# Patient Record
Sex: Female | Born: 1975 | Race: Black or African American | Hispanic: No | Marital: Single | Smoking: Never smoker
Health system: Southern US, Community
[De-identification: ages and names within clinical notes are randomized; demographics above are authoritative.]

## PROBLEM LIST (undated history)

## (undated) ENCOUNTER — Ambulatory Visit
Payer: MEDICARE | Attending: Student in an Organized Health Care Education/Training Program | Primary: Student in an Organized Health Care Education/Training Program

## (undated) ENCOUNTER — Encounter
Attending: Student in an Organized Health Care Education/Training Program | Primary: Student in an Organized Health Care Education/Training Program

## (undated) ENCOUNTER — Ambulatory Visit: Payer: Medicare (Managed Care)

## (undated) ENCOUNTER — Ambulatory Visit
Payer: Medicare (Managed Care) | Attending: Student in an Organized Health Care Education/Training Program | Primary: Student in an Organized Health Care Education/Training Program

## (undated) ENCOUNTER — Telehealth

## (undated) ENCOUNTER — Telehealth
Attending: Student in an Organized Health Care Education/Training Program | Primary: Student in an Organized Health Care Education/Training Program

## (undated) ENCOUNTER — Encounter

## (undated) ENCOUNTER — Encounter
Payer: Medicaid (Managed Care) | Attending: Student in an Organized Health Care Education/Training Program | Primary: Student in an Organized Health Care Education/Training Program

## (undated) ENCOUNTER — Other Ambulatory Visit

## (undated) ENCOUNTER — Ambulatory Visit: Payer: Medicare (Managed Care) | Attending: Registered" | Primary: Registered"

## (undated) ENCOUNTER — Ambulatory Visit: Payer: MEDICARE | Attending: Anesthesiology | Primary: Anesthesiology

## (undated) ENCOUNTER — Inpatient Hospital Stay: Payer: Medicare (Managed Care)

## (undated) ENCOUNTER — Ambulatory Visit

## (undated) ENCOUNTER — Encounter: Attending: Family | Primary: Family

## (undated) ENCOUNTER — Ambulatory Visit: Attending: Plastic and Reconstructive Surgery | Primary: Plastic and Reconstructive Surgery

## (undated) ENCOUNTER — Ambulatory Visit: Payer: Medicare (Managed Care) | Attending: Family | Primary: Family

## (undated) ENCOUNTER — Ambulatory Visit (HOSPITAL_COMMUNITY)

## (undated) ENCOUNTER — Ambulatory Visit: Admission: EM | Payer: Medicare Other | Source: Home / Self Care

## (undated) ENCOUNTER — Ambulatory Visit: Payer: Medicare Other

## (undated) DIAGNOSIS — I219 Acute myocardial infarction, unspecified: Secondary | ICD-10-CM

## (undated) DIAGNOSIS — E785 Hyperlipidemia, unspecified: Secondary | ICD-10-CM

## (undated) DIAGNOSIS — I10 Essential (primary) hypertension: Secondary | ICD-10-CM

## (undated) DIAGNOSIS — I509 Heart failure, unspecified: Secondary | ICD-10-CM

## (undated) DIAGNOSIS — I519 Heart disease, unspecified: Secondary | ICD-10-CM

## (undated) DIAGNOSIS — J449 Chronic obstructive pulmonary disease, unspecified: Secondary | ICD-10-CM

## (undated) DIAGNOSIS — E119 Type 2 diabetes mellitus without complications: Secondary | ICD-10-CM

## (undated) HISTORY — PX: CARDIAC SURGERY: SHX584

## (undated) HISTORY — DX: Hyperlipidemia, unspecified: E78.5

## (undated) HISTORY — PX: CORONARY ARTERY BYPASS GRAFT: SHX141

## (undated) HISTORY — PX: ABDOMINAL HYSTERECTOMY: SHX81

## (undated) HISTORY — DX: Heart disease, unspecified: I51.9

---

## 2020-05-29 LAB — HM DIABETES EYE EXAM

## 2020-10-11 ENCOUNTER — Inpatient Hospital Stay
Admission: EM | Admit: 2020-10-11 | Discharge: 2020-10-13 | DRG: 194 | Disposition: A | Discharge status: Home / Self Care | Payer: Medicare Other | Attending: Internal Medicine | Admitting: Internal Medicine

## 2020-10-11 ENCOUNTER — Emergency Department: Payer: Medicare Other

## 2020-10-11 ENCOUNTER — Other Ambulatory Visit: Payer: Self-pay

## 2020-10-11 DIAGNOSIS — I2510 Atherosclerotic heart disease of native coronary artery without angina pectoris: Secondary | ICD-10-CM | POA: Diagnosis not present

## 2020-10-11 DIAGNOSIS — Z20822 Contact with and (suspected) exposure to covid-19: Secondary | ICD-10-CM | POA: Diagnosis present

## 2020-10-11 DIAGNOSIS — J189 Pneumonia, unspecified organism: Secondary | ICD-10-CM | POA: Diagnosis present

## 2020-10-11 DIAGNOSIS — J9811 Atelectasis: Secondary | ICD-10-CM | POA: Diagnosis not present

## 2020-10-11 DIAGNOSIS — Z888 Allergy status to other drugs, medicaments and biological substances status: Secondary | ICD-10-CM | POA: Diagnosis not present

## 2020-10-11 DIAGNOSIS — E785 Hyperlipidemia, unspecified: Secondary | ICD-10-CM | POA: Diagnosis present

## 2020-10-11 DIAGNOSIS — Z7982 Long term (current) use of aspirin: Secondary | ICD-10-CM | POA: Diagnosis not present

## 2020-10-11 DIAGNOSIS — Y95 Nosocomial condition: Secondary | ICD-10-CM | POA: Diagnosis present

## 2020-10-11 DIAGNOSIS — Z79899 Other long term (current) drug therapy: Secondary | ICD-10-CM | POA: Diagnosis not present

## 2020-10-11 DIAGNOSIS — E876 Hypokalemia: Secondary | ICD-10-CM | POA: Diagnosis present

## 2020-10-11 DIAGNOSIS — I10 Essential (primary) hypertension: Secondary | ICD-10-CM | POA: Diagnosis present

## 2020-10-11 DIAGNOSIS — E1159 Type 2 diabetes mellitus with other circulatory complications: Secondary | ICD-10-CM

## 2020-10-11 DIAGNOSIS — K219 Gastro-esophageal reflux disease without esophagitis: Secondary | ICD-10-CM | POA: Diagnosis present

## 2020-10-11 DIAGNOSIS — E1141 Type 2 diabetes mellitus with diabetic mononeuropathy: Secondary | ICD-10-CM | POA: Diagnosis present

## 2020-10-11 DIAGNOSIS — E119 Type 2 diabetes mellitus without complications: Secondary | ICD-10-CM

## 2020-10-11 DIAGNOSIS — J440 Chronic obstructive pulmonary disease with acute lower respiratory infection: Secondary | ICD-10-CM | POA: Diagnosis present

## 2020-10-11 DIAGNOSIS — F419 Anxiety disorder, unspecified: Secondary | ICD-10-CM | POA: Diagnosis present

## 2020-10-11 DIAGNOSIS — J986 Disorders of diaphragm: Secondary | ICD-10-CM

## 2020-10-11 DIAGNOSIS — Z794 Long term (current) use of insulin: Secondary | ICD-10-CM

## 2020-10-11 DIAGNOSIS — J181 Lobar pneumonia, unspecified organism: Secondary | ICD-10-CM | POA: Diagnosis present

## 2020-10-11 DIAGNOSIS — Z951 Presence of aortocoronary bypass graft: Secondary | ICD-10-CM

## 2020-10-11 DIAGNOSIS — Z6841 Body Mass Index (BMI) 40.0 and over, adult: Secondary | ICD-10-CM

## 2020-10-11 DIAGNOSIS — Z7901 Long term (current) use of anticoagulants: Secondary | ICD-10-CM

## 2020-10-11 DIAGNOSIS — R1013 Epigastric pain: Secondary | ICD-10-CM

## 2020-10-11 HISTORY — DX: Chronic obstructive pulmonary disease, unspecified: J44.9

## 2020-10-11 HISTORY — DX: Type 2 diabetes mellitus without complications: E11.9

## 2020-10-11 HISTORY — DX: Essential (primary) hypertension: I10

## 2020-10-11 HISTORY — DX: Heart failure, unspecified: I50.9

## 2020-10-11 LAB — HEPATIC FUNCTION PANEL
ALT: 10 U/L (ref 0–44)
AST: 13 U/L — ABNORMAL LOW (ref 15–41)
Albumin: 3.7 g/dL (ref 3.5–5.0)
Alkaline Phosphatase: 60 U/L (ref 38–126)
Bilirubin, Direct: 0.2 mg/dL (ref 0.0–0.2)
Indirect Bilirubin: 1.1 mg/dL — ABNORMAL HIGH (ref 0.3–0.9)
Total Bilirubin: 1.3 mg/dL — ABNORMAL HIGH (ref 0.3–1.2)
Total Protein: 7.6 g/dL (ref 6.5–8.1)

## 2020-10-11 LAB — CBC
HCT: 32.9 % — ABNORMAL LOW (ref 36.0–46.0)
HCT: 29.1 % — ABNORMAL LOW (ref 36.0–46.0)
Hemoglobin: 11.4 g/dL — ABNORMAL LOW (ref 12.0–15.0)
Hemoglobin: 10 g/dL — ABNORMAL LOW (ref 12.0–15.0)
MCH: 30 pg (ref 26.0–34.0)
MCH: 30.1 pg (ref 26.0–34.0)
MCHC: 34.7 g/dL (ref 30.0–36.0)
MCHC: 34.4 g/dL (ref 30.0–36.0)
MCV: 86.6 fL (ref 80.0–100.0)
MCV: 87.7 fL (ref 80.0–100.0)
Platelets: 291 10*3/uL (ref 150–400)
Platelets: 241 10*3/uL (ref 150–400)
RBC: 3.8 MIL/uL — ABNORMAL LOW (ref 3.87–5.11)
RBC: 3.32 MIL/uL — ABNORMAL LOW (ref 3.87–5.11)
RDW: 15.3 % (ref 11.5–15.5)
RDW: 15.2 % (ref 11.5–15.5)
WBC: 10.4 10*3/uL (ref 4.0–10.5)
WBC: 8.8 10*3/uL (ref 4.0–10.5)
nRBC: 0 % (ref 0.0–0.2)
nRBC: 0 % (ref 0.0–0.2)

## 2020-10-11 LAB — BASIC METABOLIC PANEL
Anion gap: 9 (ref 5–15)
BUN: 12 mg/dL (ref 6–20)
CO2: 27 mmol/L (ref 22–32)
Calcium: 9.3 mg/dL (ref 8.9–10.3)
Chloride: 101 mmol/L (ref 98–111)
Creatinine, Ser: 0.85 mg/dL (ref 0.44–1.00)
GFR, Estimated: >60 mL/min (ref >60)
Glucose, Bld: 183 mg/dL — ABNORMAL HIGH (ref 70–99)
Potassium: 3.2 mmol/L — ABNORMAL LOW (ref 3.5–5.1)
Sodium: 137 mmol/L (ref 135–145)

## 2020-10-11 LAB — GLUCOSE, CAPILLARY: Glucose-Capillary: 142 mg/dL — ABNORMAL HIGH (ref 70–99)

## 2020-10-11 LAB — RESP PANEL BY RT-PCR (FLU A&B, COVID) ARPGX2
Influenza A by PCR: NEGATIVE
Influenza B by PCR: NEGATIVE
SARS Coronavirus 2 by RT PCR: NEGATIVE

## 2020-10-11 LAB — TROPONIN I (HIGH SENSITIVITY)
Troponin I (High Sensitivity): 31 ng/L — ABNORMAL HIGH (ref <18)
Troponin I (High Sensitivity): 31 ng/L — ABNORMAL HIGH (ref <18)

## 2020-10-11 LAB — CREATININE, SERUM
Creatinine, Ser: 0.84 mg/dL (ref 0.44–1.00)
GFR, Estimated: >60 mL/min (ref >60)

## 2020-10-11 LAB — LIPASE, BLOOD: Lipase: 22 U/L (ref 11–51)

## 2020-10-11 MED ORDER — MORPHINE SULFATE (PF) 2 MG/ML IV SOLN
2.0000 mg | INTRAVENOUS | Status: DC | PRN
  Administered 2020-10-11 – 2020-10-12 (×3): 2 mg via INTRAVENOUS
  Filled 2020-10-11 (×3): qty 1

## 2020-10-11 MED ORDER — SODIUM CHLORIDE 0.9 % IV SOLN
2.0000 g | INTRAVENOUS | Status: DC
  Administered 2020-10-11: 2 g via INTRAVENOUS
  Filled 2020-10-11: qty 20
  Filled 2020-10-11: qty 2

## 2020-10-11 MED ORDER — FAMOTIDINE IN NACL 20-0.9 MG/50ML-% IV SOLN
20.0000 mg | Freq: Once | INTRAVENOUS | Status: AC
  Administered 2020-10-11: 20 mg via INTRAVENOUS
  Filled 2020-10-11: qty 50

## 2020-10-11 MED ORDER — CLOPIDOGREL BISULFATE 75 MG PO TABS
75.0000 mg | ORAL_TABLET | Freq: Every day | ORAL | Status: DC
  Administered 2020-10-12 – 2020-10-13 (×2): 75 mg via ORAL
  Filled 2020-10-11 (×2): qty 1

## 2020-10-11 MED ORDER — FAMOTIDINE 20 MG PO TABS
20.0000 mg | ORAL_TABLET | Freq: Two times a day (BID) | ORAL | Status: DC
  Administered 2020-10-12 – 2020-10-13 (×3): 20 mg via ORAL
  Filled 2020-10-11 (×3): qty 1

## 2020-10-11 MED ORDER — CARVEDILOL 25 MG PO TABS
25.0000 mg | ORAL_TABLET | Freq: Two times a day (BID) | ORAL | Status: DC
  Administered 2020-10-12 – 2020-10-13 (×3): 25 mg via ORAL
  Filled 2020-10-11 (×3): qty 1

## 2020-10-11 MED ORDER — ENOXAPARIN SODIUM 60 MG/0.6ML IJ SOSY
0.5000 mg/kg | PREFILLED_SYRINGE | INTRAMUSCULAR | Status: DC
  Administered 2020-10-11: 55 mg via SUBCUTANEOUS
  Filled 2020-10-11 (×2): qty 0.6

## 2020-10-11 MED ORDER — ONDANSETRON HCL 4 MG/2ML IJ SOLN
4.0000 mg | Freq: Once | INTRAMUSCULAR | Status: AC
  Administered 2020-10-11: 4 mg via INTRAVENOUS
  Filled 2020-10-11: qty 2

## 2020-10-11 MED ORDER — VANCOMYCIN HCL 1250 MG/250ML IV SOLN
1250.0000 mg | Freq: Once | INTRAVENOUS | Status: AC
  Administered 2020-10-11: 1250 mg via INTRAVENOUS
  Filled 2020-10-11: qty 250

## 2020-10-11 MED ORDER — ASPIRIN 81 MG PO CHEW
81.0000 mg | CHEWABLE_TABLET | Freq: Every day | ORAL | Status: DC
  Administered 2020-10-12 – 2020-10-13 (×2): 81 mg via ORAL
  Filled 2020-10-11 (×2): qty 1

## 2020-10-11 MED ORDER — INSULIN ASPART 100 UNIT/ML IJ SOLN
2.0000 [IU] | Freq: Three times a day (TID) | INTRAMUSCULAR | Status: DC
  Administered 2020-10-12 (×3): 2 [IU] via SUBCUTANEOUS
  Filled 2020-10-11 (×3): qty 1

## 2020-10-11 MED ORDER — MORPHINE SULFATE (PF) 4 MG/ML IV SOLN
6.0000 mg | Freq: Once | INTRAVENOUS | Status: AC
  Administered 2020-10-11: 6 mg via INTRAVENOUS
  Filled 2020-10-11: qty 2

## 2020-10-11 MED ORDER — POTASSIUM CHLORIDE CRYS ER 20 MEQ PO TBCR
40.0000 meq | EXTENDED_RELEASE_TABLET | Freq: Once | ORAL | Status: AC
  Administered 2020-10-11: 40 meq via ORAL
  Filled 2020-10-11: qty 2

## 2020-10-11 MED ORDER — IOHEXOL 350 MG/ML SOLN
75.0000 mL | Freq: Once | INTRAVENOUS | Status: AC | PRN
  Administered 2020-10-11: 75 mL via INTRAVENOUS

## 2020-10-11 MED ORDER — INSULIN ASPART 100 UNIT/ML IJ SOLN
0.0000 [IU] | Freq: Three times a day (TID) | INTRAMUSCULAR | Status: DC
  Administered 2020-10-12: 1 [IU] via SUBCUTANEOUS
  Administered 2020-10-12: 2 [IU] via SUBCUTANEOUS
  Administered 2020-10-12: 1 [IU] via SUBCUTANEOUS
  Administered 2020-10-13: 3 [IU] via SUBCUTANEOUS
  Filled 2020-10-11 (×4): qty 1

## 2020-10-11 MED ORDER — EZETIMIBE 10 MG PO TABS
10.0000 mg | ORAL_TABLET | Freq: Every day | ORAL | Status: DC
  Administered 2020-10-12 – 2020-10-13 (×2): 10 mg via ORAL
  Filled 2020-10-11 (×2): qty 1

## 2020-10-11 MED ORDER — ATORVASTATIN CALCIUM 20 MG PO TABS
80.0000 mg | ORAL_TABLET | Freq: Every day | ORAL | Status: DC
  Administered 2020-10-12 – 2020-10-13 (×2): 80 mg via ORAL
  Filled 2020-10-11 (×2): qty 4

## 2020-10-11 MED ORDER — RANOLAZINE ER 500 MG PO TB12
1000.0000 mg | ORAL_TABLET | Freq: Two times a day (BID) | ORAL | Status: DC
  Administered 2020-10-12 – 2020-10-13 (×3): 1000 mg via ORAL
  Filled 2020-10-11 (×5): qty 2

## 2020-10-11 MED ORDER — ACETAMINOPHEN 325 MG PO TABS
650.0000 mg | ORAL_TABLET | Freq: Four times a day (QID) | ORAL | Status: DC | PRN
  Administered 2020-10-12 – 2020-10-13 (×2): 650 mg via ORAL
  Filled 2020-10-11 (×2): qty 2

## 2020-10-11 MED ORDER — PANTOPRAZOLE SODIUM 40 MG IV SOLR
40.0000 mg | Freq: Once | INTRAVENOUS | Status: AC
  Administered 2020-10-11: 40 mg via INTRAVENOUS
  Filled 2020-10-11: qty 40

## 2020-10-11 MED ORDER — SENNA 8.6 MG PO TABS
1.0000 | ORAL_TABLET | Freq: Every day | ORAL | Status: DC
  Administered 2020-10-12: 8.6 mg via ORAL
  Filled 2020-10-11: qty 1

## 2020-10-11 MED ORDER — SODIUM CHLORIDE 0.9 % IV SOLN
2.0000 g | Freq: Once | INTRAVENOUS | Status: AC
  Administered 2020-10-11: 2 g via INTRAVENOUS
  Filled 2020-10-11: qty 2

## 2020-10-11 MED ORDER — HYDROXYZINE HCL 10 MG PO TABS
10.0000 mg | ORAL_TABLET | Freq: Three times a day (TID) | ORAL | Status: DC | PRN
  Administered 2020-10-12: 10 mg via ORAL
  Filled 2020-10-11 (×2): qty 1

## 2020-10-11 MED ORDER — ALBUTEROL SULFATE (2.5 MG/3ML) 0.083% IN NEBU
2.5000 mg | INHALATION_SOLUTION | Freq: Four times a day (QID) | RESPIRATORY_TRACT | Status: DC | PRN
  Administered 2020-10-12: 2.5 mg via RESPIRATORY_TRACT
  Filled 2020-10-11: qty 3

## 2020-10-11 MED ORDER — BUMETANIDE 1 MG PO TABS
2.0000 mg | ORAL_TABLET | Freq: Two times a day (BID) | ORAL | Status: DC
  Administered 2020-10-12 – 2020-10-13 (×3): 2 mg via ORAL
  Filled 2020-10-11 (×4): qty 2

## 2020-10-11 MED ORDER — CARVEDILOL 25 MG PO TABS
25.0000 mg | ORAL_TABLET | Freq: Two times a day (BID) | ORAL | Status: DC
  Administered 2020-10-11: 25 mg via ORAL
  Filled 2020-10-11: qty 1

## 2020-10-11 MED ORDER — PANTOPRAZOLE SODIUM 40 MG PO TBEC
40.0000 mg | DELAYED_RELEASE_TABLET | Freq: Two times a day (BID) | ORAL | Status: DC
  Administered 2020-10-12 – 2020-10-13 (×3): 40 mg via ORAL
  Filled 2020-10-11 (×3): qty 1

## 2020-10-11 MED ORDER — AZITHROMYCIN 500 MG IV SOLR
500.0000 mg | INTRAVENOUS | Status: DC
Start: 2020-10-11 — End: 2020-10-12
  Administered 2020-10-12: 500 mg via INTRAVENOUS
  Filled 2020-10-11 (×2): qty 500

## 2020-10-11 MED ORDER — MAGNESIUM OXIDE -MG SUPPLEMENT 400 (240 MG) MG PO TABS
400.0000 mg | ORAL_TABLET | Freq: Every day | ORAL | Status: DC
  Administered 2020-10-12 – 2020-10-13 (×2): 400 mg via ORAL
  Filled 2020-10-11 (×2): qty 1

## 2020-10-11 MED ORDER — ALBUTEROL SULFATE HFA 108 (90 BASE) MCG/ACT IN AERS: 2.0000 | INHALATION_SPRAY | Freq: Four times a day (QID) | RESPIRATORY_TRACT | Status: DC | PRN

## 2020-10-11 NOTE — ED Provider Notes (Signed)
[facility] Emergency Department Provider Note ____________________________________________   Event Date/Time   First MD Initiated Contact with Patient 10/11/20 1327     (approximate)  I have reviewed the triage vital signs and the nursing notes.  HISTORY  Chief Complaint Chest Pain and Shortness of Breath   HPI Kayla Caldwell is a 45 y.o. femalewho presents to the ED for evaluation of chest pain, SOB  Chart review indicates recent CABG x4 on 6/21 at [facility]. Associated postop seroma requiring drain, since removed.  Patient just finished cefpodoxime 3 weeks ago. DAPT w plavix, HTN, HLD, DM on insulin, GERD.   Patient presents to the ED for evaluation of 1 day of spasming upper abdominal/lower chest pain.  She reports a cough with minimal sputum production over the past couple days.  She reports pain to her upper abdomen at the midline with associated nausea and poor p.o. intake without emesis.  Denies diarrhea or stool changes, denies lower abdominal pain or dysuria.  Denies any substernal chest pain or radiation of the pain, such as to her back.  Denies fevers, syncopal episodes or falls.  Past Medical History:  Diagnosis Date   COPD (chronic obstructive pulmonary disease) ([place])    Diabetes ([place])    Heart failure ([place])    Hypertension     There are no problems to display for this patient.    Prior to Admission medications   Not on File    Allergies Compazine [prochlorperazine], Naproxen, Nitroglycerin, Toradol [ketorolac tromethamine], and Tramadol  No family history on file.  Social History    Review of Systems  Constitutional: No fever/chills.  Positive generalized weakness and poor appetite. Eyes: No visual changes. ENT: No sore throat. Cardiovascular: Denies chest pain. Respiratory: Positive for cough Gastrointestinal: Positive for abdominal pain and nausea  no vomiting.  No diarrhea.  No constipation. Genitourinary: Negative for  dysuria. Musculoskeletal: Negative for back pain. Skin: Negative for rash. Neurological: Negative for headaches, focal weakness or numbness.  ____________________________________________   PHYSICAL EXAM:  VITAL SIGNS: Vitals:   10/11/20 1504 10/11/20 1530  BP: (!) 203/111 (!) 178/108  Pulse: 76 88  Resp: 20 (!) 28  Temp:    SpO2: 96% 97%    Constitutional: Alert and oriented.  Obese.  Appears quite uncomfortable.  A couple times during my evaluation she has the spasms of pain that doubles her over and she reports severe epigastric/pain around her xiphoid. Eyes: Conjunctivae are normal. PERRL. EOMI. Head: Atraumatic. Nose: No congestion/rhinnorhea. Mouth/Throat: Mucous membranes are moist.  Oropharynx non-erythematous. Neck: No stridor. No cervical spine tenderness to palpation. Cardiovascular: Normal rate, regular rhythm. Grossly normal heart sounds.  Good peripheral circulation. Respiratory: Tachypneic to about 30 without further signs of distress.  Respiratory crackles of the left base is noted, otherwise clear.  No wheezing. Gastrointestinal: Soft , nondistended,. No CVA tenderness. Epigastric tenderness to palpation with voluntary guarding.  Lower abdomen is benign. Musculoskeletal: No lower extremity tenderness nor edema.  No joint effusions. No signs of acute trauma. Neurologic:  Normal speech and language. No gross focal neurologic deficits are appreciated.  Skin:  Skin is warm, dry and intact. No rash noted. Psychiatric: Mood and affect are normal. Speech and behavior are normal. ____________________________________________   LABS (all labs ordered are listed, but only abnormal results are displayed)  Labs Reviewed  BASIC METABOLIC PANEL - Abnormal; Notable for the following components:      Result Value   Potassium 3.2 (*)    Glucose,  Bld 183 (*)    All other components within normal limits  CBC - Abnormal; Notable for the following components:   RBC 3.80 (*)     Hemoglobin 11.4 (*)    HCT 32.9 (*)    All other components within normal limits  HEPATIC FUNCTION PANEL - Abnormal; Notable for the following components:   AST 13 (*)    Total Bilirubin 1.3 (*)    Indirect Bilirubin 1.1 (*)    All other components within normal limits  TROPONIN I (HIGH SENSITIVITY) - Abnormal; Notable for the following components:   Troponin I (High Sensitivity) 31 (*)    All other components within normal limits  RESP PANEL BY RT-PCR (FLU A&B, COVID) ARPGX2  LIPASE, BLOOD  POC URINE PREG, ED  TROPONIN I (HIGH SENSITIVITY)   ____________________________________________  12 Lead EKG Sinus rhythm with a rate of 82 bpm.  Normal axis.  Prolonged QTC at 502 ms and otherwise normal intervals.  No STEMI.  Some nonspecific ST changes laterally and inferiorly.  No comparison ____________________________________________  RADIOLOGY  ED MD interpretation: 2 view CXR reviewed by me with LLL opacity superimposed on cardiomegaly  Official radiology report(s): DG Chest 2 View  Result Date: 10/11/2020 CLINICAL DATA:  Shortness of breath, chest pain. EXAM: CHEST - 2 VIEW COMPARISON:  None. FINDINGS: Mild cardiomegaly. Central pulmonary vascular congestion and mild bilateral interstitial prominence, presumably interstitial edema. Dense opacity at the LEFT lung base. No pneumothorax is seen. Median sternotomy wires appear intact and appropriately positioned. No acute-appearing osseous abnormality. IMPRESSION: 1. Cardiomegaly with mild CHF/volume overload. 2. Dense opacity at the LEFT lung base, atelectasis versus pneumonia, with probable small pleural effusion. Electronically Signed   By: Franki Cabot M.D.   On: 10/11/2020 13:47   CT ABDOMEN PELVIS W CONTRAST  Result Date: 10/11/2020 CLINICAL DATA:  Severe epigastric pain, emesis, chest pain. Evaluate for small bowel obstruction. EXAM: CT ABDOMEN AND PELVIS WITH CONTRAST TECHNIQUE: Multidetector CT imaging of the abdomen and pelvis  was performed using the standard protocol following bolus administration of intravenous contrast. CONTRAST:  55m OMNIPAQUE IOHEXOL 350 MG/ML SOLN COMPARISON:  None. FINDINGS: Lower chest: Dense consolidation at the LEFT lung base, with surrounding small pleural effusion. Additional small RIGHT pleural effusion. Small pericardial effusion, incompletely imaged. Hepatobiliary: No focal liver abnormality is seen. Gallbladder is slightly dense, possibly containing sludge. No pericholecystic fluid seen. No bile duct dilatation seen. Pancreas: Unremarkable.  No peripancreatic fluid. Spleen: Normal in size without focal abnormality. Adrenals/Urinary Tract: Adrenal glands are unremarkable. Kidneys are unremarkable without mass, stone or hydronephrosis. No ureteral or bladder calculi identified. Bladder is unremarkable, partially decompressed. Stomach/Bowel: No dilated large or small bowel loops. No evidence of bowel wall inflammation. Appendix is normal. Stomach is unremarkable. Vascular/Lymphatic: Aortic atherosclerosis. No acute-appearing vascular abnormality. No enlarged lymph nodes seen in the abdomen or pelvis. Reproductive: Presumed hysterectomy.  No adnexal mass or free fluid. Other: No free fluid or abscess collection. No free intraperitoneal air. Musculoskeletal: Mild degenerative spondylosis of the thoracolumbar spine. No acute-appearing osseous abnormality. IMPRESSION: 1. Dense consolidation at the LEFT lung base. This could represent pneumonia, aspiration or atelectasis. Small bilateral pleural effusions. 2. Small pericardial effusion, incompletely imaged. 3. No acute findings within the abdomen or pelvis. No bowel obstruction or evidence of bowel wall inflammation. No evidence of acute solid organ abnormality. No renal or ureteral calculi. Appendix is normal. Aortic Atherosclerosis (ICD10-I70.0). Electronically Signed   By: SFranki CabotM.D.   On: 10/11/2020 15:34  ____________________________________________   PROCEDURES and INTERVENTIONS  Procedure(s) performed (including Critical Care):  .1-3 Lead EKG Interpretation  Date/Time: 10/11/2020 2:55 PM Performed by: Vladimir Crofts, MD Authorized by: Vladimir Crofts, MD     Interpretation: normal     ECG rate:  80   ECG rate assessment: normal     Rhythm: sinus rhythm     Ectopy: none     Conduction: normal    Medications  ceFEPIme (MAXIPIME) 2 g in sodium chloride 0.9 % 100 mL IVPB (has no administration in time range)  vancomycin (VANCOREADY) IVPB 1250 mg/250 mL (has no administration in time range)  carvedilol (COREG) tablet 25 mg (25 mg Oral Given 10/11/20 1505)  ondansetron (ZOFRAN) injection 4 mg (4 mg Intravenous Given 10/11/20 1353)  morphine 4 MG/ML injection 6 mg (6 mg Intravenous Given 10/11/20 1440)  pantoprazole (PROTONIX) injection 40 mg (40 mg Intravenous Given 10/11/20 1445)  famotidine (PEPCID) IVPB 20 mg premix (0 mg Intravenous Stopped 10/11/20 1535)  iohexol (OMNIPAQUE) 350 MG/ML injection 75 mL (75 mLs Intravenous Contrast Given 10/11/20 1523)    ____________________________________________   MDM / ED COURSE   Pleasant 45 year old woman about 2 months out from recent CABG presents to the ED with chest/epigastric pain as well as shortness of breath and cough, with evidence of HCAP and required medical admission.  She is quite tachypneic, but has no hypoxia.  Does have crackles to her left base, otherwise clear lungs without wheezing.  Significant epigastric tenderness to palpation without peritoneal features.  Lower abdomen is benign.  Blood work without leukocytosis or significant acute derangements.  EKG is nonischemic and no evidence of ACS.  No evidence of pancreatitis.  Due to abdominal tenderness, CT obtained and demonstrates no evidence of acute intra-abdominal pathology, and redemonstrates left lower lobe pulmonary infiltrate as the likely etiology of her symptoms.  I suspect  diaphragmatic inflammation and irritation from this infiltrate contributing to her poorly localizing pain.  Due to recent antibiotic courses and admissions related to her CABG and associated complications, she qualifies for HCAP and would likely benefit from medical admission and IV antibiotics.  Clinical Course as of 10/11/20 1539  Sun Oct 11, 2020  1451 Discussed the patient my concern for HCAP considering her recent antibiotic courses and admissions.  We discussed need for IV antibiotics and we discussed possible coexisting intra-abdominal pathology and need for CT scan to better elucidate this.  She is in agreement with plan of care.  Just requesting pain medications due to her significant discomfort. [DS]  [id number] Reassessed.  Improving pain after morphine and H2 blockers.  Awaiting CT abdomen/pelvis.  Patient will be signed out to oncoming provider to follow-up on the study [DS]    Clinical Course User Index [DS] Vladimir Crofts, MD    ____________________________________________   FINAL CLINICAL IMPRESSION(S) / ED DIAGNOSES  Final diagnoses:  HCAP (healthcare-associated pneumonia)  Epigastric pain     ED Discharge Orders     None        Gladys Gutman Tamala Julian   Note:  This document was prepared using Dragon voice recognition software and may include unintentional dictation errors.    Vladimir Crofts, MD 10/11/20 [phone]

## 2020-10-11 NOTE — ED Notes (Signed)
Pt in xray. Janace Hoard, EDT made Leonette Most, RN aware.

## 2020-10-11 NOTE — ED Notes (Signed)
Hospitalist at bedside 

## 2020-10-11 NOTE — ED Notes (Signed)
IV attempted x1 unsuccessful

## 2020-10-11 NOTE — Progress Notes (Signed)
PHARMACIST - PHYSICIAN COMMUNICATION  CONCERNING:  Enoxaparin (Lovenox) for DVT Prophylaxis    RECOMMENDATION: Patient was prescribed enoxaprin '40mg'$  q24 hours for VTE prophylaxis.   Filed Weights   10/11/20 1307  Weight: 108.9 kg (240 lb)    Body mass index is 41.85 kg/m.  Estimated Creatinine Clearance: 97.8 mL/min (by C-G formula based on SCr of 0.85 mg/dL).   Based on [place] patient is candidate for enoxaparin 0.'5mg'$ /kg TBW SQ every 24 hours based on BMI being >30.  DESCRIPTION: Pharmacy has adjusted enoxaparin dose per [facility] policy.  Patient is now receiving enoxaparin 0.5 mg/hr every 24 hours   Renda Rolls, PharmD, [facility] 10/11/2020 10:48 PM

## 2020-10-11 NOTE — ED Triage Notes (Addendum)
Pt arrives via pov with c/o chest pain starting around 0530. Non radiating where tube was placed previously for cabag completed June 21st, 2022. Tube was removed prior to d/c from hospital after procedure. Pt reports difficulty and pain when taking deep breath. Reports nausea.

## 2020-10-11 NOTE — ED Notes (Signed)
Ct aware pt had her pain meds

## 2020-10-11 NOTE — ED Notes (Signed)
Pt comes with c/o substernal pain. Pt denies any CP or SOb. Pt states recent CABG in June. Pt states where the tube was it is painful and sore to touch.

## 2020-10-11 NOTE — ED Notes (Signed)
Pt resting in bed watching TV. Family at bedside

## 2020-10-11 NOTE — H&P (Signed)
History and Physical  Kayla Caldwell [id number] DOB: [birth year] DOA: 10/11/2020  Referring physician: Dr. Tamala Julian PCP: Pcp, No  Outpatient Specialists: None Patient coming from: Home & is able to ambulate   Chief Complaint: Chest pain with shortness of breath  HPI: Kayla Caldwell is a 45 y.o. female with medical history significant for coronary artery disease status post CABG x4 on August 18, 2020 at [facility], history of postop seroma that was drained, and she had just completed her antibiotics 3 weeks ago with cefpodoxime, COPD, heart failure, hypertension, diabetes melitis on insulin, GERD, hyperlipidemia,  DAPT with Plavix, who presented to the emergency department having relocated to [facility] from [place] with 1 day history of spasm of the upper abdomen and chest pain.  This was associated with cough with minimal sputum production over the past couple of days.  She complained of upper abdominal pain that was associated with nausea and anorexia denies any hematemesis denies any emesis diarrhea or stool changes.  She denies any fever or syncopal episode.  ED Course: Patient was noted to have blood pressure of 203/111 in the ED CT scan of the chest was done and it showed left basilar infiltrate patient was started on vancomycin and ceftriaxone.  She was also started on oxygen at 2 L  Review of Systems: . Pt complains of chest pain shortness of breath review of systems are otherwise negative   Past Medical History:  Diagnosis Date   COPD (chronic obstructive pulmonary disease) (HCC)    Diabetes ([place])    Heart failure ([place])    Hypertension     Social History:  has no history on file for tobacco use, alcohol use, and drug use.   Allergies  Allergen Reactions   Compazine [Prochlorperazine]    Naproxen    Nitroglycerin    Toradol [Ketorolac Tromethamine]    Tramadol     No family history on file.    Prior to Admission medications    Medication Sig Start Date End Date Taking? Authorizing Provider  albuterol (VENTOLIN HFA) 108 (90 Base) MCG/ACT inhaler Inhale 2 puffs into the lungs every 6 (six) hours as needed for wheezing or shortness of breath.   Yes [provider]  aspirin 81 MG chewable tablet Chew 81 mg by mouth daily.   Yes [provider]  atorvastatin (LIPITOR) 80 MG tablet Take 80 mg by mouth daily.   Yes [provider]  bumetanide (BUMEX) 2 MG tablet Take 2 mg by mouth 2 (two) times daily.   Yes [provider]  carvedilol (COREG) 25 MG tablet Take 25 mg by mouth 2 (two) times daily with a meal.   Yes [provider]  clopidogrel (PLAVIX) 75 MG tablet Take 75 mg by mouth daily.   Yes [provider]  ezetimibe (ZETIA) 10 MG tablet Take 10 mg by mouth daily.   Yes [provider]  famotidine (PEPCID) 20 MG tablet Take 20 mg by mouth 2 (two) times daily.   Yes [provider]  hydrOXYzine (ATARAX/VISTARIL) 10 MG tablet Take 10 mg by mouth every 8 (eight) hours as needed for anxiety, itching or sleep.   Yes [provider]  insulin glargine (LANTUS) 100 UNIT/ML injection Inject 5 Units into the skin at bedtime.   Yes [provider]  insulin lispro (HUMALOG) 100 UNIT/ML injection Inject 2 Units into the skin 3 (three) times daily before meals.   Yes [provider]  magnesium oxide (MAG-OX) 400  MG tablet Take 400 mg by mouth daily.   Yes [provider]  pantoprazole (PROTONIX) 40 MG tablet Take 40 mg by mouth 2 (two) times daily.   Yes [provider]  potassium chloride (MICRO-K) 10 MEQ CR capsule Take 10 mEq by mouth daily.   Yes [provider]  ranolazine (RANEXA) 1000 MG SR tablet Take 1,000 mg by mouth 2 (two) times daily.   Yes [provider]  senna (SENOKOT) 8.6 MG TABS tablet Take 1 tablet by mouth at bedtime.   Yes [provider]    Physical Exam: BP (!)  155/106   Pulse 74   Temp 99.1 F (37.3 C) (Oral)   Resp 19   Ht 5' 3.5" (1.613 m)   Wt 108.9 kg   SpO2 100%   BMI 41.85 kg/m   Exam:  General: 45 y.o. year-old female well developed well nourished in no acute distress.  Alert and oriented x3.  Overweight Cardiovascular: Regular rate and rhythm with no rubs or gallops.  No thyromegaly or JVD noted.   Respiratory: Clear to auscultation with no wheezes or rales. Good inspiratory effort. Abdomen: Soft nontender nondistended with normal bowel sounds x4 quadrants. Musculoskeletal: No lower extremity edema. 2/4 pulses in all 4 extremities. Skin: No ulcerative lesions noted or rashes, midsternotomy scar in place and healing Psychiatry: Mood is appropriate for condition and setting           Labs on Admission:  Basic Metabolic Panel: Recent Labs  Lab 10/11/20 1335  NA 137  K 3.2*  CL 101  CO2 27  GLUCOSE 183*  BUN 12  CREATININE 0.85  CALCIUM 9.3   Liver Function Tests: Recent Labs  Lab 10/11/20 1335  AST 13*  ALT 10  ALKPHOS 60  BILITOT 1.3*  PROT 7.6  ALBUMIN 3.7   Recent Labs  Lab 10/11/20 1335  LIPASE 22   No results for input(s): AMMONIA in the last 168 hours. CBC: Recent Labs  Lab 10/11/20 1335  WBC 10.4  HGB 11.4*  HCT 32.9*  MCV 86.6  PLT 291   Cardiac Enzymes: No results for input(s): CKTOTAL, CKMB, CKMBINDEX, TROPONINI in the last 168 hours.  BNP (last 3 results) No results for input(s): BNP in the last 8760 hours.  ProBNP (last 3 results) No results for input(s): PROBNP in the last 8760 hours.  CBG: No results for input(s): GLUCAP in the last 168 hours.  Radiological Exams on Admission: DG Chest 2 View  Result Date: 10/11/2020 CLINICAL DATA:  Shortness of breath, chest pain. EXAM: CHEST - 2 VIEW COMPARISON:  None. FINDINGS: Mild cardiomegaly. Central pulmonary vascular congestion and mild bilateral interstitial prominence, presumably interstitial edema. Dense opacity at the LEFT lung  base. No pneumothorax is seen. Median sternotomy wires appear intact and appropriately positioned. No acute-appearing osseous abnormality. IMPRESSION: 1. Cardiomegaly with mild CHF/volume overload. 2. Dense opacity at the LEFT lung base, atelectasis versus pneumonia, with probable small pleural effusion. Electronically Signed   By: Franki Cabot M.D.   On: 10/11/2020 13:47   CT ABDOMEN PELVIS W CONTRAST  Result Date: 10/11/2020 CLINICAL DATA:  Severe epigastric pain, emesis, chest pain. Evaluate for small bowel obstruction. EXAM: CT ABDOMEN AND PELVIS WITH CONTRAST TECHNIQUE: Multidetector CT imaging of the abdomen and pelvis was performed using the standard protocol following bolus administration of intravenous contrast. CONTRAST:  53m OMNIPAQUE IOHEXOL 350 MG/ML SOLN COMPARISON:  None. FINDINGS: Lower chest: Dense consolidation at the LEFT lung base, with surrounding  small pleural effusion. Additional small RIGHT pleural effusion. Small pericardial effusion, incompletely imaged. Hepatobiliary: No focal liver abnormality is seen. Gallbladder is slightly dense, possibly containing sludge. No pericholecystic fluid seen. No bile duct dilatation seen. Pancreas: Unremarkable.  No peripancreatic fluid. Spleen: Normal in size without focal abnormality. Adrenals/Urinary Tract: Adrenal glands are unremarkable. Kidneys are unremarkable without mass, stone or hydronephrosis. No ureteral or bladder calculi identified. Bladder is unremarkable, partially decompressed. Stomach/Bowel: No dilated large or small bowel loops. No evidence of bowel wall inflammation. Appendix is normal. Stomach is unremarkable. Vascular/Lymphatic: Aortic atherosclerosis. No acute-appearing vascular abnormality. No enlarged lymph nodes seen in the abdomen or pelvis. Reproductive: Presumed hysterectomy.  No adnexal mass or free fluid. Other: No free fluid or abscess collection. No free intraperitoneal air. Musculoskeletal: Mild degenerative  spondylosis of the thoracolumbar spine. No acute-appearing osseous abnormality. IMPRESSION: 1. Dense consolidation at the LEFT lung base. This could represent pneumonia, aspiration or atelectasis. Small bilateral pleural effusions. 2. Small pericardial effusion, incompletely imaged. 3. No acute findings within the abdomen or pelvis. No bowel obstruction or evidence of bowel wall inflammation. No evidence of acute solid organ abnormality. No renal or ureteral calculi. Appendix is normal. Aortic Atherosclerosis (ICD10-I70.0). Electronically Signed   By: Franki Cabot M.D.   On: 10/11/2020 15:34    EKG: Independently reviewed.  Normal sinus rhythm rate is 80 no STEMI  Assessment/Plan Present on Admission:  Lobar pneumonia (HCC)  CAD (coronary artery disease), native coronary artery  Hypertension  Pneumonia  Principal Problem:   Lobar pneumonia (HCC) Active Problems:   CAD (coronary artery disease), native coronary artery   Hx of CABG   Diabetes (HCC)   Hypertension   Pneumonia   1.  Community-acquired pneumonia.  Patient started on vancomycin and ceftriaxone oxygenation is good at 2 L/min  2.  Uncontrolled hypertension with malignant hypertension blood pressure is slightly better We will start her on home medicine Continue to monitor  3.  Coronary artery disease status post CABG 2 months ago.  Patient had chest pain but seem to be more atypical due to her pneumonia we will continue on her medications  4.  Type 2 diabetes mellitus patient is on NovoLog which we will continue.  She stated that she is also on Lantus but lately she has tended to have low sugars in the evening or early morning so hold hold up the Lantus we will just continue sliding scale  5.  GERD.  Patient has severe GERD she is actually on a PPI and H2 blocker. Will continue and we ordered per home  6.  Hyperlipidemia continue Zetia  7.  Anxiety continue hydroxyzine  8.  Hypokalemia.  This is mild at 3.2 we will  replace with 40 mEq.  She is on magnesium Severity of Illness: The appropriate patient status for this patient is INPATIENT. Inpatient status is judged to be reasonable and necessary in order to provide the required intensity of service to ensure the patient's safety. The patient's presenting symptoms, physical exam findings, and initial radiographic and laboratory data in the context of their chronic comorbidities is felt to place them at high risk for further clinical deterioration. Furthermore, it is not anticipated that the patient will be medically stable for discharge from the hospital within 2 midnights of admission. The following factors support the patient status of inpatient.   " Patient pneumonia with high risk of complication status post CABG  * I certify that at the point of admission it is my  clinical judgment that the patient will require inpatient hospital care spanning beyond 2 midnights from the point of admission due to high intensity of service, high risk for further deterioration and high frequency of surveillance required.*   DVT prophylaxis: Lovenox  Code Status: Full  Family Communication: Patient and another family member at bedside  Disposition Plan: Home when stable  Consults called: None  Admission status: Inpatient    Cristal Deer MD [facility] Pager [phone]  If 7PM-7AM, please contact night-coverage [email or web address] Password [facility]  10/11/2020, 6:17 PM

## 2020-10-11 NOTE — ED Notes (Signed)
Pt c/o stomach pain, reviewed admission orders, no pain meds ordered. Acid meds previously given and too soon to give more. Admitting provider messaged for pt request.

## 2020-10-11 NOTE — Plan of Care (Signed)

## 2020-10-11 NOTE — ED Provider Notes (Signed)
-----------------------------------------   3:05 PM on 10/11/2020 -----------------------------------------  Blood pressure (!) 203/111, pulse 76, temperature 99.1 F (37.3 C), temperature source Oral, resp. rate 20, height 5' 3.5" (1.613 m), weight 108.9 kg, SpO2 96 %.  Assuming care from Dr. Tamala Julian.  In short, Kayla Caldwell is a 45 y.o. female with a chief complaint of Chest Pain and Shortness of Breath .  Refer to the original H&P for additional details.  The current plan of care is to follow-up CT scan, plan for admission for PNA.  ----------------------------------------- 3:58 PM on 10/11/2020 ----------------------------------------- CT scan read demonstrates dense consolidation in the left lower lobe, concerning for pneumonia.  Patient had been treated with cefepime and vancomycin per Dr. Tamala Julian, case discussed with hospitalist for admission.    Blake Divine, MD 10/11/20 1558

## 2020-10-12 ENCOUNTER — Inpatient Hospital Stay: Payer: Medicare Other

## 2020-10-12 DIAGNOSIS — J9811 Atelectasis: Secondary | ICD-10-CM

## 2020-10-12 DIAGNOSIS — Z794 Long term (current) use of insulin: Secondary | ICD-10-CM

## 2020-10-12 DIAGNOSIS — I10 Essential (primary) hypertension: Secondary | ICD-10-CM

## 2020-10-12 DIAGNOSIS — E1159 Type 2 diabetes mellitus with other circulatory complications: Secondary | ICD-10-CM

## 2020-10-12 DIAGNOSIS — I2510 Atherosclerotic heart disease of native coronary artery without angina pectoris: Secondary | ICD-10-CM

## 2020-10-12 LAB — MRSA NEXT GEN BY PCR, NASAL: MRSA by PCR Next Gen: NOT DETECTED

## 2020-10-12 LAB — HEMOGLOBIN A1C
Hgb A1c MFr Bld: 6.9 % — ABNORMAL HIGH (ref 4.8–5.6)
Mean Plasma Glucose: 151.33 mg/dL

## 2020-10-12 LAB — PROCALCITONIN: Procalcitonin: <0.1 ng/mL

## 2020-10-12 LAB — GLUCOSE, CAPILLARY: Glucose-Capillary: 130 mg/dL — ABNORMAL HIGH (ref 70–99)

## 2020-10-12 LAB — HIV ANTIBODY (ROUTINE TESTING W REFLEX): HIV Screen 4th Generation wRfx: NONREACTIVE

## 2020-10-12 MED ORDER — INSULIN GLARGINE-YFGN 100 UNIT/ML ~~LOC~~ SOLN
5.0000 [IU] | Freq: Every day | SUBCUTANEOUS | Status: DC
  Administered 2020-10-13: 5 [IU] via SUBCUTANEOUS
  Filled 2020-10-12 (×2): qty 0.05

## 2020-10-12 MED ORDER — IBUPROFEN 400 MG PO TABS: 400.0000 mg | ORAL_TABLET | Freq: Four times a day (QID) | ORAL | Status: DC | PRN

## 2020-10-12 MED ORDER — HYDROCODONE-ACETAMINOPHEN 5-325 MG PO TABS
2.0000 | ORAL_TABLET | Freq: Once | ORAL | Status: AC
  Administered 2020-10-12: 2 via ORAL
  Filled 2020-10-12: qty 2

## 2020-10-12 MED ORDER — ZOLPIDEM TARTRATE 5 MG PO TABS
5.0000 mg | ORAL_TABLET | Freq: Once | ORAL | Status: AC
  Administered 2020-10-12: 5 mg via ORAL
  Filled 2020-10-12: qty 1

## 2020-10-12 MED ORDER — INSULIN GLARGINE-YFGN 100 UNIT/ML ~~LOC~~ SOLN: 5.0000 [IU] | Freq: Every day | SUBCUTANEOUS | Status: DC

## 2020-10-12 NOTE — Progress Notes (Signed)
[facility] [place] NAME: Kayla Caldwell    MR#:  [id number]  DATE OF BIRTH:  1975-08-13  SUBJECTIVE:    REVIEW OF SYSTEMS:   ROS Tolerating Diet: Tolerating PT:   DRUG ALLERGIES:   Allergies  Allergen Reactions   Compazine [Prochlorperazine]    Naproxen    Nitroglycerin    Toradol [Ketorolac Tromethamine]    Tramadol     VITALS:  Blood pressure (!) 152/75, pulse 81, temperature 99.2 F (37.3 C), resp. rate 18, height '5\' 3"'$  (1.6 m), weight 113 kg, SpO2 99 %.  PHYSICAL EXAMINATION:   Physical Exam  GENERAL:  45 y.o.-year-old patient lying in the bed with no acute distress.  HEENT: Head atraumatic, normocephalic. Oropharynx and nasopharynx clear.  NECK:  Supple, no jugular venous distention. No thyroid enlargement, no tenderness.  LUNGS: Normal breath sounds bilaterally, no wheezing, rales, rhonchi. No use of accessory muscles of respiration.  CARDIOVASCULAR: S1, S2 normal. No murmurs, rubs, or gallops.  ABDOMEN: Soft, nontender, nondistended. Bowel sounds present. No organomegaly or mass.  EXTREMITIES: No cyanosis, clubbing or edema b/l.    NEUROLOGIC: Cranial nerves II through XII are intact. No focal Motor or sensory deficits b/l.   PSYCHIATRIC:  patient is alert and oriented x 3.  SKIN: No obvious rash, lesion, or ulcer.   LABORATORY PANEL:  CBC Recent Labs  Lab 10/11/20 2246  WBC 8.8  HGB 10.0*  HCT 29.1*  PLT 241    Chemistries  Recent Labs  Lab 10/11/20 1335 10/11/20 2246  NA 137  --   K 3.2*  --   CL 101  --   CO2 27  --   GLUCOSE 183*  --   BUN 12  --   CREATININE 0.85 0.84  CALCIUM 9.3  --   AST 13*  --   ALT 10  --   ALKPHOS 60  --   BILITOT 1.3*  --    Cardiac Enzymes No results for input(s): TROPONINI in the last 168 hours. RADIOLOGY:  DG Chest 2 View  Result Date: 10/11/2020 CLINICAL DATA:  Shortness of breath, chest pain. EXAM: CHEST - 2 VIEW COMPARISON:  None. FINDINGS: Mild  cardiomegaly. Central pulmonary vascular congestion and mild bilateral interstitial prominence, presumably interstitial edema. Dense opacity at the LEFT lung base. No pneumothorax is seen. Median sternotomy wires appear intact and appropriately positioned. No acute-appearing osseous abnormality. IMPRESSION: 1. Cardiomegaly with mild CHF/volume overload. 2. Dense opacity at the LEFT lung base, atelectasis versus pneumonia, with probable small pleural effusion. Electronically Signed   By: Franki Cabot M.D.   On: 10/11/2020 13:47   CT ABDOMEN PELVIS W CONTRAST  Result Date: 10/11/2020 CLINICAL DATA:  Severe epigastric pain, emesis, chest pain. Evaluate for small bowel obstruction. EXAM: CT ABDOMEN AND PELVIS WITH CONTRAST TECHNIQUE: Multidetector CT imaging of the abdomen and pelvis was performed using the standard protocol following bolus administration of intravenous contrast. CONTRAST:  44m OMNIPAQUE IOHEXOL 350 MG/ML SOLN COMPARISON:  None. FINDINGS: Lower chest: Dense consolidation at the LEFT lung base, with surrounding small pleural effusion. Additional small RIGHT pleural effusion. Small pericardial effusion, incompletely imaged. Hepatobiliary: No focal liver abnormality is seen. Gallbladder is slightly dense, possibly containing sludge. No pericholecystic fluid seen. No bile duct dilatation seen. Pancreas: Unremarkable.  No peripancreatic fluid. Spleen: Normal in size without focal abnormality. Adrenals/Urinary Tract: Adrenal glands are unremarkable. Kidneys are unremarkable without mass, stone or hydronephrosis. No ureteral or bladder calculi identified.  Bladder is unremarkable, partially decompressed. Stomach/Bowel: No dilated large or small bowel loops. No evidence of bowel wall inflammation. Appendix is normal. Stomach is unremarkable. Vascular/Lymphatic: Aortic atherosclerosis. No acute-appearing vascular abnormality. No enlarged lymph nodes seen in the abdomen or pelvis. Reproductive: Presumed  hysterectomy.  No adnexal mass or free fluid. Other: No free fluid or abscess collection. No free intraperitoneal air. Musculoskeletal: Mild degenerative spondylosis of the thoracolumbar spine. No acute-appearing osseous abnormality. IMPRESSION: 1. Dense consolidation at the LEFT lung base. This could represent pneumonia, aspiration or atelectasis. Small bilateral pleural effusions. 2. Small pericardial effusion, incompletely imaged. 3. No acute findings within the abdomen or pelvis. No bowel obstruction or evidence of bowel wall inflammation. No evidence of acute solid organ abnormality. No renal or ureteral calculi. Appendix is normal. Aortic Atherosclerosis (ICD10-I70.0). Electronically Signed   By: Franki Cabot M.D.   On: 10/11/2020 15:34   ASSESSMENT AND PLAN:    Kayla Caldwell is a 45 y.o. female with medical history significant for coronary artery disease status post CABG x4 on August 18, 2020 at [place], history of postop seroma that was drained, and she had just completed her antibiotics 3 weeks ago with cefpodoxime, COPD, heart failure, hypertension, diabetes melitis on insulin, GERD, hyperlipidemia,  DAPT with Plavix, who presented to the emergency department having relocated to [facility] from [place] with 1 day history of spasm of the upper abdomen and chest pain.  Chest pain with shortness of breath-- transient resolved abnormal chest x-ray as compared to x-rays done at [facility] likely due to left hemi diaphragm paralysis post CABG versus atelectasis -- patient clinically does not have sign symptoms of pneumonia -- white count normal, no fever, no shortness of breath, Pro calcitonin normal -- discontinue antibiotics -- seen by [facility]-- agrees with plan, sniff test ordered  Uncontrolled hypertension  -- continue Coreg  Coronary artery disease status post CABG 2 months ago.   --Patient had chest pain but seem to be more atypical which she describes as  spasmodic  pain when she had her chest tube during CABG --resolved -- continue cardiac meds  Type 2 diabetes mellitus, mild hypoglycemia with CAD -- will resume home regimen of insulin  GERD.   --cont PPI and H2 blocker.   Hyperlipidemia  --continue Zetia  Anxiety  --continue hydroxyzine   Family communication : daughter at bedside Consults : pulmonary CODE STATUS: full DVT Prophylaxis : Lovenox Level of care: Med-Surg Status is: Inpatient    Dispo: The patient is from: home              Anticipated d/c is to: Home              Patient currently is medically stable to d/c.   Difficult to place patient No  patient overall is hemodynamically stable. Will continue to monitor one more day and discharge in a.m. if she remains stable. Patient is in agreement with plan.      TOTAL TIME TAKING CARE OF THIS PATIENT: 25 minutes.  >50% time spent on counselling and coordination of care  Note: This dictation was prepared with Dragon dictation along with smaller phrase technology. Any transcriptional errors that result from this process are unintentional.  Fritzi Mandes M.D    [facility]   CC: Primary care physician; Pcp, No Patient ID: Modest Dubicki, female   DOB: May 24, 1975, 45 y.o.   MRN: [id number]

## 2020-10-12 NOTE — Progress Notes (Signed)
Patient prefers to self-check her blood sugars. 1610 reading is 137.

## 2020-10-12 NOTE — Consult Note (Signed)
Pulmonary Medicine          Date: 10/12/2020,   MRN# [id number] Kayla Caldwell 1975-09-30     AdmissionWeight: 108.9 kg                 CurrentWeight: 113 kg  Refering physician: Dr Posey Pronto    CHIEF COMPLAINT:   Left lung pneumonia refractory to outpatient antibiotics.    HISTORY OF PRESENT ILLNESS   Kayla Caldwell is a 45 y.o. female with medical history significant for coronary artery disease status post CABG x4 on August 18, 2020 at [place], history of postop seroma that was drained, and she had just completed her antibiotics 3 weeks ago with cefpodoxime, COPD, heart failure, hypertension, diabetes melitis on insulin, GERD, hyperlipidemia,  DAPT with Plavix, who presented to the emergency department having relocated to [facility] from [place] with 1 day history of spasm of the upper abdomen and chest pain.  This was associated with cough with minimal sputum production over the past couple of days.  She complained of upper abdominal pain that was associated with nausea and anorexia denies any hematemesis denies any emesis diarrhea or stool changes.  She denies any fever or syncopal episode.  Patient shares that she had substantial pain after removal of chest tube.    During examination patient shares she is breathing at baseline and not dyspenic.   PAST MEDICAL HISTORY   Past Medical History:  Diagnosis Date   COPD (chronic obstructive pulmonary disease) ([place])    Diabetes ([place])    Heart failure ([place])    Hypertension      SURGICAL HISTORY   History reviewed. No pertinent surgical history.   FAMILY HISTORY   History reviewed. No pertinent family history.   SOCIAL HISTORY   Social History   Tobacco Use   Smoking status: Never   Smokeless tobacco: Never     MEDICATIONS    Home Medication:    Current Medication:  Current Facility-Administered Medications:    acetaminophen (TYLENOL) tablet 650 mg, 650 mg, Oral,  Q6H PRN, Cristal Deer, MD   albuterol (PROVENTIL) (2.5 MG/3ML) 0.083% nebulizer solution 2.5 mg, 2.5 mg, Nebulization, Q6H PRN, Renda Rolls, RPH   aspirin chewable tablet 81 mg, 81 mg, Oral, Daily, Cristal Deer, MD, 81 mg at 10/12/20 0909   atorvastatin (LIPITOR) tablet 80 mg, 80 mg, Oral, Daily, Cristal Deer, MD, 80 mg at 10/12/20 0908   azithromycin (ZITHROMAX) 500 mg in sodium chloride 0.9 % 250 mL IVPB, 500 mg, Intravenous, Q24H, Cristal Deer, MD, Stopped at 10/12/20 0104   bumetanide (BUMEX) tablet 2 mg, 2 mg, Oral, BID, Cristal Deer, MD, 2 mg at 10/12/20 0817   carvedilol (COREG) tablet 25 mg, 25 mg, Oral, BID WC, Cristal Deer, MD, 25 mg at 10/12/20 0818   cefTRIAXone (ROCEPHIN) 2 g in sodium chloride 0.9 % 100 mL IVPB, 2 g, Intravenous, Q24H, Cristal Deer, MD, Stopped at 10/12/20 0003   clopidogrel (PLAVIX) tablet 75 mg, 75 mg, Oral, Daily, Cristal Deer, MD, 75 mg at 10/12/20 0908   enoxaparin (LOVENOX) injection 55 mg, 0.5 mg/kg, Subcutaneous, Q24H, Cristal Deer, MD, 55 mg at 10/11/20 2329   ezetimibe (ZETIA) tablet 10 mg, 10 mg, Oral, Daily, Cristal Deer, MD, 10 mg at 10/12/20 0909   famotidine (PEPCID) tablet 20 mg, 20 mg, Oral, BID, Cristal Deer, MD, 20 mg at 10/12/20 0908   hydrOXYzine (ATARAX/VISTARIL) tablet 10 mg, 10 mg, Oral, Q8H PRN, Cristal Deer, MD, 10  mg at 10/12/20 0909   insulin aspart (novoLOG) injection 0-9 Units, 0-9 Units, Subcutaneous, TID WC, Cristal Deer, MD, 2 Units at 10/12/20 1201   insulin aspart (novoLOG) injection 2 Units, 2 Units, Subcutaneous, TID WC, Cristal Deer, MD, 2 Units at 10/12/20 1200   magnesium oxide (MAG-OX) tablet 400 mg, 400 mg, Oral, Daily, Cristal Deer, MD, 400 mg at 10/12/20 [id number]   morphine 2 MG/ML injection 2 mg, 2 mg, Intravenous, Q4H PRN, Mansy, Jan A, MD, 2 mg at 10/12/20 [id number]   pantoprazole (PROTONIX) EC tablet 40 mg, 40 mg, Oral, BID, Cristal Deer, MD, 40 mg at 10/12/20  0908   ranolazine (RANEXA) 12 hr tablet 1,000 mg, 1,000 mg, Oral, BID, Cristal Deer, MD, 1,000 mg at 10/12/20 0908   senna (SENOKOT) tablet 8.6 mg, 1 tablet, Oral, QHS, Cristal Deer, MD    ALLERGIES   Compazine [prochlorperazine], Naproxen, Nitroglycerin, Toradol [ketorolac tromethamine], and Tramadol     REVIEW OF SYSTEMS    Review of Systems:  Gen:  Denies  fever, sweats, chills weigh loss  HEENT: Denies blurred vision, double vision, ear pain, eye pain, hearing loss, nose bleeds, sore throat Cardiac:  No dizziness, chest pain or heaviness, chest tightness,edema Resp:   Denies cough or sputum porduction, shortness of breath,wheezing, hemoptysis,  Gi: Denies swallowing difficulty, stomach pain, nausea or vomiting, diarrhea, constipation, bowel incontinence Gu:  Denies bladder incontinence, burning urine Ext:   Denies Joint pain, stiffness or swelling Skin: Denies  skin rash, easy bruising or bleeding or hives Endoc:  Denies polyuria, polydipsia , polyphagia or weight change Psych:   Denies depression, insomnia or hallucinations   Other:  All other systems negative   VS: BP (!) 152/75   Pulse 81   Temp 99.2 F (37.3 C)   Resp 18   Ht '5\' 3"'$  (1.6 m)   Wt 113 kg   SpO2 99%   BMI 44.13 kg/m      PHYSICAL EXAM    GENERAL:NAD, no fevers, chills, no weakness no fatigue HEAD: Normocephalic, atraumatic.  EYES: Pupils equal, round, reactive to light. Extraocular muscles intact. No scleral icterus.  MOUTH: Moist mucosal membrane. Dentition intact. No abscess noted.  EAR, NOSE, THROAT: Clear without exudates. No external lesions.  NECK: Supple. No thyromegaly. No nodules. No JVD.  PULMONARY: Diffuse coarse rhonchi right sided +wheezes CARDIOVASCULAR: S1 and S2. Regular rate and rhythm. No murmurs, rubs, or gallops. No edema. Pedal pulses 2+ bilaterally.  GASTROINTESTINAL: Soft, nontender, nondistended. No masses. Positive bowel sounds. No hepatosplenomegaly.   MUSCULOSKELETAL: No swelling, clubbing, or edema. Range of motion full in all extremities.  NEUROLOGIC: Cranial nerves II through XII are intact. No gross focal neurological deficits. Sensation intact. Reflexes intact.  SKIN: No ulceration, lesions, rashes, or cyanosis. Skin warm and dry. Turgor intact.  PSYCHIATRIC: Mood, affect within normal limits. The patient is awake, alert and oriented x 3. Insight, judgment intact.       IMAGING    DG Chest 2 View  Result Date: 10/11/2020 CLINICAL DATA:  Shortness of breath, chest pain. EXAM: CHEST - 2 VIEW COMPARISON:  None. FINDINGS: Mild cardiomegaly. Central pulmonary vascular congestion and mild bilateral interstitial prominence, presumably interstitial edema. Dense opacity at the LEFT lung base. No pneumothorax is seen. Median sternotomy wires appear intact and appropriately positioned. No acute-appearing osseous abnormality. IMPRESSION: 1. Cardiomegaly with mild CHF/volume overload. 2. Dense opacity at the LEFT lung base, atelectasis versus pneumonia, with probable small pleural effusion. Electronically Signed   By: Cherlynn Kaiser  Enriqueta Shutter M.D.   On: 10/11/2020 13:47   CT ABDOMEN PELVIS W CONTRAST  Result Date: 10/11/2020 CLINICAL DATA:  Severe epigastric pain, emesis, chest pain. Evaluate for small bowel obstruction. EXAM: CT ABDOMEN AND PELVIS WITH CONTRAST TECHNIQUE: Multidetector CT imaging of the abdomen and pelvis was performed using the standard protocol following bolus administration of intravenous contrast. CONTRAST:  46m OMNIPAQUE IOHEXOL 350 MG/ML SOLN COMPARISON:  None. FINDINGS: Lower chest: Dense consolidation at the LEFT lung base, with surrounding small pleural effusion. Additional small RIGHT pleural effusion. Small pericardial effusion, incompletely imaged. Hepatobiliary: No focal liver abnormality is seen. Gallbladder is slightly dense, possibly containing sludge. No pericholecystic fluid seen. No bile duct dilatation seen. Pancreas:  Unremarkable.  No peripancreatic fluid. Spleen: Normal in size without focal abnormality. Adrenals/Urinary Tract: Adrenal glands are unremarkable. Kidneys are unremarkable without mass, stone or hydronephrosis. No ureteral or bladder calculi identified. Bladder is unremarkable, partially decompressed. Stomach/Bowel: No dilated large or small bowel loops. No evidence of bowel wall inflammation. Appendix is normal. Stomach is unremarkable. Vascular/Lymphatic: Aortic atherosclerosis. No acute-appearing vascular abnormality. No enlarged lymph nodes seen in the abdomen or pelvis. Reproductive: Presumed hysterectomy.  No adnexal mass or free fluid. Other: No free fluid or abscess collection. No free intraperitoneal air. Musculoskeletal: Mild degenerative spondylosis of the thoracolumbar spine. No acute-appearing osseous abnormality. IMPRESSION: 1. Dense consolidation at the LEFT lung base. This could represent pneumonia, aspiration or atelectasis. Small bilateral pleural effusions. 2. Small pericardial effusion, incompletely imaged. 3. No acute findings within the abdomen or pelvis. No bowel obstruction or evidence of bowel wall inflammation. No evidence of acute solid organ abnormality. No renal or ureteral calculi. Appendix is normal. Aortic Atherosclerosis (ICD10-I70.0). Electronically Signed   By: SFranki CabotM.D.   On: 10/11/2020 15:34      ASSESSMENT/PLAN   Left lung infiltrate/pneumonia  - there is atelectasis of LLL with left hemidiaphragm elevation - this may be transient post operatively due to inflammatory changes vs due to phrenic nerve injury post operatively   - Sniff test today    - spirometry with graph with RT  - Patient is improved clinically post antibiotics  -patient is working with PT home      Thank you for allowing me to participate in the care of this patient.  Total face to face encounter time for this patient visit was >45 min. >50% of the time was  spent in counseling and  coordination of care.   Patient/Family are satisfied with care plan and all questions have been answered.  This document was prepared using Dragon voice recognition software and may include unintentional dictation errors.     FOttie Glazier M.D.  [facility]

## 2020-10-12 NOTE — Progress Notes (Signed)
Patient Kayla Caldwell machine states blood glucose is 216.

## 2020-10-13 LAB — GLUCOSE, CAPILLARY: Glucose-Capillary: 181 mg/dL — ABNORMAL HIGH (ref 70–99)

## 2020-10-13 NOTE — Plan of Care (Signed)
  Problem: Education: Goal: Knowledge of General Education information will improve Description: Including pain rating scale, medication(s)/side effects and non-pharmacologic comfort measures Outcome: Adequate for Discharge   Problem: Health Behavior/Discharge Planning: Goal: Ability to manage health-related needs will improve Outcome: Adequate for Discharge   Problem: Clinical Measurements: Goal: Ability to maintain clinical measurements within normal limits will improve Outcome: Adequate for Discharge Goal: Will remain free from infection Outcome: Adequate for Discharge Goal: Diagnostic test results will improve Outcome: Adequate for Discharge Goal: Respiratory complications will improve Outcome: Adequate for Discharge Goal: Cardiovascular complication will be avoided Outcome: Adequate for Discharge   Problem: Activity: Goal: Risk for activity intolerance will decrease Outcome: Adequate for Discharge   Problem: Nutrition: Goal: Adequate nutrition will be maintained Outcome: Adequate for Discharge   Problem: Nutrition: Goal: Adequate nutrition will be maintained Outcome: Adequate for Discharge   Problem: Coping: Goal: Level of anxiety will decrease Outcome: Adequate for Discharge   Problem: Elimination: Goal: Will not experience complications related to bowel motility Outcome: Adequate for Discharge Goal: Will not experience complications related to urinary retention Outcome: Adequate for Discharge

## 2020-10-13 NOTE — Discharge Summary (Signed)
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 female with medical history significant for coronary artery disease status post CABG x4 on August 18, 2020 at [facility], history of postop seroma that was drained, and she had just completed her antibiotics 3 weeks ago with cefpodoxime, COPD, heart failure, hypertension, diabetes melitis on insulin, GERD, hyperlipidemia,  DAPT with Plavix, who presented to the emergency department having relocated to [place] from [place] with 1 day history of spasm of the upper abdomen and chest pain.   Chest pain with shortness of breath-- transient resolved abnormal chest x-ray as compared to x-rays done at [facility] likely due to left diaphragm paralysis post CABG versus atelectasis -- patient clinically does not have sign symptoms of pneumonia -- white count normal, no fever, no shortness of breath, Pro calcitonin normal -- discontinue antibiotics -- seen by [facility]-- agrees with plan, sniff test ordered --8/16-- Sniff test s/o left diaphragm paralysis/phrenic nerve damage Incentive spirometer  Uncontrolled hypertension  -- continue Coreg  Coronary artery disease status post CABG 2 months ago.   --Patient had chest pain but seem to  be more atypical which she describes as spasmodic  pain when she had her chest tube during CABG --resolved -- continue cardiac meds  Type 2 diabetes mellitus, mild hypoglycemia with CAD -- resumed home regimen of insulin  GERD.   --cont PPI and H2 blocker.    Hyperlipidemia  --continue Zetia  Anxiety  --continue hydroxyzine     Family communication : daughter at bedside on 8/15 Consults : pulmonary CODE STATUS: full DVT Prophylaxis : Lovenox Level of care: Med-Surg Status is: Inpatient       Dispo: The patient is from: home              Anticipated d/c is to: Home              Patient currently is medically stable to d/c.              Difficult to place patient No   patient overall is hemodynamically stable.  Pt will d/c home with out pt f/u PCP  CONSULTS OBTAINED:  Treatment Team:  Ottie Glazier, MD  DRUG ALLERGIES:   Allergies  Allergen Reactions   Compazine [Prochlorperazine]    Naproxen    Nitroglycerin    Toradol [Ketorolac Tromethamine]    Tramadol     DISCHARGE MEDICATIONS:   Allergies as of 10/13/2020       Reactions   Compazine [prochlorperazine]    Naproxen    Nitroglycerin    Toradol [ketorolac Tromethamine]    Tramadol         Medication List     TAKE these medications    albuterol 108 (90 Base) MCG/ACT inhaler Commonly known as: VENTOLIN HFA Inhale  2 puffs into the lungs every 6 (six) hours as needed for wheezing or shortness of breath.   aspirin 81 MG chewable tablet Chew 81 mg by mouth daily.   atorvastatin 80 MG tablet Commonly known as: LIPITOR Take 80 mg by mouth daily.   bumetanide 2 MG tablet Commonly known as: BUMEX Take 2 mg by mouth 2 (two) times daily.   carvedilol 25 MG tablet Commonly known as: COREG Take 25 mg by mouth 2 (two) times daily with a meal.   clopidogrel 75 MG tablet Commonly known as: PLAVIX Take 75 mg by mouth daily.   ezetimibe 10 MG tablet Commonly known as: ZETIA Take 10 mg by mouth  daily.   famotidine 20 MG tablet Commonly known as: PEPCID Take 20 mg by mouth 2 (two) times daily.   hydrOXYzine 10 MG tablet Commonly known as: ATARAX/VISTARIL Take 10 mg by mouth every 8 (eight) hours as needed for anxiety, itching or sleep.   insulin glargine 100 UNIT/ML injection Commonly known as: LANTUS Inject 5 Units into the skin at bedtime.   insulin lispro 100 UNIT/ML injection Commonly known as: HUMALOG Inject 2 Units into the skin 3 (three) times daily before meals.   magnesium oxide 400 MG tablet Commonly known as: MAG-OX Take 400 mg by mouth daily.   pantoprazole 40 MG tablet Commonly known as: PROTONIX Take 40 mg by mouth 2 (two) times daily.   potassium chloride 10 MEQ CR capsule Commonly known as: MICRO-K Take 10 mEq by mouth daily.   ranolazine 1000 MG SR tablet Commonly known as: RANEXA Take 1,000 mg by mouth 2 (two) times daily.   senna 8.6 MG Tabs tablet Commonly known as: SENOKOT Take 1 tablet by mouth at bedtime.        If you experience worsening of your admission symptoms, develop shortness of breath, life threatening emergency, suicidal or homicidal thoughts you must seek medical attention immediately by calling 911 or calling your MD immediately  if symptoms less severe.  You Must read complete instructions/literature along with all the possible adverse reactions/side effects for all the Medicines you take and that have been prescribed to you. Take any new Medicines after you have completely understood and accept all the possible adverse reactions/side effects.   Please note  You were cared for by a hospitalist during your hospital stay. If you have any questions about your discharge medications or the care you received while you were in the hospital after you are discharged, you can call the unit and asked to speak with the hospitalist on call if the hospitalist that took care of you is not available. Once you are discharged, your primary  care physician will handle any further medical issues. Please note that NO REFILLS for any discharge medications will be authorized once you are discharged, as it is imperative that you return to your primary care physician (or establish a relationship with a primary care physician if you do not have one) for your aftercare needs so that they can reassess your need for medications and monitor your lab values. Today   SUBJECTIVE   Cough with whitish phlegm. No fever  VITAL SIGNS:  Blood pressure (!) 145/97, pulse 83, temperature 98.3 F (36.8 C), temperature source Oral, resp. rate 18, height '5\' 3"'$  (1.6 m), weight 113 kg, SpO2 100 %.  I/O:   Intake/Output Summary (Last 24 hours) at 10/13/2020 0816 Last data filed at 10/13/2020 0601 Gross per 24 hour  Intake 361.46 ml  Output 1600 ml  Net -1238.54 ml    PHYSICAL EXAMINATION:  GENERAL:  45 y.o.-year-old patient lying in the bed with no acute distress. obese LUNGS: Normal breath sounds bilaterally, no wheezing, rales,rhonchi or crepitation. No use of accessory muscles of respiration. CABG scar+ CARDIOVASCULAR: S1, S2 normal. No murmurs, rubs, or gallops.  ABDOMEN: Soft, non-tender, non-distended. Bowel sounds present. No organomegaly or mass.  EXTREMITIES: No pedal edema, cyanosis, or clubbing.  NEUROLOGIC: non focal PSYCHIATRIC: The patient is alert and oriented x 3.  SKIN: No obvious rash, lesion, or ulcer.   DATA REVIEW:   CBC  Recent Labs  Lab 10/11/20 2246  WBC 8.8  HGB 10.0*  HCT 29.1*  PLT 241    Chemistries  Recent Labs  Lab 10/11/20 1335 10/11/20 2246  NA 137  --   K 3.2*  --   CL 101  --   CO2 27  --   GLUCOSE 183*  --   BUN 12  --   CREATININE 0.85 0.84  CALCIUM 9.3  --   AST 13*  --   ALT 10  --   ALKPHOS 60  --   BILITOT 1.3*  --     Microbiology Results   Recent Results (from the past 240 hour(s))  Resp Panel by RT-PCR (Flu A&B, Covid) Nasopharyngeal Swab     Status: None   Collection Time:  10/11/20  3:43 PM   Specimen: Nasopharyngeal Swab; Nasopharyngeal(NP) swabs in vial transport medium  Result Value Ref Range Status   SARS Coronavirus 2 by RT PCR NEGATIVE NEGATIVE Final    Comment: (NOTE) SARS-CoV-2 target nucleic acids are NOT DETECTED.  The SARS-CoV-2 RNA is generally detectable in upper respiratory specimens during the acute phase of infection. The lowest concentration of SARS-CoV-2 viral copies this assay can detect is 138 copies/mL. A negative result does not preclude SARS-Cov-2 infection and should not be used as the sole basis for treatment or other patient management decisions. A negative result may occur with  improper specimen collection/handling, submission of specimen other than nasopharyngeal swab, presence of viral mutation(s) within the areas targeted by this assay, and inadequate number of viral copies(<138 copies/mL). A negative result must be combined with clinical observations, patient history, and epidemiological information. The expected result is Negative.  Fact Sheet for Patients:  EntrepreneurPulse.com.au  Fact Sheet for Healthcare Providers:  IncredibleEmployment.be  This test is no t yet approved or cleared by the [place] FDA and  has been authorized for detection and/or diagnosis of SARS-CoV-2 by FDA under an Emergency Use Authorization (EUA). This EUA will remain  in effect (meaning this test can be used) for the duration of the COVID-19 declaration under Section 564(b)(1) of the Act, 21 U.S.C.section 360bbb-3(b)(1), unless the authorization is terminated  or revoked sooner.       Influenza A by PCR NEGATIVE NEGATIVE Final   Influenza B by PCR NEGATIVE NEGATIVE Final    Comment: (NOTE) The Xpert Xpress SARS-CoV-2/FLU/RSV plus assay is intended as an aid in the diagnosis of influenza from Nasopharyngeal swab specimens and should not be used as a sole basis for treatment. Nasal washings  and aspirates are unacceptable for Xpert Xpress SARS-CoV-2/FLU/RSV testing.  Fact Sheet for Patients: EntrepreneurPulse.com.au  Fact Sheet for Healthcare Providers: IncredibleEmployment.be  This test is not yet approved or cleared by the [place] FDA and has been authorized for detection and/or diagnosis of SARS-CoV-2 by FDA under an Emergency Use Authorization (EUA). This EUA will remain in  effect (meaning this test can be used) for the duration of the COVID-19 declaration under Section 564(b)(1) of the Act, 21 U.S.C. section 360bbb-3(b)(1), unless the authorization is terminated or revoked.  Performed at [facility], [address] [place], [place] [address]   MRSA Next Gen by PCR, Nasal     Status: None   Collection Time: 10/12/20  9:41 AM   Specimen: Nasal Mucosa; Nasal Swab  Result Value Ref Range Status   MRSA by PCR Next Gen NOT DETECTED NOT DETECTED Final    Comment: (NOTE) The GeneXpert MRSA Assay (FDA approved for NASAL specimens only), is one component of a comprehensive MRSA colonization surveillance program. It is not intended to diagnose MRSA infection nor to guide or monitor treatment for MRSA infections. Test performance is not FDA approved in patients less than 58 years old. Performed at [facility], [address], [place], [place] [address]     RADIOLOGY:  DG Chest 2 View  Result Date: 10/11/2020 CLINICAL DATA:  Shortness of breath, chest pain. EXAM: CHEST - 2 VIEW COMPARISON:  None. FINDINGS: Mild cardiomegaly. Central pulmonary vascular congestion and mild bilateral interstitial prominence, presumably interstitial edema. Dense opacity at the LEFT lung base. No pneumothorax is seen. Median sternotomy wires appear intact and appropriately positioned. No acute-appearing osseous abnormality. IMPRESSION: 1. Cardiomegaly with mild CHF/volume overload. 2. Dense opacity at the LEFT lung base,  atelectasis versus pneumonia, with probable small pleural effusion. Electronically Signed   By: Franki Cabot M.D.   On: 10/11/2020 13:47   CT ABDOMEN PELVIS W CONTRAST  Result Date: 10/11/2020 CLINICAL DATA:  Severe epigastric pain, emesis, chest pain. Evaluate for small bowel obstruction. EXAM: CT ABDOMEN AND PELVIS WITH CONTRAST TECHNIQUE: Multidetector CT imaging of the abdomen and pelvis was performed using the standard protocol following bolus administration of intravenous contrast. CONTRAST:  57m OMNIPAQUE IOHEXOL 350 MG/ML SOLN COMPARISON:  None. FINDINGS: Lower chest: Dense consolidation at the LEFT lung base, with surrounding small pleural effusion. Additional small RIGHT pleural effusion. Small pericardial effusion, incompletely imaged. Hepatobiliary: No focal liver abnormality is seen. Gallbladder is slightly dense, possibly containing sludge. No pericholecystic fluid seen. No bile duct dilatation seen. Pancreas: Unremarkable.  No peripancreatic fluid. Spleen: Normal in size without focal abnormality. Adrenals/Urinary Tract: Adrenal glands are unremarkable. Kidneys are unremarkable without mass, stone or hydronephrosis. No ureteral or bladder calculi identified. Bladder is unremarkable, partially decompressed. Stomach/Bowel: No dilated large or small bowel loops. No evidence of bowel wall inflammation. Appendix is normal. Stomach is unremarkable. Vascular/Lymphatic: Aortic atherosclerosis. No acute-appearing vascular abnormality. No enlarged lymph nodes seen in the abdomen or pelvis. Reproductive: Presumed hysterectomy.  No adnexal mass or free fluid. Other: No free fluid or abscess collection. No free intraperitoneal air. Musculoskeletal: Mild degenerative spondylosis of the thoracolumbar spine. No acute-appearing osseous abnormality. IMPRESSION: 1. Dense consolidation at the LEFT lung base. This could represent pneumonia, aspiration or atelectasis. Small bilateral pleural effusions. 2. Small  pericardial effusion, incompletely imaged. 3. No acute findings within the abdomen or pelvis. No bowel obstruction or evidence of bowel wall inflammation. No evidence of acute solid organ abnormality. No renal or ureteral calculi. Appendix is normal. Aortic Atherosclerosis (ICD10-I70.0). Electronically Signed   By: SFranki CabotM.D.   On: 10/11/2020 15:34   DG Sniff Test  Result Date: 10/12/2020 CLINICAL DATA:  Downward displacement of right hemidiaphragm EXAM: CHEST FLUOROSCOPY TECHNIQUE: Real-time fluoroscopic evaluation of the chest was performed. FLUOROSCOPY TIME:  Fluoroscopy Time:  36 seconds Radiation Exposure  Index (if provided by the fluoroscopic device): 6.0 mGy Number of Acquired Spot Images: 0 COMPARISON:  None. FINDINGS: Scout radiograph demonstrates a median sternotomy and postsurgical changes of coronary artery bypass. There are small bilateral pleural effusions. The left hemidiaphragm is elevated. Excursion during normal quiet breathing is approximately 0.5 cm (normal 1.0-2.5 cm). Excursion during deep breathing is approximately 1.0 cm (normal 3.6-9.2 cm). Paradoxical motion of the left hemidiaphragm was observed during rapid inspiration (sniffs). IMPRESSION: Poor left-sided diaphragmatic excursion with paradoxical motion, consistent with diaphragmatic paralysis/nerve palsy. Normal right hemidiaphragm function. Small bilateral pleural effusions. Electronically Signed   By: Maurine Simmering M.D.   On: 10/12/2020 14:42     CODE STATUS:     Code Status Orders  (From admission, onward)           Start     Ordered   10/11/20 2237  Full code  Continuous        10/11/20 2236           Code Status History     This patient has a current code status but no historical code status.        TOTAL TIME TAKING CARE OF THIS PATIENT: 35 minutes.    Fritzi Mandes M.D  [facility]    CC: Primary care physician; Cletis Athens, MD

## 2020-10-13 NOTE — Progress Notes (Signed)
Discharge instructions, RX's and follow up appt explained and provided to patient, verbalized understanding. Patent left floor via wheelchair accompanied by volunteers. No c/o pain or shortness of breath. Patient has personal belongings including cell phone at d/c.  Madina Galati, Tivis Ringer, RN

## 2020-10-13 NOTE — Progress Notes (Signed)
Blood glucose is 176

## 2020-10-27 ENCOUNTER — Other Ambulatory Visit: Payer: Self-pay

## 2020-10-27 ENCOUNTER — Ambulatory Visit (INDEPENDENT_AMBULATORY_CARE_PROVIDER_SITE_OTHER): Payer: Medicare Other | Admitting: Internal Medicine

## 2020-10-27 ENCOUNTER — Encounter: Payer: Self-pay | Admitting: Internal Medicine

## 2020-10-27 VITALS — BP 160/100 | HR 74 | Ht 63.0 in | Wt 232.0 lb

## 2020-10-27 DIAGNOSIS — I10 Essential (primary) hypertension: Secondary | ICD-10-CM | POA: Diagnosis not present

## 2020-10-27 DIAGNOSIS — Z951 Presence of aortocoronary bypass graft: Secondary | ICD-10-CM | POA: Diagnosis not present

## 2020-10-27 DIAGNOSIS — E66813 Obesity, class 3: Secondary | ICD-10-CM | POA: Insufficient documentation

## 2020-10-27 DIAGNOSIS — Z6841 Body Mass Index (BMI) 40.0 and over, adult: Secondary | ICD-10-CM

## 2020-10-27 DIAGNOSIS — E1159 Type 2 diabetes mellitus with other circulatory complications: Secondary | ICD-10-CM | POA: Diagnosis not present

## 2020-10-27 DIAGNOSIS — I2510 Atherosclerotic heart disease of native coronary artery without angina pectoris: Secondary | ICD-10-CM

## 2020-10-27 DIAGNOSIS — Z794 Long term (current) use of insulin: Secondary | ICD-10-CM

## 2020-10-27 MED ORDER — HYDROXYZINE HCL 10 MG PO TABS: 10.0000 mg | ORAL_TABLET | Freq: Three times a day (TID) | ORAL | 3 refills | Status: DC | PRN

## 2020-10-27 MED ORDER — RANOLAZINE ER 1000 MG PO TB12: 1000.0000 mg | ORAL_TABLET | Freq: Two times a day (BID) | ORAL | 3 refills | Status: AC

## 2020-10-27 MED ORDER — AMLODIPINE BESYLATE 10 MG PO TABS: 10.0000 mg | ORAL_TABLET | Freq: Every day | ORAL | 3 refills | Status: AC

## 2020-10-27 MED ORDER — PANTOPRAZOLE SODIUM 40 MG PO TBEC: 40.0000 mg | DELAYED_RELEASE_TABLET | Freq: Two times a day (BID) | ORAL | 3 refills | Status: DC

## 2020-10-27 MED ORDER — CARVEDILOL 25 MG PO TABS: 25.0000 mg | ORAL_TABLET | Freq: Two times a day (BID) | ORAL | 3 refills | Status: DC

## 2020-10-27 MED ORDER — EZETIMIBE 10 MG PO TABS: 10.0000 mg | ORAL_TABLET | Freq: Every day | ORAL | 3 refills | Status: AC

## 2020-10-27 MED ORDER — SENNA 8.6 MG PO TABS: 1.0000 | ORAL_TABLET | Freq: Every day | ORAL | 3 refills | Status: DC

## 2020-10-27 MED ORDER — ATORVASTATIN CALCIUM 80 MG PO TABS: 80.0000 mg | ORAL_TABLET | Freq: Every day | ORAL | 3 refills | Status: AC

## 2020-10-27 NOTE — Addendum Note (Signed)
Addended by: Alois Cliche on: 10/27/2020 03:11 PM   Modules accepted: Orders

## 2020-10-27 NOTE — Assessment & Plan Note (Signed)
Patient is doing well after bypass surgery

## 2020-10-27 NOTE — Assessment & Plan Note (Signed)
Patient is doing well after bypass surgery she denies any chest pain or shortness of breath heart is regular chest decreased breath sounds on the left side, there is no friction rub no abdominal tenderness

## 2020-10-27 NOTE — Progress Notes (Signed)
New Patient Office Visit  Subjective:  Patient ID: Kayla Caldwell, female    DOB: 1975-04-19  Age: 45 y.o. MRN: [id number]  CC:  Chief Complaint  Patient presents with   New Patient (Initial Visit)    HPI Patient presents for new pt check up .  She has bypass surgery in [place].  Quadruple  by pass,  pt is diabetic  and obese.  Past Medical History:  Diagnosis Date   COPD (chronic obstructive pulmonary disease) ([place])    Diabetes ([place])    Heart disease    Heart failure (H[facility])    Hyperlipidemia    Hypertension      Current Outpatient Medications:    albuterol (VENTOLIN HFA) 108 (90 Base) MCG/ACT inhaler, Inhale 2 puffs into the lungs every 6 (six) hours as needed for wheezing or shortness of breath., Disp: , Rfl:    amLODipine (NORVASC) 10 MG tablet, Take 1 tablet (10 mg total) by mouth daily., Disp: 90 tablet, Rfl: 3   aspirin 81 MG chewable tablet, Chew 81 mg by mouth daily., Disp: , Rfl:    bumetanide (BUMEX) 2 MG tablet, Take 2 mg by mouth 2 (two) times daily., Disp: , Rfl:    carvedilol (COREG) 25 MG tablet, Take 25 mg by mouth 2 (two) times daily with a meal., Disp: , Rfl:    clopidogrel (PLAVIX) 75 MG tablet, Take 75 mg by mouth daily., Disp: , Rfl:    ezetimibe (ZETIA) 10 MG tablet, Take 10 mg by mouth daily., Disp: , Rfl:    famotidine (PEPCID) 20 MG tablet, Take 20 mg by mouth 2 (two) times daily., Disp: , Rfl:    hydrOXYzine (ATARAX/VISTARIL) 10 MG tablet, Take 10 mg by mouth every 8 (eight) hours as needed for anxiety, itching or sleep., Disp: , Rfl:    insulin glargine (LANTUS) 100 UNIT/ML injection, Inject 5 Units into the skin at bedtime., Disp: , Rfl:    insulin lispro (HUMALOG) 100 UNIT/ML injection, Inject 2 Units into the skin 3 (three) times daily before meals., Disp: , Rfl:    magnesium oxide (MAG-OX) 400 MG tablet, Take 400 mg by mouth daily., Disp: , Rfl:    pantoprazole (PROTONIX) 40 MG tablet, Take 40 mg by mouth 2 (two) times daily., Disp: , Rfl:     ranolazine (RANEXA) 1000 MG SR tablet, Take 1,000 mg by mouth 2 (two) times daily., Disp: , Rfl:    senna (SENOKOT) 8.6 MG TABS tablet, Take 1 tablet by mouth at bedtime., Disp: , Rfl:    atorvastatin (LIPITOR) 80 MG tablet, Take 1 tablet (80 mg total) by mouth daily., Disp: 90 tablet, Rfl: 3   Past Surgical History:  Procedure Laterality Date   ABDOMINAL HYSTERECTOMY     CORONARY ARTERY BYPASS GRAFT      Family History  Problem Relation Age of Onset   Hypertension Mother    Heart disease Mother    Depression Father    Hypertension Father    Heart disease Father    Cancer Maternal Aunt     Social History   Socioeconomic History   Marital status: Single    Spouse name: Not on file   Number of children: Not on file   Years of education: Not on file   Highest education level: Not on file  Occupational History   Not on file  Tobacco Use   Smoking status: Never   Smokeless tobacco: Never  Vaping Use   Vaping Use: Never used  Substance and Sexual Activity   Alcohol use: Never   Drug use: Never   Sexual activity: Not Currently  Other Topics Concern   Not on file  Social History Narrative   Not on file   Social Determinants of Health   Financial Resource Strain: Not on file  Food Insecurity: Not on file  Transportation Needs: Not on file  Physical Activity: Not on file  Stress: Not on file  Social Connections: Not on file  Intimate Partner Violence: Not on file    ROS Review of Systems  Constitutional: Negative.   HENT:  Negative for sneezing and sore throat.   Eyes:  Negative for photophobia and pain.  Respiratory:  Positive for shortness of breath and wheezing.   Gastrointestinal:  Negative for blood in stool and constipation.  Genitourinary: Negative.   Musculoskeletal:  Positive for back pain and neck pain.  Neurological:  Negative for tremors, seizures and headaches.  Psychiatric/Behavioral:  Negative for suicidal ideas. The patient is nervous/anxious.     Objective:   Today's Vitals: BP (!) 160/100   Pulse 74   Ht '5\' 3"'$  (1.6 m)   Wt 232 lb (105.2 kg)   BMI 41.10 kg/m   Physical Exam Constitutional:      Appearance: Normal appearance. She is obese.  HENT:     Head: Normocephalic.  Eyes:     Pupils: Pupils are equal, round, and reactive to light.  Cardiovascular:     Rate and Rhythm: Normal rate and regular rhythm.  Abdominal:     Tenderness: There is no abdominal tenderness.     Hernia: No hernia is present.  Musculoskeletal:     Cervical back: Normal range of motion.  Neurological:     General: No focal deficit present.     Mental Status: She is alert.     Cranial Nerves: No cranial nerve deficit.  Psychiatric:        Mood and Affect: Mood normal.  .  Assessment & Plan:   Problem List Items Addressed This Visit       Cardiovascular and Mediastinum   CAD (coronary artery disease), native coronary artery - Primary    Patient is doing well after bypass surgery she denies any chest pain or shortness of breath heart is regular chest decreased breath sounds on the left side, there is no friction rub no abdominal tenderness      Relevant Medications   amLODipine (NORVASC) 10 MG tablet   atorvastatin (LIPITOR) 80 MG tablet   Hypertension     Patient denies any chest pain or shortness of breath there is no history of palpitation or paroxysmal nocturnal dyspnea   patient was advised to follow low-salt low-cholesterol diet    ideally I want to keep systolic blood pressure below 130 mmHg, patient was asked to check blood pressure one times a week and give me a report on that.  Patient will be follow-up in 3 months  or earlier as needed, patient will call me back for any change in the cardiovascular symptoms Patient was advised to buy a book from local bookstore concerning blood pressure and read several chapters  every day.  This will be supplemented by some of the material we will give him from the office.  Patient should  also utilize other resources like YouTube and Internet to learn more about the blood pressure and the diet.      Relevant Medications   amLODipine (NORVASC) 10 MG tablet   atorvastatin (LIPITOR)  80 MG tablet     Endocrine   Diabetes ([place]) (Chronic)    - The patient's blood sugar is labile on med. - The patient will continue the current treatment regimen.  - I encouraged the patient to regularly check blood sugar.  - I encouraged the patient to monitor diet. I encouraged the patient to eat low-carb and low-sugar to help prevent blood sugar spikes.  - I encouraged the patient to continue following their prescribed treatment plan for diabetes - I informed the patient to get help if blood sugar drops below '54mg'$ /dL, or if suddenly have trouble thinking clearly or breathing.  Patient was advised to buy a book on diabetes from a local bookstore or from [place]  Patient should read 2 chapters every day to keep the motivation going, this is in addition to some of the materials we provided them from the office.  There are other resources on the Internet like YouTube and wilkipedia to get an education on the diabetes      Relevant Medications   atorvastatin (LIPITOR) 80 MG tablet     Other   Hx of CABG (Chronic)    Patient is doing well after bypass surgery      Class 3 severe obesity due to excess calories without serious comorbidity with body mass index (BMI) of 40.0 to 44.9 in adult [facility])    - I encouraged the patient to lose weight.  - I educated them on making healthy dietary choices including eating more fruits and vegetables and less fried foods. - I encouraged the patient to exercise more, and educated on the benefits of exercise including weight loss, diabetes prevention, and hypertension prevention.   Dietary counseling with a registered dietician  Referral to a weight management support group (e.g. Weight Watchers, Overeaters Anonymous)  If your BMI is greater than 29 or you have  gained more than 15 pounds you should work on weight loss.  Attend a healthy cooking class        Outpatient Encounter Medications as of 10/27/2020  Medication Sig   albuterol (VENTOLIN HFA) 108 (90 Base) MCG/ACT inhaler Inhale 2 puffs into the lungs every 6 (six) hours as needed for wheezing or shortness of breath.   amLODipine (NORVASC) 10 MG tablet Take 1 tablet (10 mg total) by mouth daily.   aspirin 81 MG chewable tablet Chew 81 mg by mouth daily.   bumetanide (BUMEX) 2 MG tablet Take 2 mg by mouth 2 (two) times daily.   carvedilol (COREG) 25 MG tablet Take 25 mg by mouth 2 (two) times daily with a meal.   clopidogrel (PLAVIX) 75 MG tablet Take 75 mg by mouth daily.   ezetimibe (ZETIA) 10 MG tablet Take 10 mg by mouth daily.   famotidine (PEPCID) 20 MG tablet Take 20 mg by mouth 2 (two) times daily.   hydrOXYzine (ATARAX/VISTARIL) 10 MG tablet Take 10 mg by mouth every 8 (eight) hours as needed for anxiety, itching or sleep.   insulin glargine (LANTUS) 100 UNIT/ML injection Inject 5 Units into the skin at bedtime.   insulin lispro (HUMALOG) 100 UNIT/ML injection Inject 2 Units into the skin 3 (three) times daily before meals.   magnesium oxide (MAG-OX) 400 MG tablet Take 400 mg by mouth daily.   pantoprazole (PROTONIX) 40 MG tablet Take 40 mg by mouth 2 (two) times daily.   ranolazine (RANEXA) 1000 MG SR tablet Take 1,000 mg by mouth 2 (two) times daily.   senna (SENOKOT) 8.6 MG  TABS tablet Take 1 tablet by mouth at bedtime.   [DISCONTINUED] atorvastatin (LIPITOR) 80 MG tablet Take 80 mg by mouth daily.   atorvastatin (LIPITOR) 80 MG tablet Take 1 tablet (80 mg total) by mouth daily.   [DISCONTINUED] potassium chloride (MICRO-K) 10 MEQ CR capsule Take 10 mEq by mouth daily.   No facility-administered encounter medications on file as of 10/27/2020.    Follow-up: No follow-ups on file.   Cletis Athens, MD

## 2020-10-27 NOTE — Assessment & Plan Note (Signed)

## 2020-10-27 NOTE — Assessment & Plan Note (Signed)

## 2020-10-27 NOTE — Assessment & Plan Note (Signed)

## 2020-10-28 MED ORDER — FREESTYLE LIBRE 14 DAY SENSOR MISC: 1.0000 | 6 refills | Status: DC

## 2020-10-28 NOTE — Addendum Note (Signed)
Addended by: Alois Cliche on: 10/28/2020 03:47 PM   Modules accepted: Orders

## 2020-10-29 ENCOUNTER — Telehealth: Payer: Self-pay

## 2020-10-29 NOTE — Telephone Encounter (Signed)
We have received records from [facility]. I attempted to contact patient via phone. Some one answered stating I was calling the wrong number. No other phone number on file/ no DPR on file.

## 2020-11-10 ENCOUNTER — Other Ambulatory Visit: Payer: Self-pay

## 2020-11-10 ENCOUNTER — Ambulatory Visit (INDEPENDENT_AMBULATORY_CARE_PROVIDER_SITE_OTHER): Payer: Medicare Other | Admitting: Internal Medicine

## 2020-11-10 ENCOUNTER — Encounter: Payer: Self-pay | Admitting: Internal Medicine

## 2020-11-10 VITALS — BP 155/85 | HR 67 | Ht 63.0 in | Wt 231.1 lb

## 2020-11-10 DIAGNOSIS — Z794 Long term (current) use of insulin: Secondary | ICD-10-CM

## 2020-11-10 DIAGNOSIS — Z6841 Body Mass Index (BMI) 40.0 and over, adult: Secondary | ICD-10-CM

## 2020-11-10 DIAGNOSIS — E876 Hypokalemia: Secondary | ICD-10-CM

## 2020-11-10 DIAGNOSIS — I10 Essential (primary) hypertension: Secondary | ICD-10-CM | POA: Diagnosis not present

## 2020-11-10 DIAGNOSIS — E1159 Type 2 diabetes mellitus with other circulatory complications: Secondary | ICD-10-CM

## 2020-11-10 DIAGNOSIS — I2510 Atherosclerotic heart disease of native coronary artery without angina pectoris: Secondary | ICD-10-CM | POA: Diagnosis not present

## 2020-11-10 DIAGNOSIS — Z951 Presence of aortocoronary bypass graft: Secondary | ICD-10-CM | POA: Diagnosis not present

## 2020-11-10 MED ORDER — POTASSIUM CHLORIDE CRYS ER 20 MEQ PO TBCR
20.0000 meq | EXTENDED_RELEASE_TABLET | Freq: Every day | ORAL | 3 refills | Status: DC
Start: 2020-11-10 — End: 2021-03-12

## 2020-11-10 NOTE — Progress Notes (Signed)
Established Patient Office Visit  Subjective:  Patient ID: Kayla Caldwell, female    DOB: 1975-09-15  Age: 45 y.o. MRN: [id number]  CC:  Chief Complaint  Patient presents with   Follow-up    Follow up appointment. Patient is requesting to talk with provider about a cardio referral.  Blood sugar is 107, tested at home this am.     Chest Pain  This is a recurrent problem. The current episode started 1 to 4 weeks ago. The onset quality is gradual. The problem has been waxing and waning. The pain is at a severity of 4/10. The quality of the pain is described as burning. The pain does not radiate. Associated symptoms include malaise/fatigue. Pertinent negatives include no abdominal pain, back pain, diaphoresis, dizziness, leg pain, nausea, near-syncope, orthopnea, palpitations, PND, shortness of breath, sputum production, syncope, vomiting or weakness.  Her past medical history is significant for CAD, MI and seizures.  Pertinent negatives for past medical history include no aneurysm.   Kayla Caldwell presents forchest pain  Past Medical History:  Diagnosis Date   COPD (chronic obstructive pulmonary disease) ([place])    Diabetes ([place])    Heart disease    Heart failure ([place])    Hyperlipidemia    Hypertension     Past Surgical History:  Procedure Laterality Date   ABDOMINAL HYSTERECTOMY     CORONARY ARTERY BYPASS GRAFT      Family History  Problem Relation Age of Onset   Hypertension Mother    Heart disease Mother    Depression Father    Hypertension Father    Heart disease Father    Cancer Maternal Aunt     Social History   Socioeconomic History   Marital status: Single    Spouse name: Not on file   Number of children: Not on file   Years of education: Not on file   Highest education level: Not on file  Occupational History   Not on file  Tobacco Use   Smoking status: Never   Smokeless tobacco: Never  Vaping Use   Vaping Use: Never used  Substance and Sexual  Activity   Alcohol use: Never   Drug use: Never   Sexual activity: Not Currently  Other Topics Concern   Not on file  Social History Narrative   Not on file   Social Determinants of Health   Financial Resource Strain: Not on file  Food Insecurity: Not on file  Transportation Needs: Not on file  Physical Activity: Not on file  Stress: Not on file  Social Connections: Not on file  Intimate Partner Violence: Not on file     Current Outpatient Medications:    albuterol (VENTOLIN HFA) 108 (90 Base) MCG/ACT inhaler, Inhale 2 puffs into the lungs every 6 (six) hours as needed for wheezing or shortness of breath., Disp: , Rfl:    amLODipine (NORVASC) 10 MG tablet, Take 1 tablet (10 mg total) by mouth daily., Disp: 90 tablet, Rfl: 3   aspirin 81 MG chewable tablet, Chew 81 mg by mouth daily., Disp: , Rfl:    atorvastatin (LIPITOR) 80 MG tablet, Take 1 tablet (80 mg total) by mouth daily., Disp: 90 tablet, Rfl: 3   bumetanide (BUMEX) 2 MG tablet, Take 2 mg by mouth 2 (two) times daily., Disp: , Rfl:    carvedilol (COREG) 25 MG tablet, Take 1 tablet (25 mg total) by mouth 2 (two) times daily with a meal., Disp: 180 tablet, Rfl: 3   clopidogrel (  PLAVIX) 75 MG tablet, Take 75 mg by mouth daily., Disp: , Rfl:    Continuous Blood Gluc Sensor (FREESTYLE LIBRE 14 DAY SENSOR) MISC, 1 each by Does not apply route every 14 (fourteen) days., Disp: 1 each, Rfl: 6   ezetimibe (ZETIA) 10 MG tablet, Take 1 tablet (10 mg total) by mouth daily., Disp: 90 tablet, Rfl: 3   hydrOXYzine (ATARAX/VISTARIL) 10 MG tablet, Take 1 tablet (10 mg total) by mouth every 8 (eight) hours as needed., Disp: 90 tablet, Rfl: 3   insulin glargine (LANTUS) 100 UNIT/ML injection, Inject 5 Units into the skin at bedtime., Disp: , Rfl:    insulin lispro (HUMALOG) 100 UNIT/ML injection, Inject 2 Units into the skin 3 (three) times daily before meals., Disp: , Rfl:    magnesium oxide (MAG-OX) 400 MG tablet, Take 400 mg by mouth daily.,  Disp: , Rfl:    pantoprazole (PROTONIX) 40 MG tablet, Take 1 tablet (40 mg total) by mouth 2 (two) times daily., Disp: 180 tablet, Rfl: 3   ranolazine (RANEXA) 1000 MG SR tablet, Take 1 tablet (1,000 mg total) by mouth 2 (two) times daily., Disp: 180 tablet, Rfl: 3   senna (SENOKOT) 8.6 MG TABS tablet, Take 1 tablet (8.6 mg total) by mouth at bedtime., Disp: 90 tablet, Rfl: 3   Allergies  Allergen Reactions   Compazine [Prochlorperazine]    Lisinopril    Naproxen    Nitroglycerin    Toradol [Ketorolac Tromethamine]    Tramadol     ROS Review of Systems  Constitutional:  Positive for malaise/fatigue. Negative for diaphoresis.  HENT: Negative.    Eyes: Negative.   Respiratory: Negative.  Negative for sputum production and shortness of breath.   Cardiovascular:  Positive for chest pain. Negative for palpitations, orthopnea, syncope, PND and near-syncope.  Gastrointestinal: Negative.  Negative for abdominal pain, nausea and vomiting.  Endocrine: Negative.   Genitourinary: Negative.   Musculoskeletal: Negative.  Negative for back pain.  Skin: Negative.   Allergic/Immunologic: Negative.   Neurological:  Positive for seizures. Negative for dizziness and weakness.  Hematological: Negative.   Psychiatric/Behavioral: Negative.    All other systems reviewed and are negative.    Objective:    Physical Exam Vitals reviewed.  Constitutional:      Appearance: Normal appearance.  HENT:     Mouth/Throat:     Mouth: Mucous membranes are moist.  Eyes:     Pupils: Pupils are equal, round, and reactive to light.  Neck:     Vascular: No carotid bruit.  Cardiovascular:     Rate and Rhythm: Normal rate and regular rhythm.     Pulses: Normal pulses.     Heart sounds: Normal heart sounds.     Comments: Patient with history of bypass surgery in [place].  She has four-vessel bypass.  Before that she has a history of 3 heart attacks.  Patient is diabetic her blood sugar is coming down.  She is  tender in the both side of the chest and also has a keloid formation. Pulmonary:     Effort: Pulmonary effort is normal.     Breath sounds: Normal breath sounds.  Abdominal:     General: Bowel sounds are normal.     Palpations: Abdomen is soft. There is no hepatomegaly, splenomegaly or mass.     Tenderness: There is no abdominal tenderness.     Hernia: No hernia is present.  Musculoskeletal:        General: No tenderness.  Cervical back: Neck supple.     Right lower leg: No edema.     Left lower leg: No edema.  Skin:    Findings: No rash.  Neurological:     Mental Status: She is alert and oriented to person, place, and time.     Motor: No weakness.  Psychiatric:        Mood and Affect: Mood and affect normal.        Behavior: Behavior normal.    BP (!) 155/85   Pulse 67   Ht '5\' 3"'$  (1.6 m)   Wt 231 lb 1.6 oz (104.8 kg)   BMI 40.94 kg/m  Wt Readings from Last 3 Encounters:  11/10/20 231 lb 1.6 oz (104.8 kg)  10/27/20 232 lb (105.2 kg)  10/11/20 249 lb 1.9 oz (113 kg)     Health Maintenance Due  Topic Date Due   COVID-19 Vaccine (1) Never done   FOOT EXAM  Never done   URINE MICROALBUMIN  Never done   Hepatitis C Screening  Never done   PAP SMEAR-Modifier  Never done   COLONOSCOPY (Pts 45-79yr Insurance coverage will need to be confirmed)  Never done   INFLUENZA VACCINE  09/28/2020    There are no preventive care reminders to display for this patient.  No results found for: TSH Lab Results  Component Value Date   WBC 8.8 10/11/2020   HGB 10.0 (L) 10/11/2020   HCT 29.1 (L) 10/11/2020   MCV 87.7 10/11/2020   PLT 241 10/11/2020   Lab Results  Component Value Date   NA 137 10/11/2020   K 3.2 (L) 10/11/2020   CO2 27 10/11/2020   GLUCOSE 183 (H) 10/11/2020   BUN 12 10/11/2020   CREATININE 0.84 10/11/2020   BILITOT 1.3 (H) 10/11/2020   ALKPHOS 60 10/11/2020   AST 13 (L) 10/11/2020   ALT 10 10/11/2020   PROT 7.6 10/11/2020   ALBUMIN 3.7 10/11/2020    CALCIUM 9.3 10/11/2020   ANIONGAP 9 10/11/2020   No results found for: CHOL No results found for: HDL No results found for: LDLCALC No results found for: TRIG No results found for: CHOLHDL Lab Results  Component Value Date   HGBA1C 6.9 (H) 10/11/2020      Assessment & Plan:   Problem List Items Addressed This Visit       Cardiovascular and Mediastinum   CAD (coronary artery disease), native coronary artery    Patient has a history of bypass surgery.  She is having atypical chest pain so we will do a stress test and echocardiogram.      Hypertension     Patient denies any chest pain or shortness of breath there is no history of palpitation or paroxysmal nocturnal dyspnea   patient was advised to follow low-salt low-cholesterol diet    ideally I want to keep systolic blood pressure below 130 mmHg, patient was asked to check blood pressure one times a week and give me a report on that.  Patient will be follow-up in 3 months  or earlier as needed, patient will call me back for any change in the cardiovascular symptoms Patient was advised to buy a book from local bookstore concerning blood pressure and read several chapters  every day.  This will be supplemented by some of the material we will give him from the office.  Patient should also utilize other resources like YouTube and Internet to learn more about the blood pressure and the diet.  Endocrine   Diabetes ([place]) (Chronic)    - The patient's blood sugar is labile on med. - The patient will continue the current treatment regimen.  - I encouraged the patient to regularly check blood sugar.  - I encouraged the patient to monitor diet. I encouraged the patient to eat low-carb and low-sugar to help prevent blood sugar spikes.  - I encouraged the patient to continue following their prescribed treatment plan for diabetes - I informed the patient to get help if blood sugar drops below '54mg'$ /dL, or if suddenly have trouble  thinking clearly or breathing.  Patient was advised to buy a book on diabetes from a local bookstore or from [place]  Patient should read 2 chapters every day to keep the motivation going, this is in addition to some of the materials we provided them from the office.  There are other resources on the Internet like YouTube and wilkipedia to get an education on the diabetes        Other   Hx of CABG (Chronic)    Schedule echo and stress test      Class 3 severe obesity due to excess calories without serious comorbidity with body mass index (BMI) of 40.0 to 44.9 in adult [facility])    - I encouraged the patient to lose weight.  - I educated them on making healthy dietary choices including eating more fruits and vegetables and less fried foods. - I encouraged the patient to exercise more, and educated on the benefits of exercise including weight loss, diabetes prevention, and hypertension prevention.   Dietary counseling with a registered dietician  Referral to a weight management support group (e.g. Weight Watchers, Overeaters Anonymous)  If your BMI is greater than 29 or you have gained more than 15 pounds you should work on weight loss.  Attend a healthy cooking class       Other Visit Diagnoses     Hypokalemia    -  Primary       No orders of the defined types were placed in this encounter.   Follow-up: No follow-ups on file.    Cletis Athens, MD

## 2020-11-10 NOTE — Assessment & Plan Note (Signed)

## 2020-11-10 NOTE — Assessment & Plan Note (Signed)

## 2020-11-10 NOTE — Assessment & Plan Note (Signed)

## 2020-11-10 NOTE — Assessment & Plan Note (Signed)
Patient has a history of bypass surgery.  She is having atypical chest pain so we will do a stress test and echocardiogram.

## 2020-11-10 NOTE — Assessment & Plan Note (Signed)
Schedule echo and stress test

## 2020-11-17 ENCOUNTER — Emergency Department
Admission: EM | Admit: 2020-11-17 | Discharge: 2020-11-18 | Disposition: A | Discharge status: Home / Self Care | Payer: Medicare Other | Attending: Emergency Medicine | Admitting: Emergency Medicine

## 2020-11-17 ENCOUNTER — Emergency Department: Payer: Medicare Other

## 2020-11-17 ENCOUNTER — Encounter: Payer: Self-pay | Admitting: Emergency Medicine

## 2020-11-17 DIAGNOSIS — I2510 Atherosclerotic heart disease of native coronary artery without angina pectoris: Secondary | ICD-10-CM | POA: Diagnosis not present

## 2020-11-17 DIAGNOSIS — R609 Edema, unspecified: Secondary | ICD-10-CM

## 2020-11-17 DIAGNOSIS — Z7951 Long term (current) use of inhaled steroids: Secondary | ICD-10-CM | POA: Diagnosis not present

## 2020-11-17 DIAGNOSIS — M79601 Pain in right arm: Secondary | ICD-10-CM | POA: Diagnosis not present

## 2020-11-17 DIAGNOSIS — J449 Chronic obstructive pulmonary disease, unspecified: Secondary | ICD-10-CM | POA: Diagnosis not present

## 2020-11-17 DIAGNOSIS — Z7982 Long term (current) use of aspirin: Secondary | ICD-10-CM | POA: Insufficient documentation

## 2020-11-17 DIAGNOSIS — Z7902 Long term (current) use of antithrombotics/antiplatelets: Secondary | ICD-10-CM | POA: Diagnosis not present

## 2020-11-17 DIAGNOSIS — Z79899 Other long term (current) drug therapy: Secondary | ICD-10-CM | POA: Diagnosis not present

## 2020-11-17 DIAGNOSIS — R2231 Localized swelling, mass and lump, right upper limb: Secondary | ICD-10-CM

## 2020-11-17 DIAGNOSIS — Z951 Presence of aortocoronary bypass graft: Secondary | ICD-10-CM | POA: Diagnosis not present

## 2020-11-17 DIAGNOSIS — J9811 Atelectasis: Secondary | ICD-10-CM | POA: Diagnosis not present

## 2020-11-17 DIAGNOSIS — I119 Hypertensive heart disease without heart failure: Secondary | ICD-10-CM | POA: Insufficient documentation

## 2020-11-17 DIAGNOSIS — R079 Chest pain, unspecified: Secondary | ICD-10-CM

## 2020-11-17 DIAGNOSIS — R0789 Other chest pain: Secondary | ICD-10-CM

## 2020-11-17 DIAGNOSIS — E119 Type 2 diabetes mellitus without complications: Secondary | ICD-10-CM | POA: Diagnosis not present

## 2020-11-17 DIAGNOSIS — M7989 Other specified soft tissue disorders: Secondary | ICD-10-CM | POA: Diagnosis not present

## 2020-11-17 DIAGNOSIS — J90 Pleural effusion, not elsewhere classified: Secondary | ICD-10-CM | POA: Diagnosis not present

## 2020-11-17 LAB — TROPONIN I (HIGH SENSITIVITY)
Troponin I (High Sensitivity): 17 ng/L (ref <18)
Troponin I (High Sensitivity): 18 ng/L — ABNORMAL HIGH (ref <18)

## 2020-11-17 LAB — CBC
HCT: 35.4 % — ABNORMAL LOW (ref 36.0–46.0)
Hemoglobin: 12.7 g/dL (ref 12.0–15.0)
MCH: 29.9 pg (ref 26.0–34.0)
MCHC: 35.9 g/dL (ref 30.0–36.0)
MCV: 83.3 fL (ref 80.0–100.0)
Platelets: 252 10*3/uL (ref 150–400)
RBC: 4.25 MIL/uL (ref 3.87–5.11)
RDW: 14.8 % (ref 11.5–15.5)
WBC: 7 10*3/uL (ref 4.0–10.5)
nRBC: 0 % (ref 0.0–0.2)

## 2020-11-17 LAB — BASIC METABOLIC PANEL
Anion gap: 9 (ref 5–15)
BUN: 15 mg/dL (ref 6–20)
CO2: 25 mmol/L (ref 22–32)
Calcium: 9.6 mg/dL (ref 8.9–10.3)
Chloride: 103 mmol/L (ref 98–111)
Creatinine, Ser: 0.84 mg/dL (ref 0.44–1.00)
GFR, Estimated: >60 mL/min (ref >60)
Glucose, Bld: 179 mg/dL — ABNORMAL HIGH (ref 70–99)
Potassium: 3.8 mmol/L (ref 3.5–5.1)
Sodium: 137 mmol/L (ref 135–145)

## 2020-11-17 MED ORDER — LABETALOL HCL 5 MG/ML IV SOLN
5.0000 mg | Freq: Once | INTRAVENOUS | Status: AC
  Administered 2020-11-17: 5 mg via INTRAVENOUS
  Filled 2020-11-17: qty 4

## 2020-11-17 MED ORDER — OXYCODONE HCL 5 MG PO TABS
5.0000 mg | ORAL_TABLET | Freq: Once | ORAL | Status: AC
  Administered 2020-11-17: 5 mg via ORAL
  Filled 2020-11-17: qty 1

## 2020-11-17 MED ORDER — LIDOCAINE VISCOUS HCL 2 % MT SOLN
15.0000 mL | Freq: Once | OROMUCOSAL | Status: AC
  Administered 2020-11-17: 15 mL via ORAL
  Filled 2020-11-17: qty 15

## 2020-11-17 MED ORDER — MORPHINE SULFATE (PF) 4 MG/ML IV SOLN
4.0000 mg | Freq: Once | INTRAVENOUS | Status: AC
  Administered 2020-11-17: 4 mg via INTRAVENOUS
  Filled 2020-11-17: qty 1

## 2020-11-17 MED ORDER — IOHEXOL 350 MG/ML SOLN
100.0000 mL | Freq: Once | INTRAVENOUS | Status: AC | PRN
  Administered 2020-11-17: 100 mL via INTRAVENOUS

## 2020-11-17 MED ORDER — ALUM & MAG HYDROXIDE-SIMETH 200-200-20 MG/5ML PO SUSP
30.0000 mL | Freq: Once | ORAL | Status: AC
  Administered 2020-11-17: 30 mL via ORAL
  Filled 2020-11-17: qty 30

## 2020-11-17 NOTE — ED Notes (Signed)
Care transferred, report received from Jordan, RN 

## 2020-11-17 NOTE — ED Provider Notes (Signed)
[facility] Emergency Department Provider Note  ____________________________________________   Event Date/Time   First MD Initiated Contact with Patient 11/17/20 2028     (approximate)  I have reviewed the triage vital signs and the nursing notes.   HISTORY  Chief Complaint Chest Pain    HPI Kayla Caldwell is a 45 y.o. female coronary artery disease status post CABG x4 on August 18, 2020 at [place], history of postop seroma that was drained, COPD, heart failure, hypertension, diabetes melitis on insulin, GERD, hyperlipidemia,  DAPT with Plavix, who presented to the emergency department today for chest pain and burning for past 3 days.  Patient reports a sensation on the right side of her chest that is like a burning sensation that is right underneath the skin but then occasionally she will get sharp stabbing sensations as well.  This is intermittent, nothing makes it better or worse.  States that she has been taking Tylenol at home with some mild relief in symptoms.  She states that she is starting develop a keloid over her scar and so she is not sure if it could just be related to that but she states it does not feel like her prior heart attacks.  She denies the pain radiating to her back and denies any shortness of breath associated with it.    She also reports that some swelling in her right arm has been there for about a month.  She states that it started right after she was discharged from the hospital when she was here and has been gradually getting worse and she reports some pain with movement and with pushing on the arm.  Denies having this previously.  States that she did not have any PICC line in this arm. Had a cath in it but that was months ago.  It has been gradually getting larger in size.    On review of records patient in October 11, 2020 also came in with some upper abdominal and chest pain and was admitted.  At that time  there was concern for potential diaphragm paralysis post CABG versus atelectasis.    Past Medical History:  Diagnosis Date   COPD (chronic obstructive pulmonary disease) ([place])    Diabetes ([place])    Heart disease    Heart failure ([place])    Hyperlipidemia    Hypertension     Patient Active Problem List   Diagnosis Date Noted   Class 3 severe obesity due to excess calories without serious comorbidity with body mass index (BMI) of 40.0 to 44.9 in adult [facility]) 10/27/2020   Atelectasis, left    Lobar pneumonia ([place]) 10/11/2020   CAD (coronary artery disease), native coronary artery 10/11/2020   Hx of CABG 10/11/2020   Pneumonia 10/11/2020   Diabetes ([place])    Hypertension     Past Surgical History:  Procedure Laterality Date   ABDOMINAL HYSTERECTOMY     CORONARY ARTERY BYPASS GRAFT      Prior to Admission medications   Medication Sig Start Date End Date Taking? Authorizing Provider  albuterol (VENTOLIN HFA) 108 (90 Base) MCG/ACT inhaler Inhale 2 puffs into the lungs every 6 (six) hours as needed for wheezing or shortness of breath.    [provider]  amLODipine (NORVASC) 10 MG tablet Take 1 tablet (10 mg total) by mouth daily. 10/27/20   Cletis Athens, MD  aspirin 81 MG chewable tablet Chew 81 mg by mouth daily.    [provider]  atorvastatin (LIPITOR) 80 MG tablet Take 1 tablet (80 mg total) by mouth daily. 10/27/20   Cletis Athens, MD  bumetanide (BUMEX) 2 MG tablet Take 2 mg by mouth 2 (two) times daily.    [provider]  carvedilol (COREG) 25 MG tablet Take 1 tablet (25 mg total) by mouth 2 (two) times daily with a meal. 10/27/20   Cletis Athens, MD  clopidogrel (PLAVIX) 75 MG tablet Take 75 mg by mouth daily.    [provider]  Continuous Blood Gluc Sensor (FREESTYLE LIBRE 14 DAY SENSOR) MISC 1 each by Does not apply route every 14 (fourteen) days. 10/28/20   Cletis Athens, MD  ezetimibe (ZETIA) 10 MG tablet Take 1 tablet (10 mg total) by  mouth daily. 10/27/20   Cletis Athens, MD  hydrOXYzine (ATARAX/VISTARIL) 10 MG tablet Take 1 tablet (10 mg total) by mouth every 8 (eight) hours as needed. 10/27/20   Cletis Athens, MD  insulin glargine (LANTUS) 100 UNIT/ML injection Inject 5 Units into the skin at bedtime.    [provider]  insulin lispro (HUMALOG) 100 UNIT/ML injection Inject 2 Units into the skin 3 (three) times daily before meals.    [provider]  magnesium oxide (MAG-OX) 400 MG tablet Take 400 mg by mouth daily.    [provider]  pantoprazole (PROTONIX) 40 MG tablet Take 1 tablet (40 mg total) by mouth 2 (two) times daily. 10/27/20   Cletis Athens, MD  potassium chloride SA (KLOR-CON) 20 MEQ tablet Take 1 tablet (20 mEq total) by mouth daily. 11/10/20   Cletis Athens, MD  ranolazine (RANEXA) 1000 MG SR tablet Take 1 tablet (1,000 mg total) by mouth 2 (two) times daily. 10/27/20   Cletis Athens, MD  senna (SENOKOT) 8.6 MG TABS tablet Take 1 tablet (8.6 mg total) by mouth at bedtime. 10/27/20   Cletis Athens, MD    Allergies Compazine [prochlorperazine], Lisinopril, Naproxen, Nitroglycerin, Toradol [ketorolac tromethamine], and Tramadol  Family History  Problem Relation Age of Onset   Hypertension Mother    Heart disease Mother    Depression Father    Hypertension Father    Heart disease Father    Cancer Maternal Aunt     Social History Social History   Tobacco Use   Smoking status: Never   Smokeless tobacco: Never  Vaping Use   Vaping Use: Never used  Substance Use Topics   Alcohol use: Never   Drug use: Never      Review of Systems Constitutional: No fever/chills Eyes: No visual changes. ENT: No sore throat. Cardiovascular: Positive chest pain Respiratory: Denies shortness of breath. Gastrointestinal: No abdominal pain.  No nausea, no vomiting.  No diarrhea.  No constipation. Genitourinary: Negative for dysuria. Musculoskeletal: Negative for back pain.  Right arm  swelling  Skin: Negative for rash. Neurological: Negative for headaches, focal weakness or numbness. All other ROS negative ____________________________________________   PHYSICAL EXAM:  VITAL SIGNS: ED Triage Vitals [11/17/20 1913]  Enc Vitals Group     BP (!) 190/92     Pulse Rate 79     Resp 17     Temp 98.6 F (37 C)     Temp Source Oral     SpO2 97 %     Weight      Height      Head Circumference      Peak Flow      Pain Score      Pain Loc  Pain Edu?      Excl. in [place]?     Constitutional: Alert and oriented. Well appearing and in no acute distress. Eyes: Conjunctivae are normal. EOMI. Head: Atraumatic. Nose: No congestion/rhinnorhea. Mouth/Throat: Mucous membranes are moist.   Neck: No stridor. Trachea Midline. FROM Cardiovascular: Normal rate, regular rhythm. Grossly normal heart sounds.  Good peripheral circulation.  Surgical scar noted on the chest wall. Respiratory: Normal respiratory effort.  No retractions. Lungs CTAB. Gastrointestinal: Soft and nontender. No distention. No abdominal bruits.  Musculoskeletal: Swelling noted in the right upper arm.  No redness or warmth noted no joint effusions. Normal cap refill. Doppler radial pulse.  Neurologic:  Normal speech and language. No gross focal neurologic deficits are appreciated.  Skin:  Skin is warm, dry and intact. No rash noted. Psychiatric: Mood and affect are normal. Speech and behavior are normal. GU: Deferred   ____________________________________________   LABS (all labs ordered are listed, but only abnormal results are displayed)  Labs Reviewed  BASIC METABOLIC PANEL - Abnormal; Notable for the following components:      Result Value   Glucose, Bld 179 (*)    All other components within normal limits  CBC - Abnormal; Notable for the following components:   HCT 35.4 (*)    All other components within normal limits  TROPONIN I (HIGH SENSITIVITY)    ____________________________________________   ED ECG REPORT I, Vanessa Tallulah, the attending physician, personally viewed and interpreted this ECG.  Normal sinus rate of 76 with no ST elevations, T wave inversion in aVL, normal intervals.  Looks similar to prior EKG   ____________________________________________  RADIOLOGY Robert Bellow, personally viewed and evaluated these images (plain radiographs) as part of my medical decision making, as well as reviewing the written report by the radiologist.  ED MD interpretation: Small left pleural effusion  Official radiology report(s): DG Chest 2 View  Result Date: 11/17/2020 CLINICAL DATA:  Right-sided chest pain EXAM: CHEST - 2 VIEW COMPARISON:  10/11/2020 FINDINGS: Status post median sternotomy. Cardiac and mediastinal contours are within normal limits. Redemonstrated opacities in the left lung base, with blunting of the left costophrenic angle, likely a small left pleural effusion with associated atelectasis. No new focal pulmonary opacity. No acute osseous abnormality. IMPRESSION: Small left pleural effusion with associated atelectasis. Electronically Signed   By: Merilyn Baba M.D.   On: 11/17/2020 19:57    ____________________________________________   PROCEDURES  Procedure(s) performed (including Critical Care):  .1-3 Lead EKG Interpretation Performed by: Vanessa Dolores, MD Authorized by: Vanessa Zwolle, MD     Interpretation: normal     ECG rate:  70s   ECG rate assessment: normal     Rhythm: sinus rhythm     Ectopy: none     Conduction: normal     ____________________________________________   INITIAL IMPRESSION / ASSESSMENT AND PLAN / ED COURSE   Channell Quattrone was evaluated in Emergency Department on 11/17/2020 for the symptoms described in the history of present illness. She was evaluated in the context of the global COVID-19 pandemic, which necessitated consideration that the patient might be at risk for  infection with the SARS-CoV-2 virus that causes COVID-19. Institutional protocols and algorithms that pertain to the evaluation of patients at risk for COVID-19 are in a state of rapid change based on information released by regulatory bodies including the CDC and federal and state organizations. These policies and algorithms were followed during the patient's care in the ED.  Most Likely DDx:  -MSK (atypical chest pain) but will get cardiac markers to evaluate for ACS given risk factors/age.  For the right arm mass ultrasound was ordered to evaluate for DVT.  Somewhat difficult to feel the right pulse but I am able to Doppler it therefore will get CT scan to make sure evidence of dissection and ultrasound later recommended a CT of the arm given the concern for potential hematoma so I added this on as well.  Discussed with radiology who did not recommend a CTA of the arm but just a CT with contrast which I think is reasonable given patient has a distal pulse.    DDx that was also considered d/t potential to cause harm, but was found less likely based on history and physical (as detailed above): -PNA (no fevers, cough but CXR to evaluate) -PNX (reassured with equal b/l breath sounds, CXR to evaluate) -Symptomatic anemia (will get H&H) -Pulmonary embolism as no sob at rest, not pleuritic in nature, no hypoxia -Pericarditis no rub on exam, EKG changes or hx to suggest dx -Tamponade (no notable SOB, tachycardic, hypotensive) -Esophageal rupture (no h/o diffuse vomitting/no crepitus)  Troponin markers are stable and this does not sound like a cardiac chest pain.  However patient is prehypertensive we will give him a dose of labetalol.  Patient handed off to oncoming team pending CT scans and reassessment of pain for final disposition       ____________________________________________   FINAL CLINICAL IMPRESSION(S) / ED DIAGNOSES   Final diagnoses:  Swelling     MEDICATIONS GIVEN DURING  THIS VISIT:  Medications - No data to display   ED Discharge Orders     None        Note:  This document was prepared using Dragon voice recognition software and may include unintentional dictation errors.    Vanessa Tabiona, MD 11/17/20 2322

## 2020-11-17 NOTE — ED Triage Notes (Addendum)
Pt c/o right sided intermittent chest burning and pain x3 days with mass to the right upper arm. No heat felt at mass sight. Swelling noted with limited mobility to raise arm without causing pain to the shoulder and upper arm. 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 Pt denies SOB. Pt takes Plavix.

## 2020-11-17 NOTE — ED Notes (Signed)
IV team called stating that it will be approx 1 hour  before she will be able to come to the ED to start pt's IV. Will notify MD

## 2020-11-17 NOTE — ED Notes (Signed)
Pt to CT via stretcher

## 2020-11-17 NOTE — ED Provider Notes (Signed)
-----------------------------------------   11:17 PM on 11/17/2020 -----------------------------------------  Assuming care from Dr. Jari Pigg.  In short, Kayla Caldwell is a 45 y.o. female with a chief complaint of chest pain and right arm mass.  Refer to the original H&P for additional details.  The current plan of care is to follow up on CTA chest and CT humerus and reassess.  Troponins are stable.  Getting some labetalol for BP.  If no evidence of emergent condition, may consider discharge.     (Note that documentation was delayed due to multiple ED patients requiring immediate care.)  The patient's CTA chest showed an area of atelectasis versus pneumonia but no emergent finding.  I verified with the patient she has had no symptoms suggestive of pneumonia and I do not believe this represents an acute infection.  Her CT humerus was unremarkable and the area identified on ultrasound was not easily identified on CT.  I talked to the patient about the results and encouraged her to follow-up with her primary care doctor about a possible outpatient MRI if they agree that is appropriate.  She understands.  Patient had an episode of vomiting I provided Zofran.  I had a discussion with her about establishing local cardiology care and she agrees with that plan and would like follow-up information.  At the time of discharge, she was stable, tolerating oral intake without difficulty, and appropriate for discharge with no evidence of an emergent medical condition.   Hinda Kehr, MD 11/18/20 [phone]

## 2020-11-18 DIAGNOSIS — R0789 Other chest pain: Secondary | ICD-10-CM | POA: Diagnosis not present

## 2020-11-18 DIAGNOSIS — R079 Chest pain, unspecified: Secondary | ICD-10-CM | POA: Diagnosis not present

## 2020-11-18 DIAGNOSIS — M7989 Other specified soft tissue disorders: Secondary | ICD-10-CM | POA: Diagnosis not present

## 2020-11-18 MED ORDER — ONDANSETRON HCL 4 MG/2ML IJ SOLN
4.0000 mg | INTRAMUSCULAR | Status: AC
  Administered 2020-11-18: 4 mg via INTRAVENOUS
  Filled 2020-11-18: qty 2

## 2020-11-18 MED ORDER — ONDANSETRON 4 MG PO TBDP: ORAL_TABLET | ORAL | 0 refills | Status: DC

## 2020-11-18 NOTE — Discharge Instructions (Addendum)
Your evaluation was generally reassuring in the emergency department.  Please follow-up with Dr. Lavera Guise to discuss whether or not you need additional imaging such as an outpatient MRI for the mass on your arm.  Please consider calling Dr. Clayborn Bigness with cardiology to establish local care.  Continue to take your regular medications.  Return to the emergency department if you develop new or worsening symptoms that concern you.

## 2020-11-18 NOTE — ED Notes (Signed)
Pt is actively vomiting, MD notified and aware. Waiting on orders

## 2020-11-24 DIAGNOSIS — I2510 Atherosclerotic heart disease of native coronary artery without angina pectoris: Secondary | ICD-10-CM | POA: Diagnosis not present

## 2020-11-24 DIAGNOSIS — Z951 Presence of aortocoronary bypass graft: Secondary | ICD-10-CM | POA: Diagnosis not present

## 2020-11-24 DIAGNOSIS — I10 Essential (primary) hypertension: Secondary | ICD-10-CM | POA: Diagnosis not present

## 2020-11-24 DIAGNOSIS — E7800 Pure hypercholesterolemia, unspecified: Secondary | ICD-10-CM | POA: Diagnosis not present

## 2020-11-30 ENCOUNTER — Other Ambulatory Visit: Payer: Self-pay | Admitting: *Deleted

## 2020-11-30 DIAGNOSIS — E119 Type 2 diabetes mellitus without complications: Secondary | ICD-10-CM

## 2020-11-30 MED ORDER — FREESTYLE LIBRE 14 DAY SENSOR MISC
1.0000 | 6 refills | Status: AC
Start: 2020-11-30 — End: ?

## 2020-12-09 ENCOUNTER — Other Ambulatory Visit: Payer: Self-pay | Admitting: *Deleted

## 2020-12-09 ENCOUNTER — Other Ambulatory Visit: Payer: Medicaid - Out of State

## 2020-12-15 ENCOUNTER — Other Ambulatory Visit: Payer: Self-pay | Admitting: Family Medicine

## 2020-12-15 DIAGNOSIS — E7800 Pure hypercholesterolemia, unspecified: Secondary | ICD-10-CM | POA: Diagnosis not present

## 2020-12-15 DIAGNOSIS — Z7689 Persons encountering health services in other specified circumstances: Secondary | ICD-10-CM | POA: Diagnosis not present

## 2020-12-15 DIAGNOSIS — Z1231 Encounter for screening mammogram for malignant neoplasm of breast: Secondary | ICD-10-CM

## 2020-12-15 DIAGNOSIS — Z794 Long term (current) use of insulin: Secondary | ICD-10-CM | POA: Diagnosis not present

## 2020-12-15 DIAGNOSIS — E119 Type 2 diabetes mellitus without complications: Secondary | ICD-10-CM | POA: Diagnosis not present

## 2020-12-15 DIAGNOSIS — I2510 Atherosclerotic heart disease of native coronary artery without angina pectoris: Secondary | ICD-10-CM | POA: Diagnosis not present

## 2020-12-15 DIAGNOSIS — G4730 Sleep apnea, unspecified: Secondary | ICD-10-CM | POA: Diagnosis not present

## 2020-12-15 DIAGNOSIS — M65331 Trigger finger, right middle finger: Secondary | ICD-10-CM | POA: Diagnosis not present

## 2020-12-15 DIAGNOSIS — D649 Anemia, unspecified: Secondary | ICD-10-CM | POA: Diagnosis not present

## 2020-12-15 DIAGNOSIS — Z0000 Encounter for general adult medical examination without abnormal findings: Secondary | ICD-10-CM | POA: Diagnosis not present

## 2020-12-15 DIAGNOSIS — I10 Essential (primary) hypertension: Secondary | ICD-10-CM | POA: Diagnosis not present

## 2020-12-15 DIAGNOSIS — Z951 Presence of aortocoronary bypass graft: Secondary | ICD-10-CM | POA: Diagnosis not present

## 2021-01-04 DIAGNOSIS — M65331 Trigger finger, right middle finger: Secondary | ICD-10-CM | POA: Diagnosis not present

## 2021-01-05 DIAGNOSIS — E119 Type 2 diabetes mellitus without complications: Secondary | ICD-10-CM | POA: Diagnosis not present

## 2021-01-06 DIAGNOSIS — H5213 Myopia, bilateral: Secondary | ICD-10-CM | POA: Diagnosis not present

## 2021-01-12 DIAGNOSIS — Z794 Long term (current) use of insulin: Secondary | ICD-10-CM | POA: Diagnosis not present

## 2021-01-12 DIAGNOSIS — E118 Type 2 diabetes mellitus with unspecified complications: Secondary | ICD-10-CM | POA: Diagnosis not present

## 2021-01-26 ENCOUNTER — Other Ambulatory Visit: Payer: Self-pay

## 2021-01-26 ENCOUNTER — Encounter: Payer: Self-pay | Admitting: *Deleted

## 2021-01-26 ENCOUNTER — Emergency Department
Admission: EM | Admit: 2021-01-26 | Discharge: 2021-01-26 | Discharge status: Against Medical Advice | Payer: Medicare Other | Attending: Emergency Medicine | Admitting: Emergency Medicine

## 2021-01-26 ENCOUNTER — Emergency Department: Payer: Medicare Other

## 2021-01-26 DIAGNOSIS — M25531 Pain in right wrist: Secondary | ICD-10-CM | POA: Insufficient documentation

## 2021-01-26 DIAGNOSIS — R079 Chest pain, unspecified: Secondary | ICD-10-CM | POA: Diagnosis not present

## 2021-01-26 DIAGNOSIS — Z951 Presence of aortocoronary bypass graft: Secondary | ICD-10-CM | POA: Insufficient documentation

## 2021-01-26 DIAGNOSIS — Z5321 Procedure and treatment not carried out due to patient leaving prior to being seen by health care provider: Secondary | ICD-10-CM | POA: Insufficient documentation

## 2021-01-26 LAB — CBC
HCT: 34.5 % — ABNORMAL LOW (ref 36.0–46.0)
Hemoglobin: 11.8 g/dL — ABNORMAL LOW (ref 12.0–15.0)
MCH: 29.6 pg (ref 26.0–34.0)
MCHC: 34.2 g/dL (ref 30.0–36.0)
MCV: 86.5 fL (ref 80.0–100.0)
Platelets: 263 10*3/uL (ref 150–400)
RBC: 3.99 MIL/uL (ref 3.87–5.11)
RDW: 14.9 % (ref 11.5–15.5)
WBC: 5.9 10*3/uL (ref 4.0–10.5)
nRBC: 0 % (ref 0.0–0.2)

## 2021-01-26 LAB — COMPREHENSIVE METABOLIC PANEL
ALT: 16 U/L (ref 0–44)
AST: 16 U/L (ref 15–41)
Albumin: 3.9 g/dL (ref 3.5–5.0)
Alkaline Phosphatase: 89 U/L (ref 38–126)
Anion gap: 5 (ref 5–15)
BUN: 19 mg/dL (ref 6–20)
CO2: 23 mmol/L (ref 22–32)
Calcium: 8.8 mg/dL — ABNORMAL LOW (ref 8.9–10.3)
Chloride: 107 mmol/L (ref 98–111)
Creatinine, Ser: 1.1 mg/dL — ABNORMAL HIGH (ref 0.44–1.00)
GFR, Estimated: >60 mL/min (ref >60)
Glucose, Bld: 131 mg/dL — ABNORMAL HIGH (ref 70–99)
Potassium: 3.8 mmol/L (ref 3.5–5.1)
Sodium: 135 mmol/L (ref 135–145)
Total Bilirubin: 0.9 mg/dL (ref 0.3–1.2)
Total Protein: 7.4 g/dL (ref 6.5–8.1)

## 2021-01-26 LAB — TROPONIN I (HIGH SENSITIVITY): Troponin I (High Sensitivity): 15 ng/L (ref <18)

## 2021-01-26 NOTE — ED Triage Notes (Signed)
PT to ED reporting chest pain for the past 2 days. Hx of CABG earlier this year. NO SOB, dizziness, lightheadedness or NVD. Pt also reporting pain in her right wrist with hx of trigger finger that she has not been able to get operated on due to the CABG and blood thinners.

## 2021-01-26 NOTE — ED Provider Notes (Signed)
Emergency Medicine Provider Triage Evaluation Note  Kayla Caldwell , a 45 y.o. female  was evaluated in triage.  Pt complains of mid central chest pain with some mild shortness of breath that radiates to the right arm.  Patient denies radiation to her back.  She had some nausea but no vomiting.  States that her chest pain started last night.  States that she has a history of prior CABG in June.  States that her chest pain is much stronger than usual.  No fever or recent consumption of acidic foods.  Denies recent weight gain.  Review of Systems  Positive: Patient has chest pain.  Negative: No abdominal pain.   Physical Exam  There were no vitals taken for this visit. Gen:   Awake, no distress  Resp:  Normal effort  MSK:   Moves extremities without difficulty  Other:    Medical Decision Making  Medically screening exam initiated at 3:36 PM.  Appropriate orders placed.  Laurenashley Viar was informed that the remainder of the evaluation will be completed by another provider, this initial triage assessment does not replace that evaluation, and the importance of remaining in the ED until their evaluation is complete.     Vallarie Mare [place], PA-C 01/26/21 1538    Vladimir Crofts, MD 01/26/21 1640

## 2021-01-26 NOTE — ED Notes (Signed)
Pt called back to repeat labs and not found in the lobby at this time

## 2021-01-28 ENCOUNTER — Other Ambulatory Visit: Payer: Self-pay | Admitting: Family Medicine

## 2021-01-28 DIAGNOSIS — Z794 Long term (current) use of insulin: Secondary | ICD-10-CM | POA: Diagnosis not present

## 2021-01-28 DIAGNOSIS — E119 Type 2 diabetes mellitus without complications: Secondary | ICD-10-CM | POA: Diagnosis not present

## 2021-01-28 DIAGNOSIS — Q839 Congenital malformation of breast, unspecified: Secondary | ICD-10-CM

## 2021-01-28 DIAGNOSIS — Z0000 Encounter for general adult medical examination without abnormal findings: Secondary | ICD-10-CM | POA: Diagnosis not present

## 2021-01-28 DIAGNOSIS — Z1231 Encounter for screening mammogram for malignant neoplasm of breast: Secondary | ICD-10-CM | POA: Diagnosis not present

## 2021-02-02 DIAGNOSIS — E119 Type 2 diabetes mellitus without complications: Secondary | ICD-10-CM | POA: Diagnosis not present

## 2021-02-02 DIAGNOSIS — M65331 Trigger finger, right middle finger: Secondary | ICD-10-CM | POA: Diagnosis not present

## 2021-02-02 DIAGNOSIS — Z794 Long term (current) use of insulin: Secondary | ICD-10-CM | POA: Diagnosis not present

## 2021-02-08 ENCOUNTER — Ambulatory Visit
Admission: RE | Admit: 2021-02-08 | Discharge: 2021-02-08 | Disposition: A | Discharge status: Home / Self Care | Payer: Medicare Other | Source: Ambulatory Visit | Attending: Family Medicine | Admitting: Family Medicine

## 2021-02-08 ENCOUNTER — Other Ambulatory Visit: Payer: Self-pay

## 2021-02-08 DIAGNOSIS — N644 Mastodynia: Secondary | ICD-10-CM | POA: Diagnosis not present

## 2021-02-08 DIAGNOSIS — Q839 Congenital malformation of breast, unspecified: Secondary | ICD-10-CM | POA: Diagnosis not present

## 2021-02-08 DIAGNOSIS — R922 Inconclusive mammogram: Secondary | ICD-10-CM | POA: Diagnosis not present

## 2021-02-11 DIAGNOSIS — E118 Type 2 diabetes mellitus with unspecified complications: Secondary | ICD-10-CM | POA: Diagnosis not present

## 2021-02-11 DIAGNOSIS — Z794 Long term (current) use of insulin: Secondary | ICD-10-CM | POA: Diagnosis not present

## 2021-02-15 ENCOUNTER — Other Ambulatory Visit: Payer: Self-pay | Admitting: Student

## 2021-02-15 DIAGNOSIS — M7582 Other shoulder lesions, left shoulder: Secondary | ICD-10-CM

## 2021-02-15 DIAGNOSIS — M25812 Other specified joint disorders, left shoulder: Secondary | ICD-10-CM | POA: Diagnosis not present

## 2021-02-15 DIAGNOSIS — M25512 Pain in left shoulder: Secondary | ICD-10-CM | POA: Diagnosis not present

## 2021-02-15 DIAGNOSIS — G8929 Other chronic pain: Secondary | ICD-10-CM | POA: Diagnosis not present

## 2021-02-15 DIAGNOSIS — M65331 Trigger finger, right middle finger: Secondary | ICD-10-CM | POA: Diagnosis not present

## 2021-02-17 DIAGNOSIS — H524 Presbyopia: Secondary | ICD-10-CM | POA: Diagnosis not present

## 2021-02-27 ENCOUNTER — Emergency Department: Payer: Medicare Other

## 2021-02-27 ENCOUNTER — Encounter: Payer: Self-pay | Admitting: Emergency Medicine

## 2021-02-27 ENCOUNTER — Emergency Department
Admission: EM | Admit: 2021-02-27 | Discharge: 2021-02-28 | Disposition: A | Discharge status: Home / Self Care | Payer: Medicare Other | Attending: Emergency Medicine | Admitting: Emergency Medicine

## 2021-02-27 ENCOUNTER — Other Ambulatory Visit: Payer: Self-pay

## 2021-02-27 DIAGNOSIS — E119 Type 2 diabetes mellitus without complications: Secondary | ICD-10-CM | POA: Insufficient documentation

## 2021-02-27 DIAGNOSIS — I2510 Atherosclerotic heart disease of native coronary artery without angina pectoris: Secondary | ICD-10-CM | POA: Insufficient documentation

## 2021-02-27 DIAGNOSIS — Z79899 Other long term (current) drug therapy: Secondary | ICD-10-CM | POA: Insufficient documentation

## 2021-02-27 DIAGNOSIS — Z951 Presence of aortocoronary bypass graft: Secondary | ICD-10-CM | POA: Insufficient documentation

## 2021-02-27 DIAGNOSIS — R079 Chest pain, unspecified: Secondary | ICD-10-CM | POA: Diagnosis not present

## 2021-02-27 DIAGNOSIS — R0789 Other chest pain: Secondary | ICD-10-CM | POA: Insufficient documentation

## 2021-02-27 DIAGNOSIS — Z7902 Long term (current) use of antithrombotics/antiplatelets: Secondary | ICD-10-CM | POA: Insufficient documentation

## 2021-02-27 DIAGNOSIS — J449 Chronic obstructive pulmonary disease, unspecified: Secondary | ICD-10-CM | POA: Diagnosis not present

## 2021-02-27 DIAGNOSIS — Z20822 Contact with and (suspected) exposure to covid-19: Secondary | ICD-10-CM | POA: Insufficient documentation

## 2021-02-27 DIAGNOSIS — I509 Heart failure, unspecified: Secondary | ICD-10-CM | POA: Diagnosis not present

## 2021-02-27 DIAGNOSIS — I110 Hypertensive heart disease with heart failure: Secondary | ICD-10-CM | POA: Insufficient documentation

## 2021-02-27 DIAGNOSIS — Z794 Long term (current) use of insulin: Secondary | ICD-10-CM | POA: Diagnosis not present

## 2021-02-27 DIAGNOSIS — Z7982 Long term (current) use of aspirin: Secondary | ICD-10-CM | POA: Insufficient documentation

## 2021-02-27 HISTORY — DX: Acute myocardial infarction, unspecified: I21.9

## 2021-02-27 LAB — BASIC METABOLIC PANEL
Anion gap: 7 (ref 5–15)
BUN: 15 mg/dL (ref 6–20)
CO2: 25 mmol/L (ref 22–32)
Calcium: 9 mg/dL (ref 8.9–10.3)
Chloride: 103 mmol/L (ref 98–111)
Creatinine, Ser: 0.73 mg/dL (ref 0.44–1.00)
GFR, Estimated: >60 mL/min (ref >60)
Glucose, Bld: 140 mg/dL — ABNORMAL HIGH (ref 70–99)
Potassium: 3.4 mmol/L — ABNORMAL LOW (ref 3.5–5.1)
Sodium: 135 mmol/L (ref 135–145)

## 2021-02-27 LAB — CBC
HCT: 33.8 % — ABNORMAL LOW (ref 36.0–46.0)
Hemoglobin: 11.7 g/dL — ABNORMAL LOW (ref 12.0–15.0)
MCH: 30 pg (ref 26.0–34.0)
MCHC: 34.6 g/dL (ref 30.0–36.0)
MCV: 86.7 fL (ref 80.0–100.0)
Platelets: 254 10*3/uL (ref 150–400)
RBC: 3.9 MIL/uL (ref 3.87–5.11)
RDW: 13.8 % (ref 11.5–15.5)
WBC: 7 10*3/uL (ref 4.0–10.5)
nRBC: 0 % (ref 0.0–0.2)

## 2021-02-27 LAB — TROPONIN I (HIGH SENSITIVITY): Troponin I (High Sensitivity): 14 ng/L (ref <18)

## 2021-02-27 MED ORDER — FENTANYL CITRATE PF 50 MCG/ML IJ SOSY
50.0000 ug | PREFILLED_SYRINGE | Freq: Once | INTRAMUSCULAR | Status: AC
  Administered 2021-02-28: 50 ug via INTRAVENOUS
  Filled 2021-02-27: qty 1

## 2021-02-27 MED ORDER — SODIUM CHLORIDE 0.9 % IV SOLN
12.5000 mg | Freq: Once | INTRAVENOUS | Status: AC
  Administered 2021-02-28: 12.5 mg via INTRAVENOUS
  Filled 2021-02-27: qty 0.5

## 2021-02-27 NOTE — ED Notes (Signed)
P stares allergy to nitroglycerin and that she took aspirin, tylenol and ranolazine w/no relief.  Pt states nausea, given zofran with no improvement.  Pt states nausea at this time, no vomiting.

## 2021-02-27 NOTE — ED Provider Notes (Addendum)
[facility] Emergency Department Provider Note  ____________________________________________   Event Date/Time   First MD Initiated Contact with Patient 02/27/21 2300     (approximate)  I have reviewed the triage vital signs and the nursing notes.   HISTORY  Chief Complaint Chest Pain    HPI Kayla Caldwell is a 45 y.o. female with history of CAD status post CABG in June 2022 in [place], hypertension, hyperlipidemia, CHF, COPD who presents to the emergency department with right-sided sharp chest pain that radiates into the left side of her chest that started around 5:30 PM tonight.  She states she took Tylenol at home and her Ranexa without any relief.  States she cannot take nitroglycerin because it causes her to become "unresponsive".  She states that this feels different than her anginal equivalent but she has had 3 previous heart attacks and each 1 felt different than the next.  She denies any shortness of breath but has had some nausea.  No vomiting.  No fevers or cough but has had chills.  Reports she has had some swelling in both of her legs.  States her pain is worse when she is lying flat.  Denies any history of PE or DVT.  No estrogen use.  Last hospitalization was in June.  States she was given Zofran by EMS without relief of her nausea.        Past Medical History:  Diagnosis Date   COPD (chronic obstructive pulmonary disease) ([place])    Diabetes ([place])    Heart disease    Heart failure ([place])    Hyperlipidemia    Hypertension    MI (myocardial infarction) [facility])     Patient Active Problem List   Diagnosis Date Noted   Class 3 severe obesity due to excess calories without serious comorbidity with body mass index (BMI) of 40.0 to 44.9 in adult [facility]) 10/27/2020   Atelectasis, left    Lobar pneumonia ([place]) 10/11/2020   CAD (coronary artery disease), native coronary artery 10/11/2020   Hx of CABG 10/11/2020   Pneumonia 10/11/2020   Diabetes  ([place])    Hypertension     Past Surgical History:  Procedure Laterality Date   ABDOMINAL HYSTERECTOMY     CORONARY ARTERY BYPASS GRAFT      Prior to Admission medications   Medication Sig Start Date End Date Taking? Authorizing Provider  albuterol (VENTOLIN HFA) 108 (90 Base) MCG/ACT inhaler Inhale 2 puffs into the lungs every 6 (six) hours as needed for wheezing or shortness of breath.    [provider]  amLODipine (NORVASC) 10 MG tablet Take 1 tablet (10 mg total) by mouth daily. 10/27/20   Cletis Athens, MD  aspirin 81 MG chewable tablet Chew 81 mg by mouth daily.    [provider]  atorvastatin (LIPITOR) 80 MG tablet Take 1 tablet (80 mg total) by mouth daily. 10/27/20   Cletis Athens, MD  bumetanide (BUMEX) 2 MG tablet Take 2 mg by mouth 2 (two) times daily.    [provider]  carvedilol (COREG) 25 MG tablet Take 1 tablet (25 mg total) by mouth 2 (two) times daily with a meal. 10/27/20   Cletis Athens, MD  clopidogrel (PLAVIX) 75 MG tablet Take 75 mg by mouth daily.    [provider]  Continuous Blood Gluc Sensor (FREESTYLE LIBRE 14 DAY SENSOR) MISC 1 each by Does not apply route every 14 (fourteen) days. 11/30/20   Cletis Athens, MD  ezetimibe (ZETIA) 10  MG tablet Take 1 tablet (10 mg total) by mouth daily. 10/27/20   Cletis Athens, MD  hydrOXYzine (ATARAX/VISTARIL) 10 MG tablet Take 1 tablet (10 mg total) by mouth every 8 (eight) hours as needed. 10/27/20   Cletis Athens, MD  insulin glargine (LANTUS) 100 UNIT/ML injection Inject 5 Units into the skin at bedtime.    [provider]  insulin lispro (HUMALOG) 100 UNIT/ML injection Inject 2 Units into the skin 3 (three) times daily before meals.    [provider]  magnesium oxide (MAG-OX) 400 MG tablet Take 400 mg by mouth daily.    [provider]  ondansetron (ZOFRAN ODT) 4 MG disintegrating tablet Allow 1-2 tablets to dissolve in your mouth every 8 hours as needed for  nausea/vomiting 11/18/20   Hinda Kehr, MD  pantoprazole (PROTONIX) 40 MG tablet Take 1 tablet (40 mg total) by mouth 2 (two) times daily. 10/27/20   Cletis Athens, MD  potassium chloride SA (KLOR-CON) 20 MEQ tablet Take 1 tablet (20 mEq total) by mouth daily. 11/10/20   Cletis Athens, MD  ranolazine (RANEXA) 1000 MG SR tablet Take 1 tablet (1,000 mg total) by mouth 2 (two) times daily. 10/27/20   Cletis Athens, MD  senna (SENOKOT) 8.6 MG TABS tablet Take 1 tablet (8.6 mg total) by mouth at bedtime. 10/27/20   Cletis Athens, MD    Allergies Compazine [prochlorperazine], Lisinopril, Naproxen, Nitroglycerin, Toradol [ketorolac tromethamine], and Tramadol  Family History  Problem Relation Age of Onset   Hypertension Mother    Heart disease Mother    Depression Father    Hypertension Father    Heart disease Father    Breast cancer Maternal Aunt    Cancer Maternal Aunt     Social History Social History   Tobacco Use   Smoking status: Never   Smokeless tobacco: Never  Vaping Use   Vaping Use: Never used  Substance Use Topics   Alcohol use: Never   Drug use: Never    Review of Systems Constitutional: No fever. Eyes: No visual changes. ENT: No sore throat. Cardiovascular: + chest pain. Respiratory: Denies shortness of breath. Gastrointestinal: No vomiting, diarrhea. Genitourinary: Negative for dysuria. Musculoskeletal: Negative for back pain. Skin: Negative for rash. Neurological: Negative for focal weakness or numbness.  ____________________________________________   PHYSICAL EXAM:  VITAL SIGNS: ED Triage Vitals [02/27/21 2157]  Enc Vitals Group     BP (!) 151/106     Pulse Rate 69     Resp 18     Temp 98.2 F (36.8 C)     Temp src      SpO2 99 %     Weight 232 lb (105.2 kg)     Height 5\' 3"  (1.6 m)     Head Circumference      Peak Flow      Pain Score 9     Pain Loc      Pain Edu?      Excl. in [place]?    CONSTITUTIONAL: Alert and oriented and responds  appropriately to questions. Well-appearing; well-nourished HEAD: Normocephalic EYES: Conjunctivae clear, pupils appear equal, EOM appear intact ENT: normal nose; moist mucous membranes NECK: Supple, normal ROM CARD: RRR; S1 and S2 appreciated; no murmurs, no clicks, no rubs, no gallops CHEST:  Chest wall is tender to palpation.  No crepitus, ecchymosis, erythema, warmth, rash or other lesions present.   RESP: Normal chest excursion without splinting or tachypnea; breath sounds clear and equal bilaterally; no wheezes, no rhonchi,  no rales, no hypoxia or respiratory distress, speaking full sentences ABD/GI: Normal bowel sounds; non-distended; soft, non-tender, no rebound, no guarding, no peritoneal signs, no hepatosplenomegaly BACK: The back appears normal EXT: Normal ROM in all joints; no deformity noted, no edema; no cyanosis, no calf tenderness or calf swelling SKIN: Normal color for age and race; warm; no rash on exposed skin NEURO: Moves all extremities equally PSYCH: The patient's mood and manner are appropriate.  ____________________________________________   LABS (all labs ordered are listed, but only abnormal results are displayed)  Labs Reviewed  BASIC METABOLIC PANEL - Abnormal; Notable for the following components:      Result Value   Potassium 3.4 (*)    Glucose, Bld 140 (*)    All other components within normal limits  CBC - Abnormal; Notable for the following components:   Hemoglobin 11.7 (*)    HCT 33.8 (*)    All other components within normal limits  RESP PANEL BY RT-PCR (FLU A&B, COVID) ARPGX2  D-DIMER, QUANTITATIVE  PROCALCITONIN  BRAIN NATRIURETIC PEPTIDE  TROPONIN I (HIGH SENSITIVITY)  TROPONIN I (HIGH SENSITIVITY)   ____________________________________________  EKG   EKG Interpretation  Date/Time:  Saturday February 27 2021 22:03:55 EST Ventricular Rate:  70 PR Interval:  168 QRS Duration: 94 QT Interval:  468 QTC Calculation: 505 R  Axis:   31 Text Interpretation: Normal sinus rhythm ST & T wave abnormality, consider lateral ischemia Prolonged QT Abnormal ECG When compared with ECG of 26-Jan-2021 15:30, No significant change was found Confirmed by Pryor Curia [phone]) on 02/27/2021 11:23:49 PM        ____________________________________________  RADIOLOGY Jessie Foot Hanley Ris[place], personally viewed and evaluated these images (plain radiographs) as part of my medical decision making, as well as reviewing the written report by the radiologist.  ED MD interpretation: Chest x-ray clear.  Official radiology report(s): DG Chest 2 View  Result Date: 02/27/2021 CLINICAL DATA:  chest pain EXAM: CHEST - 2 VIEW COMPARISON:  Chest x-ray 01/26/2021, CT chest 11/18/20. FINDINGS: The heart and mediastinal contours are within normal limits. Surgical changes overlie the mediastinum. Hazy airspace opacity within the left lower lobe. No pulmonary edema. No pleural effusion. No pneumothorax. No acute osseous abnormality. IMPRESSION: Hazy airspace opacity within the left lower lobe. Finding may represent developing infection/inflammation. Followup PA and lateral chest X-ray is recommended in 3-4 weeks following therapy to ensure resolution. Electronically Signed   By: Iven Finn M.D.   On: 02/27/2021 22:23    ____________________________________________   PROCEDURES  Procedure(s) performed (including Critical Care):  Procedures  ____________________________________________   INITIAL IMPRESSION / ASSESSMENT AND PLAN / ED COURSE  As part of my medical decision making, I reviewed the following data within the [place] notes reviewed and incorporated, Labs reviewed , EKG interpreted , Old EKG reviewed, Old chart reviewed, Radiograph reviewed , and Notes from prior ED visits       Patient here with complaints of chest pain.  Differential includes chest wall pain, ACS, PE, dissection, pneumonia, CHF,  pneumothorax, esophageal spasm, GERD.  EKG here reviewed and shows no change compared to previous.  First troponin obtained from triage is normal.  Chest x-ray however reviewed by myself and radiologist concerning for possible developing pneumonia in the left lower lobe however she has no infectious symptoms other than some mild chills today.  Afebrile currently.  Second troponin pending.  We will also obtain D-dimer to evaluate for PE.  Will obtain procalcitonin given concerns  for possible pneumonia developing on x-ray.  We will also obtain COVID and flu swabs.  Will give fentanyl, Phenergan for symptomatic relief.  ED PROGRESS  Patient's second troponin is negative.  BNP normal.  Procalcitonin negative.  COVID and flu negative.  D-dimer negative.  Suspect pain is musculoskeletal in nature given it is reproducible palpation and seems very atypical for ACS.  She reports feeling better and is tolerating p.o. here.  I feel she is safe for discharge home with close outpatient follow-up.  Discussed return precautions.  Patient verbalized understanding and is comfortable with this plan.  At this time, I do not feel there is any life-threatening condition present. I have reviewed, interpreted and discussed all results (EKG, imaging, lab, urine as appropriate) and exam findings with patient/family. I have reviewed nursing notes and appropriate previous records.  I feel the patient is safe to be discharged home without further emergent workup and can continue workup as an outpatient as needed. Discussed usual and customary return precautions. Patient/family verbalize understanding and are comfortable with this plan.  Outpatient follow-up has been provided as needed. All questions have been answered.  ____________________________________________   FINAL CLINICAL IMPRESSION(S) / ED DIAGNOSES  Final diagnoses:  Atypical chest pain     ED Discharge Orders     None       *Please note:  Niyla Marone  was evaluated in Emergency Department on 02/28/2021 for the symptoms described in the history of present illness. She was evaluated in the context of the global COVID-19 pandemic, which necessitated consideration that the patient might be at risk for infection with the SARS-CoV-2 virus that causes COVID-19. Institutional protocols and algorithms that pertain to the evaluation of patients at risk for COVID-19 are in a state of rapid change based on information released by regulatory bodies including the CDC and federal and state organizations. These policies and algorithms were followed during the patient's care in the ED.  Some ED evaluations and interventions may be delayed as a result of limited staffing during and the pandemic.*   Note:  This document was prepared using Dragon voice recognition software and may include unintentional dictation errors.    Lekendrick Alpern, Delice Bison, DO 02/28/21 [place], Delice Bison, DO 02/28/21 [phone]

## 2021-02-27 NOTE — ED Triage Notes (Signed)
Pt BIB EMS from home with c/o generalized chest pain that started x 2 hours ago. Pt took 324 ASA and tylenol. Pt has hx cabg and stent placement

## 2021-02-28 LAB — RESP PANEL BY RT-PCR (FLU A&B, COVID) ARPGX2
Influenza A by PCR: NEGATIVE
Influenza B by PCR: NEGATIVE
SARS Coronavirus 2 by RT PCR: NEGATIVE

## 2021-02-28 LAB — D-DIMER, QUANTITATIVE: D-Dimer, Quant: 0.37 ug/mL-FEU (ref 0.00–0.50)

## 2021-02-28 LAB — BRAIN NATRIURETIC PEPTIDE: B Natriuretic Peptide: 84.3 pg/mL (ref 0.0–100.0)

## 2021-02-28 LAB — PROCALCITONIN: Procalcitonin: <0.1 ng/mL

## 2021-02-28 LAB — TROPONIN I (HIGH SENSITIVITY): Troponin I (High Sensitivity): 13 ng/L (ref <18)

## 2021-03-12 ENCOUNTER — Other Ambulatory Visit: Payer: Self-pay | Admitting: Internal Medicine

## 2021-03-12 DIAGNOSIS — E876 Hypokalemia: Secondary | ICD-10-CM

## 2021-03-18 ENCOUNTER — Other Ambulatory Visit: Payer: Self-pay

## 2021-03-18 ENCOUNTER — Encounter: Payer: Self-pay | Admitting: *Deleted

## 2021-03-18 ENCOUNTER — Emergency Department: Payer: 59

## 2021-03-18 DIAGNOSIS — I2510 Atherosclerotic heart disease of native coronary artery without angina pectoris: Secondary | ICD-10-CM | POA: Diagnosis not present

## 2021-03-18 DIAGNOSIS — R059 Cough, unspecified: Secondary | ICD-10-CM | POA: Diagnosis present

## 2021-03-18 DIAGNOSIS — J209 Acute bronchitis, unspecified: Secondary | ICD-10-CM | POA: Insufficient documentation

## 2021-03-18 DIAGNOSIS — Z20822 Contact with and (suspected) exposure to covid-19: Secondary | ICD-10-CM | POA: Diagnosis not present

## 2021-03-18 DIAGNOSIS — J441 Chronic obstructive pulmonary disease with (acute) exacerbation: Secondary | ICD-10-CM | POA: Insufficient documentation

## 2021-03-18 DIAGNOSIS — I509 Heart failure, unspecified: Secondary | ICD-10-CM | POA: Insufficient documentation

## 2021-03-18 DIAGNOSIS — I110 Hypertensive heart disease with heart failure: Secondary | ICD-10-CM | POA: Diagnosis not present

## 2021-03-18 DIAGNOSIS — M791 Myalgia, unspecified site: Secondary | ICD-10-CM | POA: Diagnosis not present

## 2021-03-18 DIAGNOSIS — Z951 Presence of aortocoronary bypass graft: Secondary | ICD-10-CM | POA: Diagnosis not present

## 2021-03-18 LAB — CBC
HCT: 35.7 % — ABNORMAL LOW (ref 36.0–46.0)
Hemoglobin: 12.3 g/dL (ref 12.0–15.0)
MCH: 30.1 pg (ref 26.0–34.0)
MCHC: 34.5 g/dL (ref 30.0–36.0)
MCV: 87.5 fL (ref 80.0–100.0)
Platelets: 248 10*3/uL (ref 150–400)
RBC: 4.08 MIL/uL (ref 3.87–5.11)
RDW: 13.3 % (ref 11.5–15.5)
WBC: 6.7 10*3/uL (ref 4.0–10.5)
nRBC: 0 % (ref 0.0–0.2)

## 2021-03-18 LAB — BASIC METABOLIC PANEL
Anion gap: 6 (ref 5–15)
BUN: 16 mg/dL (ref 6–20)
CO2: 24 mmol/L (ref 22–32)
Calcium: 9.1 mg/dL (ref 8.9–10.3)
Chloride: 106 mmol/L (ref 98–111)
Creatinine, Ser: 0.86 mg/dL (ref 0.44–1.00)
GFR, Estimated: >60 mL/min (ref >60)
Glucose, Bld: 167 mg/dL — ABNORMAL HIGH (ref 70–99)
Potassium: 3.9 mmol/L (ref 3.5–5.1)
Sodium: 136 mmol/L (ref 135–145)

## 2021-03-18 LAB — TROPONIN I (HIGH SENSITIVITY): Troponin I (High Sensitivity): 12 ng/L (ref <18)

## 2021-03-18 NOTE — ED Triage Notes (Signed)
Pt has left side chest pain beneath the left breast x 2 hours   pt also has a cough.  Nonsmoker.  Pt alert  speech clear.

## 2021-03-19 ENCOUNTER — Emergency Department
Admission: EM | Admit: 2021-03-19 | Discharge: 2021-03-19 | Disposition: A | Discharge status: Home / Self Care | Payer: 59 | Attending: Emergency Medicine | Admitting: Emergency Medicine

## 2021-03-19 DIAGNOSIS — J40 Bronchitis, not specified as acute or chronic: Secondary | ICD-10-CM

## 2021-03-19 DIAGNOSIS — J209 Acute bronchitis, unspecified: Secondary | ICD-10-CM | POA: Diagnosis not present

## 2021-03-19 LAB — TROPONIN I (HIGH SENSITIVITY): Troponin I (High Sensitivity): 11 ng/L (ref <18)

## 2021-03-19 LAB — RESP PANEL BY RT-PCR (FLU A&B, COVID) ARPGX2
Influenza A by PCR: NEGATIVE
Influenza B by PCR: NEGATIVE
SARS Coronavirus 2 by RT PCR: NEGATIVE

## 2021-03-19 MED ORDER — OXYCODONE HCL 5 MG PO TABS
5.0000 mg | ORAL_TABLET | Freq: Once | ORAL | Status: AC
Start: 2021-03-19 — End: 2021-03-19
  Administered 2021-03-19: 5 mg via ORAL
  Filled 2021-03-19: qty 1

## 2021-03-19 MED ORDER — AZITHROMYCIN 250 MG PO TABS: ORAL_TABLET | ORAL | 0 refills | Status: AC

## 2021-03-19 MED ORDER — GUAIFENESIN-CODEINE 100-10 MG/5ML PO SYRP: 5.0000 mL | ORAL_SOLUTION | Freq: Three times a day (TID) | ORAL | 0 refills | Status: AC | PRN

## 2021-03-19 MED ORDER — LIDOCAINE 5 % EX PTCH
1.0000 | MEDICATED_PATCH | CUTANEOUS | Status: DC
  Administered 2021-03-19: 1 via TRANSDERMAL
  Filled 2021-03-19: qty 1

## 2021-03-19 MED ORDER — ALBUTEROL SULFATE HFA 108 (90 BASE) MCG/ACT IN AERS: 2.0000 | INHALATION_SPRAY | Freq: Four times a day (QID) | RESPIRATORY_TRACT | 2 refills | Status: AC | PRN

## 2021-03-19 MED ORDER — IPRATROPIUM-ALBUTEROL 0.5-2.5 (3) MG/3ML IN SOLN
3.0000 mL | Freq: Once | RESPIRATORY_TRACT | Status: AC
  Administered 2021-03-19: 3 mL via RESPIRATORY_TRACT
  Filled 2021-03-19: qty 3

## 2021-03-19 MED ORDER — AMOXICILLIN-POT CLAVULANATE 875-125 MG PO TABS: 1.0000 | ORAL_TABLET | Freq: Two times a day (BID) | ORAL | 0 refills | Status: AC

## 2021-03-19 NOTE — ED Provider Notes (Signed)
[facility] Provider Note    Event Date/Time   First MD Initiated Contact with Patient 03/19/21 0235     (approximate)   History   Chest Pain   HPI  Kayla Caldwell is a 46 y.o. female   CAD status post CABG in June 2022 in [place], hypertension, hyperlipidemia, CHF, COPD who comes in with chest pain.  Patient also reports a cough.  Patient reports 2 to 3 days of just feeling unwell.  She reports having a cough.  She states she has some body aches.  No known COVID contacts and has had her COVID vaccines.  She reports a little bit of shortness of breath but nothing too significant.  Then today she started develop some left-sided chest pain.  Denies any pain in her abdomen.  Patient does report history of COPD but denies that she smokes any longer.  Patient reports that her cough is productive, very frequently.  Occasionally is dry but reports that it hurts when she coughs   On review of records patient was seen back in December 2022 for chest pain.  At that time she had negative D-dimer, chest x-ray concern for left lower lobe pneumonia but she had no infectious symptoms therefore treatment was held off.  She followed up in January with Dr. Elon Jester on January 5.  I reviewed this note.  It appears that he increased her Bumex and referred her to cardiac rehab.        Physical Exam   Triage Vital Signs: ED Triage Vitals  Enc Vitals Group     BP 03/18/21 2209 (!) 174/115     Pulse Rate 03/18/21 2209 74     Resp 03/18/21 2209 20     Temp 03/18/21 2209 98 F (36.7 C)     Temp Source 03/18/21 2209 Oral     SpO2 03/18/21 2209 97 %     Weight 03/18/21 2213 240 lb (108.9 kg)     Height 03/18/21 2213 5\' 3"  (1.6 m)     Head Circumference --      Peak Flow --      Pain Score 03/18/21 2213 8     Pain Loc --      Pain Edu? --      Excl. in [place]? --     Most recent vital signs: Vitals:   03/19/21 0032 03/19/21 0300  BP: (!) 198/106 (!) 166/89  Pulse: 85 82   Resp: 20 18  Temp:    SpO2: 99% 98%     General: Awake, no distress.  Well-appearing CV:  Good peripheral perfusion.  Resp:  Normal effort.  Mild wheezing noted.  Patient intermittently coughing very loud Abd:  No distention.  Soft and nontender Other:  No rash noted, no swelling in the legs.  No calf tenderness   ED Results / Procedures / Treatments   Labs (all labs ordered are listed, but only abnormal results are displayed) Labs Reviewed  BASIC METABOLIC PANEL - Abnormal; Notable for the following components:      Result Value   Glucose, Bld 167 (*)    All other components within normal limits  CBC - Abnormal; Notable for the following components:   HCT 35.7 (*)    All other components within normal limits  TROPONIN I (HIGH SENSITIVITY)  TROPONIN I (HIGH SENSITIVITY)     EKG  My interpretation of EKG:  Normal sinus rate of 75 without any ST elevation, no T wave inversions  except for aVL, normal intervals  Reviewed the prior EKG and it looks similar  RADIOLOGY I have reviewed the xray personally and agree with radiology read there is no evidence of pneumonia   PROCEDURES:  Critical Care performed: No  .1-3 Lead EKG Interpretation Performed by: Vanessa [place], MD Authorized by: Vanessa [place], MD     Interpretation: normal     ECG rate:  80   ECG rate assessment: normal     Rhythm: sinus rhythm     Ectopy: none     Conduction: normal     MEDICATIONS ORDERED IN ED: Medications  lidocaine (LIDODERM) 5 % 1 patch (has no administration in time range)  ipratropium-albuterol (DUONEB) 0.5-2.5 (3) MG/3ML nebulizer solution 3 mL (has no administration in time range)  oxyCODONE (Oxy IR/ROXICODONE) immediate release tablet 5 mg (5 mg Oral Given 03/19/21 0334)     IMPRESSION / MDM / ASSESSMENT AND PLAN / ED COURSE  I reviewed the triage vital signs and the nursing notes.  Differential diagnosis includes, but is not limited to, most likely COPD exacerbation  versus electrolyte abnormalities versus anemia versus ACS.  She is nontender with pushing on it but she is had some musculoskeletal chest pain previously.  Chest x-ray to rule out pneumonia, COVID, flu  Given patient has some wheezing on exam I suspect that this is some COPD exacerbation the cough is causing her to have some chest pain.  She does have a little bit of wheezing so given DuoNeb.  Offered patient steroids but she declined given her history of diabetes.  BMP shows slightly elevated sugar but no evidence of DKA.  Kidney function is stable CBC without any evidence of white count elevation to suggest severe infection.  Hemoglobin is stable no anemia Troponin was negative x2 She had an x-ray without evidence of any pneumonia  Given patient's history of COPD and the wheezing and the very productive cough we will start patient on Augmentin and azithromycin.  We will give some cough and codeine syrup for the cough.  She understands to take this as needed but that it can cause some lightheadedness and do not drive on it given it does have an opioid in it.  I suspect that her chest pain is more likely related to a pneumonia just not being seen on the chest x-ray versus a bronchitis from all of her coughing her COVID and flu test were negative.  She feels comfortable with this plan.  The patient is on the cardiac monitor to evaluate for evidence of arrhythmia and/or significant heart rate changes.  FINAL CLINICAL IMPRESSION(S) / ED DIAGNOSES   Final diagnoses:  Bronchitis     Rx / DC Orders   ED Discharge Orders          Ordered    albuterol (VENTOLIN HFA) 108 (90 Base) MCG/ACT inhaler  Every 6 hours PRN        03/19/21 0600    amoxicillin-clavulanate (AUGMENTIN) 875-125 MG tablet  2 times daily        03/19/21 0600    azithromycin (ZITHROMAX Z-PAK) 250 MG tablet        03/19/21 0600    guaiFENesin-codeine (ROBITUSSIN AC) 100-10 MG/5ML syrup  3 times daily PRN        03/19/21 0600              Note:  This document was prepared using Dragon voice recognition software and may include unintentional dictation errors.  Vanessa [place], MD 03/19/21 [phone]

## 2021-03-19 NOTE — Discharge Instructions (Signed)
We are prescribing antibiotics for possible pneumonia versus bronchitis.  We have also prescribed you an inhaler.  Return to ER if develop worsening pain or any other concerns.  There is no evidence of heart attack today  Take oxycodone as prescribed. Do not drink alcohol, drive or participate in any other potentially dangerous activities while taking this medication as it may make you sleepy. Do not take this medication with any other sedating medications, either prescription or over-the-counter. If you were prescribed Percocet or Vicodin, do not take these with acetaminophen (Tylenol) as it is already contained within these medications.  This medication is an opiate (or narcotic) pain medication and can be habit forming. Use it as little as possible to achieve adequate pain control. Do not use or use it with extreme caution if you have a history of opiate abuse or dependence. If you are on a pain contract with your primary care doctor or a pain specialist, be sure to let them know you were prescribed this medication today from the Emergency Department. This medication is intended for your use only - do not give any to anyone else and keep it in a secure place where nobody else, especially children, have access to it.

## 2021-03-19 NOTE — ED Notes (Signed)
E-signature pad unavailable - Pt verbalized understanding of D/C information - no additional concerns at this time.  

## 2021-03-24 ENCOUNTER — Ambulatory Visit
Admission: RE | Admit: 2021-03-24 | Discharge: 2021-03-24 | Disposition: A | Discharge status: Home / Self Care | Payer: 59 | Source: Ambulatory Visit | Attending: Family Medicine | Admitting: Family Medicine

## 2021-03-24 ENCOUNTER — Other Ambulatory Visit: Payer: Self-pay

## 2021-03-24 ENCOUNTER — Other Ambulatory Visit: Payer: Self-pay | Admitting: Family Medicine

## 2021-03-24 DIAGNOSIS — R1013 Epigastric pain: Secondary | ICD-10-CM | POA: Diagnosis present

## 2021-03-24 DIAGNOSIS — R197 Diarrhea, unspecified: Secondary | ICD-10-CM | POA: Diagnosis present

## 2021-03-24 DIAGNOSIS — R110 Nausea: Secondary | ICD-10-CM

## 2021-03-24 MED ORDER — IOHEXOL 300 MG/ML  SOLN
100.0000 mL | Freq: Once | INTRAMUSCULAR | Status: AC | PRN
  Administered 2021-03-24: 15:00:00 100 mL via INTRAVENOUS

## 2021-04-11 ENCOUNTER — Emergency Department: Payer: 59

## 2021-04-11 ENCOUNTER — Encounter: Payer: Self-pay | Admitting: Radiology

## 2021-04-11 ENCOUNTER — Other Ambulatory Visit: Payer: Self-pay

## 2021-04-11 ENCOUNTER — Observation Stay
Admission: EM | Admit: 2021-04-11 | Discharge: 2021-04-12 | Disposition: A | Discharge status: Home / Self Care | Payer: 59 | Attending: Internal Medicine | Admitting: Internal Medicine

## 2021-04-11 ENCOUNTER — Observation Stay: Payer: 59

## 2021-04-11 DIAGNOSIS — I10 Essential (primary) hypertension: Secondary | ICD-10-CM | POA: Diagnosis present

## 2021-04-11 DIAGNOSIS — I5032 Chronic diastolic (congestive) heart failure: Secondary | ICD-10-CM | POA: Diagnosis not present

## 2021-04-11 DIAGNOSIS — I2510 Atherosclerotic heart disease of native coronary artery without angina pectoris: Secondary | ICD-10-CM | POA: Diagnosis not present

## 2021-04-11 DIAGNOSIS — E785 Hyperlipidemia, unspecified: Secondary | ICD-10-CM | POA: Diagnosis present

## 2021-04-11 DIAGNOSIS — I110 Hypertensive heart disease with heart failure: Secondary | ICD-10-CM | POA: Insufficient documentation

## 2021-04-11 DIAGNOSIS — R0789 Other chest pain: Principal | ICD-10-CM | POA: Insufficient documentation

## 2021-04-11 DIAGNOSIS — E66813 Obesity, class 3: Secondary | ICD-10-CM | POA: Diagnosis present

## 2021-04-11 DIAGNOSIS — Z79899 Other long term (current) drug therapy: Secondary | ICD-10-CM | POA: Insufficient documentation

## 2021-04-11 DIAGNOSIS — R7989 Other specified abnormal findings of blood chemistry: Secondary | ICD-10-CM | POA: Insufficient documentation

## 2021-04-11 DIAGNOSIS — Z951 Presence of aortocoronary bypass graft: Secondary | ICD-10-CM | POA: Insufficient documentation

## 2021-04-11 DIAGNOSIS — E876 Hypokalemia: Secondary | ICD-10-CM | POA: Diagnosis present

## 2021-04-11 DIAGNOSIS — Z794 Long term (current) use of insulin: Secondary | ICD-10-CM | POA: Insufficient documentation

## 2021-04-11 DIAGNOSIS — J449 Chronic obstructive pulmonary disease, unspecified: Secondary | ICD-10-CM | POA: Diagnosis not present

## 2021-04-11 DIAGNOSIS — Z7902 Long term (current) use of antithrombotics/antiplatelets: Secondary | ICD-10-CM | POA: Insufficient documentation

## 2021-04-11 DIAGNOSIS — R079 Chest pain, unspecified: Secondary | ICD-10-CM | POA: Diagnosis present

## 2021-04-11 DIAGNOSIS — E119 Type 2 diabetes mellitus without complications: Secondary | ICD-10-CM | POA: Diagnosis not present

## 2021-04-11 LAB — BASIC METABOLIC PANEL
Anion gap: 9 (ref 5–15)
BUN: 12 mg/dL (ref 6–20)
CO2: 24 mmol/L (ref 22–32)
Calcium: 9 mg/dL (ref 8.9–10.3)
Chloride: 101 mmol/L (ref 98–111)
Creatinine, Ser: 0.96 mg/dL (ref 0.44–1.00)
GFR, Estimated: >60 mL/min (ref >60)
Glucose, Bld: 128 mg/dL — ABNORMAL HIGH (ref 70–99)
Potassium: 3.3 mmol/L — ABNORMAL LOW (ref 3.5–5.1)
Sodium: 134 mmol/L — ABNORMAL LOW (ref 135–145)

## 2021-04-11 LAB — CBC
HCT: 37.5 % (ref 36.0–46.0)
Hemoglobin: 12.6 g/dL (ref 12.0–15.0)
MCH: 29.3 pg (ref 26.0–34.0)
MCHC: 33.6 g/dL (ref 30.0–36.0)
MCV: 87.2 fL (ref 80.0–100.0)
Platelets: 340 10*3/uL (ref 150–400)
RBC: 4.3 MIL/uL (ref 3.87–5.11)
RDW: 12.8 % (ref 11.5–15.5)
WBC: 6.7 10*3/uL (ref 4.0–10.5)
nRBC: 0 % (ref 0.0–0.2)

## 2021-04-11 LAB — TROPONIN I (HIGH SENSITIVITY)
Troponin I (High Sensitivity): 14 ng/L (ref <18)
Troponin I (High Sensitivity): 16 ng/L (ref <18)
Troponin I (High Sensitivity): 18 ng/L — ABNORMAL HIGH (ref <18)
Troponin I (High Sensitivity): 15 ng/L (ref <18)

## 2021-04-11 LAB — HEMOGLOBIN A1C
Hgb A1c MFr Bld: 6.9 % — ABNORMAL HIGH (ref 4.8–5.6)
Mean Plasma Glucose: 151.33 mg/dL

## 2021-04-11 LAB — D-DIMER, QUANTITATIVE: D-Dimer, Quant: 0.55 ug/mL-FEU — ABNORMAL HIGH (ref 0.00–0.50)

## 2021-04-11 LAB — MAGNESIUM: Magnesium: 2 mg/dL (ref 1.7–2.4)

## 2021-04-11 LAB — GLUCOSE, CAPILLARY
Glucose-Capillary: 113 mg/dL — ABNORMAL HIGH (ref 70–99)
Glucose-Capillary: 249 mg/dL — ABNORMAL HIGH (ref 70–99)

## 2021-04-11 MED ORDER — RANOLAZINE ER 500 MG PO TB12
1000.0000 mg | ORAL_TABLET | Freq: Two times a day (BID) | ORAL | Status: DC
  Administered 2021-04-11 – 2021-04-12 (×2): 1000 mg via ORAL
  Filled 2021-04-11 (×3): qty 2

## 2021-04-11 MED ORDER — DM-GUAIFENESIN ER 30-600 MG PO TB12: 1.0000 | ORAL_TABLET | Freq: Two times a day (BID) | ORAL | Status: DC | PRN

## 2021-04-11 MED ORDER — AMLODIPINE BESYLATE 10 MG PO TABS
10.0000 mg | ORAL_TABLET | Freq: Every day | ORAL | Status: DC
  Administered 2021-04-11 – 2021-04-12 (×2): 10 mg via ORAL
  Filled 2021-04-11 (×2): qty 1

## 2021-04-11 MED ORDER — INSULIN GLARGINE-YFGN 100 UNIT/ML ~~LOC~~ SOLN
3.0000 [IU] | Freq: Every day | SUBCUTANEOUS | Status: DC
  Filled 2021-04-11 (×3): qty 0.03

## 2021-04-11 MED ORDER — SODIUM CHLORIDE 0.9 % IV SOLN
25.0000 mg | Freq: Four times a day (QID) | INTRAVENOUS | Status: DC | PRN
  Administered 2021-04-11 – 2021-04-12 (×2): 25 mg via INTRAVENOUS
  Filled 2021-04-11 (×2): qty 1

## 2021-04-11 MED ORDER — ACETAMINOPHEN 325 MG PO TABS: 650.0000 mg | ORAL_TABLET | Freq: Four times a day (QID) | ORAL | Status: DC | PRN

## 2021-04-11 MED ORDER — HYDROXYZINE HCL 10 MG PO TABS
10.0000 mg | ORAL_TABLET | Freq: Three times a day (TID) | ORAL | Status: DC | PRN
  Filled 2021-04-11: qty 1

## 2021-04-11 MED ORDER — ALBUTEROL SULFATE (2.5 MG/3ML) 0.083% IN NEBU: 3.0000 mL | INHALATION_SOLUTION | Freq: Four times a day (QID) | RESPIRATORY_TRACT | Status: DC | PRN

## 2021-04-11 MED ORDER — ONDANSETRON 4 MG PO TBDP
4.0000 mg | ORAL_TABLET | Freq: Once | ORAL | Status: AC
  Administered 2021-04-11: 4 mg via ORAL
  Filled 2021-04-11: qty 1

## 2021-04-11 MED ORDER — HYDROMORPHONE HCL 1 MG/ML IJ SOLN
1.0000 mg | INTRAMUSCULAR | Status: DC | PRN
  Administered 2021-04-12 (×2): 1 mg via INTRAVENOUS
  Filled 2021-04-11 (×2): qty 1

## 2021-04-11 MED ORDER — LABETALOL HCL 5 MG/ML IV SOLN
10.0000 mg | Freq: Once | INTRAVENOUS | Status: AC
  Administered 2021-04-11: 10 mg via INTRAVENOUS
  Filled 2021-04-11: qty 4

## 2021-04-11 MED ORDER — ENOXAPARIN SODIUM 40 MG/0.4ML IJ SOSY: 40.0000 mg | PREFILLED_SYRINGE | INTRAMUSCULAR | Status: DC

## 2021-04-11 MED ORDER — ASPIRIN EC 81 MG PO TBEC
81.0000 mg | DELAYED_RELEASE_TABLET | Freq: Every day | ORAL | Status: DC
  Administered 2021-04-12: 81 mg via ORAL
  Filled 2021-04-11: qty 1

## 2021-04-11 MED ORDER — INSULIN ASPART 100 UNIT/ML IJ SOLN
0.0000 [IU] | Freq: Three times a day (TID) | INTRAMUSCULAR | Status: DC
  Administered 2021-04-12 (×2): 1 [IU] via SUBCUTANEOUS
  Filled 2021-04-11 (×2): qty 1

## 2021-04-11 MED ORDER — MAGNESIUM OXIDE 400 MG PO TABS
400.0000 mg | ORAL_TABLET | Freq: Every day | ORAL | Status: DC
  Administered 2021-04-11 – 2021-04-12 (×2): 400 mg via ORAL
  Filled 2021-04-11 (×3): qty 1

## 2021-04-11 MED ORDER — CLOPIDOGREL BISULFATE 75 MG PO TABS
75.0000 mg | ORAL_TABLET | Freq: Every day | ORAL | Status: DC
  Administered 2021-04-11 – 2021-04-12 (×2): 75 mg via ORAL
  Filled 2021-04-11 (×2): qty 1

## 2021-04-11 MED ORDER — ATORVASTATIN CALCIUM 80 MG PO TABS
80.0000 mg | ORAL_TABLET | Freq: Every day | ORAL | Status: DC
  Administered 2021-04-11 – 2021-04-12 (×2): 80 mg via ORAL
  Filled 2021-04-11 (×2): qty 1

## 2021-04-11 MED ORDER — SENNA 8.6 MG PO TABS
1.0000 | ORAL_TABLET | Freq: Every day | ORAL | Status: DC
  Administered 2021-04-11: 8.6 mg via ORAL
  Filled 2021-04-11: qty 1

## 2021-04-11 MED ORDER — MORPHINE SULFATE (PF) 2 MG/ML IV SOLN: 2.0000 mg | INTRAVENOUS | Status: DC | PRN

## 2021-04-11 MED ORDER — HYDRALAZINE HCL 20 MG/ML IJ SOLN
5.0000 mg | INTRAMUSCULAR | Status: DC | PRN
  Filled 2021-04-11: qty 1

## 2021-04-11 MED ORDER — ONDANSETRON HCL 4 MG/2ML IJ SOLN
4.0000 mg | Freq: Once | INTRAMUSCULAR | Status: AC
  Administered 2021-04-11: 4 mg via INTRAVENOUS
  Filled 2021-04-11: qty 2

## 2021-04-11 MED ORDER — IOHEXOL 350 MG/ML SOLN
100.0000 mL | Freq: Once | INTRAVENOUS | Status: AC | PRN
  Administered 2021-04-11: 100 mL via INTRAVENOUS

## 2021-04-11 MED ORDER — MORPHINE SULFATE (PF) 4 MG/ML IV SOLN
4.0000 mg | Freq: Once | INTRAVENOUS | Status: AC
  Administered 2021-04-11: 4 mg via INTRAVENOUS
  Filled 2021-04-11: qty 1

## 2021-04-11 MED ORDER — BUMETANIDE 1 MG PO TABS
2.0000 mg | ORAL_TABLET | Freq: Two times a day (BID) | ORAL | Status: DC
  Administered 2021-04-11 – 2021-04-12 (×2): 2 mg via ORAL
  Filled 2021-04-11 (×5): qty 2

## 2021-04-11 MED ORDER — CARVEDILOL 25 MG PO TABS
25.0000 mg | ORAL_TABLET | Freq: Two times a day (BID) | ORAL | Status: DC
  Administered 2021-04-11 – 2021-04-12 (×2): 25 mg via ORAL
  Filled 2021-04-11 (×2): qty 1

## 2021-04-11 MED ORDER — POTASSIUM CHLORIDE CRYS ER 20 MEQ PO TBCR
40.0000 meq | EXTENDED_RELEASE_TABLET | Freq: Once | ORAL | Status: AC
  Administered 2021-04-11: 40 meq via ORAL
  Filled 2021-04-11: qty 2

## 2021-04-11 MED ORDER — PANTOPRAZOLE SODIUM 40 MG PO TBEC
40.0000 mg | DELAYED_RELEASE_TABLET | Freq: Two times a day (BID) | ORAL | Status: DC
  Administered 2021-04-11 – 2021-04-12 (×3): 40 mg via ORAL
  Filled 2021-04-11 (×4): qty 1

## 2021-04-11 MED ORDER — ALBUTEROL SULFATE (2.5 MG/3ML) 0.083% IN NEBU: 3.0000 mL | INHALATION_SOLUTION | RESPIRATORY_TRACT | Status: DC | PRN

## 2021-04-11 MED ORDER — CLONAZEPAM 0.5 MG PO TABS
0.5000 mg | ORAL_TABLET | Freq: Every evening | ORAL | Status: DC | PRN
  Administered 2021-04-11: 0.5 mg via ORAL
  Filled 2021-04-11: qty 1

## 2021-04-11 MED ORDER — ENOXAPARIN SODIUM 60 MG/0.6ML IJ SOSY
0.5000 mg/kg | PREFILLED_SYRINGE | INTRAMUSCULAR | Status: DC
  Filled 2021-04-11: qty 0.6

## 2021-04-11 MED ORDER — EZETIMIBE 10 MG PO TABS
10.0000 mg | ORAL_TABLET | Freq: Every day | ORAL | Status: DC
  Administered 2021-04-11 – 2021-04-12 (×2): 10 mg via ORAL
  Filled 2021-04-11 (×3): qty 1

## 2021-04-11 MED ORDER — ASPIRIN 81 MG PO CHEW
324.0000 mg | CHEWABLE_TABLET | Freq: Once | ORAL | Status: AC
  Administered 2021-04-11: 324 mg via ORAL
  Filled 2021-04-11: qty 4

## 2021-04-11 MED ORDER — ONDANSETRON HCL 4 MG/2ML IJ SOLN: 4.0000 mg | Freq: Three times a day (TID) | INTRAMUSCULAR | Status: DC | PRN

## 2021-04-11 MED ORDER — INSULIN ASPART 100 UNIT/ML IJ SOLN
0.0000 [IU] | Freq: Every day | INTRAMUSCULAR | Status: DC
  Administered 2021-04-11: 2 [IU] via SUBCUTANEOUS
  Filled 2021-04-11: qty 1

## 2021-04-11 NOTE — Plan of Care (Signed)

## 2021-04-11 NOTE — ED Provider Notes (Signed)
[facility] Provider Note    Event Date/Time   First MD Initiated Contact with Patient 04/11/21 1242     (approximate)   History   Chest Pain   HPI  Kayla Caldwell is a 46 y.o. 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  Patient noticed pain in the right side of her jaw and ear 2 days ago.  Then while she was at work today around 6 AM she started having chest pain.  She describes it as a sharp pain under the left breast.  Comes on for minutes at a time.  It is associated with shortness of breath and nausea and she had 2 episodes of emesis.  Feels a little bit sick to her stomach.  Currently she does not have pain.  She denies any exertional symptoms or pleuritic nature of the pain.  She denies abdominal pain, cough or fevers.  Does have mild dyspnea.  Thinks her legs are somewhat swollen compliant with her diuretic.  Had a trigger finger release on 03/26/2021, wise no recent surgeries history of VTE or prolonged immobilization   Follows at [facility] cardiology.  Per last cardiology note last month, since her CABG she has had several episodes of atypical chest pain.  Past Medical History:  Diagnosis Date   COPD (chronic obstructive pulmonary disease) ([place])    Diabetes ([place])    Heart disease    Heart failure ([place])    Hyperlipidemia    Hypertension    MI (myocardial infarction) [facility])     Patient Active Problem List   Diagnosis Date Noted   Chest pain 04/11/2021   HLD (hyperlipidemia) 04/11/2021   Diabetes mellitus without complication ([place]) 38/18/2993   COPD (chronic obstructive pulmonary disease) ([place]) 04/11/2021   CAD (coronary artery disease) 04/11/2021   Hypokalemia 04/11/2021   Obesity, Class III, BMI 40-49.9 (morbid obesity) ([place]) 04/11/2021   Chronic diastolic CHF (congestive heart failure) ([place]) 04/11/2021   Class 3 severe obesity due to excess  calories without serious comorbidity with body mass index (BMI) of 40.0 to 44.9 in adult ([place]) 10/27/2020   Atelectasis, left    Lobar pneumonia ([place]) 10/11/2020   CAD (coronary artery disease), native coronary artery 10/11/2020   Hx of CABG 10/11/2020   Pneumonia 10/11/2020   Diabetes ([place])    Hypertension      Physical Exam  Triage Vital Signs: ED Triage Vitals  Enc Vitals Group     BP 04/11/21 1216 (!) 170/105     Pulse Rate 04/11/21 1216 72     Resp 04/11/21 1216 20     Temp 04/11/21 1216 99.2 F (37.3 C)     Temp Source 04/11/21 1216 Oral     SpO2 04/11/21 1216 99 %     Weight 04/11/21 1213 235 lb (106.6 kg)     Height 04/11/21 1213 5\' 4"  (1.626 m)     Head Circumference --      Peak Flow --      Pain Score 04/11/21 1213 8     Pain Loc --      Pain Edu? --      Excl. in [place]? --     Most recent vital signs: Vitals:   04/11/21 1445 04/11/21 1500  BP: (!) 151/102 (!) 172/113  Pulse: 80 81  Resp: 19 (!) 24  Temp:    SpO2: 99% 99%  General: Awake, no distress.  CV:  Good peripheral perfusion.  No lower extremity edema Resp:  Normal effort.  Lungs are clear Abd:  No distention.  Nontender throughout Neuro:             Awake, Alert, Oriented x 3  Other:     ED Results / Procedures / Treatments  Labs (all labs ordered are listed, but only abnormal results are displayed) Labs Reviewed  BASIC METABOLIC PANEL - Abnormal; Notable for the following components:      Result Value   Sodium 134 (*)    Potassium 3.3 (*)    Glucose, Bld 128 (*)    All other components within normal limits  D-DIMER, QUANTITATIVE - Abnormal; Notable for the following components:   D-Dimer, Quant 0.55 (*)    All other components within normal limits  RESP PANEL BY RT-PCR (FLU A&B, COVID) ARPGX2  CBC  MAGNESIUM  HEMOGLOBIN A1C  URINE DRUG SCREEN, QUALITATIVE ([facility] ONLY)  TROPONIN I (HIGH SENSITIVITY)  TROPONIN I (HIGH SENSITIVITY)  TROPONIN I (HIGH SENSITIVITY)      EKG  EKG interpreted by myself, normal sinus rhythm, normal axis, biphasic T wave in lead II, aVF inverted T wave in aVL and lead I   RADIOLOGY I reviewed the CXR which does not show any acute cardiopulmonary process; agree with radiology report     PROCEDURES:  Critical Care performed: No  .1-3 Lead EKG Interpretation Performed by: Rada Hay, MD Authorized by: Rada Hay, MD     Interpretation: normal     ECG rate assessment: normal     Ectopy: none     Conduction: normal   .Critical Care Performed by: Rada Hay, MD Authorized by: Rada Hay, MD   Critical care provider statement:    Critical care time (minutes):  30   Critical care was time spent personally by me on the following activities:  Development of treatment plan with patient or surrogate, discussions with consultants, evaluation of patient's response to treatment, examination of patient, ordering and review of laboratory studies, ordering and review of radiographic studies, ordering and performing treatments and interventions, pulse oximetry, re-evaluation of patient's condition and review of old charts  The patient is on the cardiac monitor to evaluate for evidence of arrhythmia and/or significant heart rate changes.   MEDICATIONS ORDERED IN ED: Medications  ondansetron (ZOFRAN) injection 4 mg (has no administration in time range)  acetaminophen (TYLENOL) tablet 650 mg (has no administration in time range)  hydrALAZINE (APRESOLINE) injection 5 mg (has no administration in time range)  HYDROmorphone (DILAUDID) injection 1 mg (has no administration in time range)  senna (SENOKOT) tablet 8.6 mg (has no administration in time range)  clopidogrel (PLAVIX) tablet 75 mg (has no administration in time range)  pantoprazole (PROTONIX) EC tablet 40 mg (has no administration in time range)  amLODipine (NORVASC) tablet 10 mg (has no administration in time range)  atorvastatin  (LIPITOR) tablet 80 mg (has no administration in time range)  bumetanide (BUMEX) tablet 2 mg (has no administration in time range)  carvedilol (COREG) tablet 25 mg (has no administration in time range)  ezetimibe (ZETIA) tablet 10 mg (has no administration in time range)  ranolazine (RANEXA) 12 hr tablet 1,000 mg (has no administration in time range)  hydrOXYzine (ATARAX) tablet 10 mg (has no administration in time range)  magnesium oxide (MAG-OX) tablet 400 mg (has no administration in time range)  insulin glargine-yfgn (SEMGLEE) injection 3 Units (has  no administration in time range)  insulin aspart (novoLOG) injection 0-9 Units (has no administration in time range)  insulin aspart (novoLOG) injection 0-5 Units (has no administration in time range)  albuterol (PROVENTIL) (2.5 MG/3ML) 0.083% nebulizer solution 3 mL (has no administration in time range)  dextromethorphan-guaiFENesin (MUCINEX DM) 30-600 MG per 12 hr tablet 1 tablet (has no administration in time range)  enoxaparin (LOVENOX) injection 52.5 mg (has no administration in time range)  aspirin EC tablet 81 mg (has no administration in time range)  potassium chloride SA (KLOR-CON M) CR tablet 40 mEq (40 mEq Oral Given 04/11/21 1456)  ondansetron (ZOFRAN-ODT) disintegrating tablet 4 mg (4 mg Oral Given 04/11/21 1319)  iohexol (OMNIPAQUE) 350 MG/ML injection 100 mL (100 mLs Intravenous Contrast Given 04/11/21 1404)  ondansetron (ZOFRAN) injection 4 mg (4 mg Intravenous Given 04/11/21 1430)  aspirin chewable tablet 324 mg (324 mg Oral Given 04/11/21 1500)  morphine (PF) 4 MG/ML injection 4 mg (4 mg Intravenous Given 04/11/21 1544)  labetalol (NORMODYNE) injection 10 mg (10 mg Intravenous Given 04/11/21 1542)     IMPRESSION / MDM / ASSESSMENT AND PLAN / ED COURSE  I reviewed the triage vital signs and the nursing notes.                              Differential diagnosis includes, but is not limited to, unstable angina, ACS, pulmonary  embolism, pneumonia, pleurisy, musculoskeletal  Patient is a 46 year old female who has a history of a CABG in 2022 presenting with chest pain.  Started around 6 AM today is atypical and that it is sharp nonexertional and intermittent.  Is associated with nausea and vomiting as well as dyspnea.  Patient started having jaw pain 2 days ago I do not feel that this is related.  She is hypertensive but other vital signs within normal limits.  I reviewed her EKG, she does have inverted T waves in 1 and aVL which were present on prior EKGs.  EKG today inferior leads have a biphasic T wave in lead II and aVF and a scooped appearance of the ST segment in lead III which does look different compared to her prior EKGs.  She is mildly hypokalemic with a potassium of 3.3 which could give this appearance on EKG.  Her initial troponin is negative.  We will send a D-dimer to rule out PE and will need a second troponin.  Patient 7 troponin is negative.  Her D-dimer is mildly elevated CT angio obtained which does not show any PE.  Patient still having ongoing pain and some nausea on repeat evaluation.  Blood pressure also elevated 172/113.  Given her risk factors will admit to the hospitalist service.  Discussed with hospitalist for admission.  We will give an aspirin and IV labetalol for blood pressure control given the could be a opponent of hypertensive emergency.  Patient apparently allergic to nitroglycerin so will not   FINAL CLINICAL IMPRESSION(S) / ED DIAGNOSES   Final diagnoses:  Chest pain, unspecified type     Rx / DC Orders   ED Discharge Orders     None        Note:  This document was prepared using Dragon voice recognition software and may include unintentional dictation errors.   Rada Hay, MD 04/11/21 [phone]

## 2021-04-11 NOTE — ED Notes (Signed)
To CT

## 2021-04-11 NOTE — ED Notes (Signed)
EDP at BS 

## 2021-04-11 NOTE — Progress Notes (Signed)
PHARMACIST - PHYSICIAN COMMUNICATION  CONCERNING:  Enoxaparin (Lovenox) for DVT Prophylaxis    RECOMMENDATION: Patient was prescribed enoxaprin 40mg  q24 hours for VTE prophylaxis.   Filed Weights   04/11/21 1213  Weight: 106.6 kg (235 lb)    Body mass index is 40.34 kg/m.  Estimated Creatinine Clearance: 86.3 mL/min (by C-G formula based on SCr of 0.96 mg/dL).   Based on [place] patient is candidate for enoxaparin 0.5mg /kg TBW SQ every 24 hours based on BMI being >30.   DESCRIPTION: Pharmacy has adjusted enoxaparin dose per [facility] policy.  Patient is now receiving enoxaparin 52.5 mg every 24 hours    Sherilyn Banker, PharmD Clinical Pharmacist  04/11/2021 3:28 PM

## 2021-04-11 NOTE — ED Notes (Signed)
2D echo US at [facility]

## 2021-04-11 NOTE — H&P (Addendum)
History and Physical    Kayla Caldwell WUJ:[id number] DOB: 1975-08-24 DOA: 04/11/2021  Referring MD/NP/PA:   PCP: Kayla Form, NP   Patient coming from:  The patient is coming from home.  At baseline, pt is independent for most of ADL.        Chief Complaint: chest pain  HPI: Kayla Caldwell is a 46 y.o. female with medical history significant of CAD, s/p of CABG 07/2020, HTN, HLD,  DM, dCHF, obesity with BMI 40.34, who present with chest pain.  Patient states that her chest pain started at about 6:00 this morning.  It is located left-sided chest, intermittently, sharp, 7 out of 10 in severity, radiating to the right jaw, not pleuritic, not aggravated by deep breath.  It is associated with shortness of breath. Patient has nausea and vomited twice with nonbilious nonbloody vomiting.  No abdominal pain or diarrhea.  No symptoms of UTI. Patient is allergic to nitroglycerin.  Patient stated she is taking Plavix and 81 mg aspirin daily  Data Reviewed and ED Course: pt was found to have troponin negative x2, WBC 6.7, pending COVID PCR, D-dimer 0.55, potassium 3.3, renal function okay, temperature 99.2, blood pressure 170/105, 146/100, heart rate 89, 39, 15, oxygen saturation 99% on room air.  Chest x-ray negative.  CT angiogram is negative for PE.  Patient is placed on PCU for observation, Dr. Saralyn Pilar of cardiology is consulted.  EKG: I have personally reviewed.  Sinus rhythm, QTc 432, old T wave inversion in the lateral leads, new ST depression in the inferior leads and V4-V6.   Review of Systems:   General: no fevers, chills, no body weight gain, has fatigue HEENT: no blurry vision, hearing changes or sore throat Respiratory: has dyspnea, no coughing, wheezing CV: has chest pain, no palpitations GI: has nausea, vomiting, no abdominal pain, diarrhea, constipation GU: no dysuria, burning on urination, increased urinary frequency, hematuria  Ext: no leg edema Neuro: no unilateral  weakness, numbness, or tingling, no vision change or hearing loss Skin: no rash, no skin tear. MSK: No muscle spasm, no deformity, no limitation of range of movement in spin Heme: No easy bruising.  Travel history: No recent long distant travel.   Allergy:  Allergies  Allergen Reactions   Compazine [Prochlorperazine]    Lisinopril    Naproxen    Nitroglycerin    Nsaids Hives    Reaction Type: Allergy; Severity: Mild   Toradol [Ketorolac Tromethamine]    Tramadol     Past Medical History:  Diagnosis Date   COPD (chronic obstructive pulmonary disease) (HCC)    Diabetes ([place])    Heart disease    Heart failure (HCC)    Hyperlipidemia    Hypertension    MI (myocardial infarction) ([place])     Past Surgical History:  Procedure Laterality Date   ABDOMINAL HYSTERECTOMY     CORONARY ARTERY BYPASS GRAFT      Social History:  reports that she has never smoked. She has never used smokeless tobacco. She reports that she does not drink alcohol and does not use drugs.  Family History:  Family History  Problem Relation Age of Onset   Hypertension Mother    Heart disease Mother    Depression Father    Hypertension Father    Heart disease Father    Breast cancer Maternal Aunt    Cancer Maternal Aunt      Prior to Admission medications   Medication Sig Start Date End Date Taking? Authorizing Provider  amLODipine (NORVASC) 10 MG tablet Take 1 tablet (10 mg total) by mouth daily. 10/27/20  Yes Masoud, Viann Shove, MD  atorvastatin (LIPITOR) 80 MG tablet Take 1 tablet (80 mg total) by mouth daily. 10/27/20  Yes Masoud, Viann Shove, MD  bumetanide (BUMEX) 2 MG tablet Take 2 mg by mouth 2 (two) times daily.   Yes [provider]  carvedilol (COREG) 25 MG tablet Take 1 tablet (25 mg total) by mouth 2 (two) times daily with a meal. 10/27/20  Yes Masoud, Viann Shove, MD  clopidogrel (PLAVIX) 75 MG tablet Take 75 mg by mouth daily.   Yes [provider]  ezetimibe (ZETIA) 10 MG tablet Take 1  tablet (10 mg total) by mouth daily. 10/27/20  Yes Masoud, Viann Shove, MD  insulin glargine (LANTUS) 100 UNIT/ML injection Inject 5 Units into the skin at bedtime.   Yes [provider]  insulin lispro (HUMALOG) 100 UNIT/ML injection Inject 2 Units into the skin 3 (three) times daily before meals.   Yes [provider]  magnesium oxide (MAG-OX) 400 MG tablet Take 400 mg by mouth daily.   Yes [provider]  ondansetron (ZOFRAN ODT) 4 MG disintegrating tablet Allow 1-2 tablets to dissolve in your mouth every 8 hours as needed for nausea/vomiting 11/18/20  Yes Hinda Kehr, MD  pantoprazole (PROTONIX) 40 MG tablet Take 1 tablet (40 mg total) by mouth 2 (two) times daily. 10/27/20  Yes Masoud, Viann Shove, MD  potassium chloride SA (KLOR-CON M) 20 MEQ tablet TAKE 1 TABLET BY MOUTH DAILY 03/12/21  Yes Masoud, Viann Shove, MD  ranolazine (RANEXA) 1000 MG SR tablet Take 1 tablet (1,000 mg total) by mouth 2 (two) times daily. 10/27/20  Yes Masoud, Viann Shove, MD  senna (SENOKOT) 8.6 MG TABS tablet Take 1 tablet (8.6 mg total) by mouth at bedtime. 10/27/20  Yes Masoud, Viann Shove, MD  albuterol (VENTOLIN HFA) 108 (90 Base) MCG/ACT inhaler Inhale 2 puffs into the lungs every 6 (six) hours as needed for wheezing or shortness of breath. 03/19/21   Vanessa [place], MD  Continuous Blood Gluc Sensor (FREESTYLE LIBRE 14 DAY SENSOR) MISC 1 each by Does not apply route every 14 (fourteen) days. 11/30/20   Cletis [place], MD  hydrOXYzine (ATARAX/VISTARIL) 10 MG tablet Take 1 tablet (10 mg total) by mouth every 8 (eight) hours as needed. Patient not taking: Reported on 04/11/2021 10/27/20   Cletis [place], MD    Physical Exam: Vitals:   04/11/21 1345 04/11/21 1435 04/11/21 1445 04/11/21 1500  BP: (!) 168/93 (!) 146/100 (!) 151/102 (!) 172/113  Pulse: 89 82 80 81  Resp: 18 15 19  (!) 24  Temp:      TempSrc:      SpO2: 100% 99% 99% 99%  Weight:      Height:       General: Not in acute distress HEENT:       Eyes: PERRL,  EOMI, no scleral icterus.       ENT: No discharge from the ears and nose, no pharynx injection, no tonsillar enlargement.        Neck: No JVD, no bruit, no mass felt. Heme: No neck lymph node enlargement. Cardiac: S1/S2, RRR, No gallops or rubs. Respiratory: No rales, wheezing, rhonchi or rubs. GI: Soft, nondistended, nontender, no rebound pain, no organomegaly, BS present. GU: No hematuria Ext: No pitting leg edema bilaterally. 1+DP/PT pulse bilaterally. Musculoskeletal: No joint deformities, No joint redness or warmth, no limitation of ROM in spin. Skin: No rashes.  Neuro: Alert, oriented  X3, cranial nerves II-XII grossly intact, moves all extremities normally.  Psych: Patient is not psychotic, no suicidal or hemocidal ideation.  Labs on Admission: I have personally reviewed following labs and imaging studies  CBC: Recent Labs  Lab 04/11/21 1217  WBC 6.7  HGB 12.6  HCT 37.5  MCV 87.2  PLT 947   Basic Metabolic Panel: Recent Labs  Lab 04/11/21 1217  NA 134*  K 3.3*  CL 101  CO2 24  GLUCOSE 128*  BUN 12  CREATININE 0.96  CALCIUM 9.0  MG 2.0   GFR: Estimated Creatinine Clearance: 86.3 mL/min (by C-G formula based on SCr of 0.96 mg/dL). Liver Function Tests: No results for input(s): AST, ALT, ALKPHOS, BILITOT, PROT, ALBUMIN in the last 168 hours. No results for input(s): LIPASE, AMYLASE in the last 168 hours. No results for input(s): AMMONIA in the last 168 hours. Coagulation Profile: No results for input(s): INR, PROTIME in the last 168 hours. Cardiac Enzymes: No results for input(s): CKTOTAL, CKMB, CKMBINDEX, TROPONINI in the last 168 hours. BNP (last 3 results) No results for input(s): PROBNP in the last 8760 hours. HbA1C: No results for input(s): HGBA1C in the last 72 hours. CBG: No results for input(s): GLUCAP in the last 168 hours. Lipid Profile: No results for input(s): CHOL, HDL, LDLCALC, TRIG, CHOLHDL, LDLDIRECT in the last 72 hours. Thyroid Function  Tests: No results for input(s): TSH, T4TOTAL, FREET4, T3FREE, THYROIDAB in the last 72 hours. Anemia Panel: No results for input(s): VITAMINB12, FOLATE, FERRITIN, TIBC, IRON, RETICCTPCT in the last 72 hours. Urine analysis: No results found for: COLORURINE, APPEARANCEUR, LABSPEC, PHURINE, GLUCOSEU, HGBUR, BILIRUBINUR, KETONESUR, PROTEINUR, UROBILINOGEN, NITRITE, LEUKOCYTESUR Sepsis Labs: @LABRCNTIP (procalcitonin:4,lacticidven:4) )No results found for this or any previous visit (from the past 240 hour(s)).   Radiological Exams on Admission: DG Chest 2 View  Result Date: 04/11/2021 CLINICAL DATA:  Left-sided chest pain radiating to the right jaw beginning this morning. EXAM: CHEST - 2 VIEW COMPARISON:  03/18/2021. FINDINGS: Stable changes from prior CABG surgery. Cardiac silhouette is normal in size and configuration. Normal mediastinal and hilar contours. Clear lungs.  No pleural effusion or pneumothorax. Skeletal structures are intact. IMPRESSION: No active cardiopulmonary disease. Electronically Signed   By: Lajean Manes M.D.   On: 04/11/2021 13:06   CT Angio Chest PE W and/or Wo Contrast  Result Date: 04/11/2021 CLINICAL DATA:  Left-sided chest pain radiating to the jaw beginning this morning. Also shortness of breath. EXAM: CT ANGIOGRAPHY CHEST WITH CONTRAST TECHNIQUE: Multidetector CT imaging of the chest was performed using the standard protocol during bolus administration of intravenous contrast. Multiplanar CT image reconstructions and MIPs were obtained to evaluate the vascular anatomy. RADIATION DOSE REDUCTION: This exam was performed according to the departmental dose-optimization program which includes automated exposure control, adjustment of the mA and/or kV according to patient size and/or use of iterative reconstruction technique. CONTRAST:  181mL OMNIPAQUE IOHEXOL 350 MG/ML SOLN COMPARISON:  11/18/2020.  Current chest radiograph. FINDINGS: Cardiovascular: Pulmonary arteries are well  opacified. There is no evidence of a pulmonary embolism. Heart normal in size and configuration. Previous CABG surgery. Coronary artery calcifications. No pericardial effusion. Great vessels are normal in caliber. No aortic dissection or atherosclerosis. Mediastinum/Nodes: Normal visualized thyroid. No neck base, mediastinal or hilar masses or enlarged lymph nodes. Trachea and esophagus are unremarkable. Lungs/Pleura: Lungs are clear. No pleural effusion or pneumothorax. Upper Abdomen: Unremarkable. Musculoskeletal: No fracture or acute finding. No bone lesion. No chest wall masses. Review of the MIP images confirms  the above findings. IMPRESSION: 1. No evidence of a pulmonary embolism. 2. No acute findings.  Clear lungs. Electronically Signed   By: Lajean Manes M.D.   On: 04/11/2021 14:42      Assessment/Plan Principal Problem:   Chest pain Active Problems:   Hypertension   HLD (hyperlipidemia)   Diabetes mellitus without complication (HCC)   COPD (chronic obstructive pulmonary disease) (HCC)   CAD (coronary artery disease)   Hypokalemia   Obesity, Class III, BMI 40-49.9 (morbid obesity) (HCC)   Chronic diastolic CHF (congestive heart failure) (HCC)   Chest pain and hx of CAD: s/p of CABG. Trop negative x 2 so far. Has new ST depression in the inferior leads and V4-V6.  D-dimer is positive, but CT angiogram is negative for PE. Consulted Dr. Saralyn Pilar of cardiology who recommended Lexi test.  - place to progressive unit for observation - Lexican Myoview is ordered - Trend Trop - pt is allergic to Nitroglycerin - prn dilaudid for pain - continue ASA, plavix and lipitor, Zetia, Ranexa - Risk factor stratification: will check FLP and A1C  - check UDS - LE doppler to r/o DVT  Hypertension: Blood pressure is elevated up to 170/105.  Patient was given 1 dose of IV labetalol 10 mg in ED. -IV hydralazine as needed -Amlodipine, Coreg,  HLD (hyperlipidemia) -Zetia, Lipitor  Diabetes  mellitus without complication [facility]): Recent A1c 6.9, well controlled.  Patient is taking Humalog and Lantus 5 unit daily -Glargine insulin 3 units daily -Sliding scale insulin  COPD (chronic obstructive pulmonary disease) ([place]): Stable -Bronchodilators  Hypokalemia: K 3.3 -Repleted with potassium -Check magnesium level  Obesity, Class III, BMI 40-49.9 (morbid obesity) ([place]): BMI 40.34 -Diet and exercise.   -Encouraged to lose weight.   Chronic diastolic CHF (congestive heart failure) ([place]): 2D echo on 12/15/2020 showed EF > 55%.  Patient does not have leg edema and JVD.  No pulm edema on chest x-ray.  CHF seems to be compensated. -Continue home Bumex 2 mg twice daily       DVT ppx: SQ Lovenox  Code Status: Full code  Family Communication:  Yes, patient's  daughter  by phone  Disposition Plan:  Anticipate discharge back to previous environment  Consults called: Dr. Saralyn Pilar for cardiology  Admission status and Level of care: Progressive:   for  obs     Severity of Illness:  The appropriate patient status for this patient is OBSERVATION. Observation status is judged to be reasonable and necessary in order to provide the required intensity of service to ensure the patient's safety. The patient's presenting symptoms, physical exam findings, and initial radiographic and laboratory data in the context of their medical condition is felt to place them at decreased risk for further clinical deterioration. Furthermore, it is anticipated that the patient will be medically stable for discharge from the hospital within 2 midnights of admission.        Date of Service 04/11/2021    Ivor Costa [facility]   If 7PM-7AM, please contact night-coverage [email or web address] 04/11/2021, 4:49 PM

## 2021-04-11 NOTE — ED Notes (Addendum)
Back to room from w/r via w/c, alert, NAD, calm, interactive, triage protocol test results labs, EKG, VS and CXR reviewed, EDP into room, at [facility].

## 2021-04-11 NOTE — ED Triage Notes (Signed)
Pt via POV from home. Pt c/o L sided chest pain that radiates to her R jaw that started around 0600 this AM. Pt also endorses SOB. Pt states in June she had open heart surgery to replace all 4 of her valves. Pt does take Plavix. Pt is A&Ox4 and NAD

## 2021-04-12 ENCOUNTER — Observation Stay: Payer: 59

## 2021-04-12 ENCOUNTER — Ambulatory Visit: Payer: 59

## 2021-04-12 ENCOUNTER — Encounter: Payer: Self-pay | Admitting: Internal Medicine

## 2021-04-12 DIAGNOSIS — R079 Chest pain, unspecified: Secondary | ICD-10-CM

## 2021-04-12 DIAGNOSIS — I10 Essential (primary) hypertension: Secondary | ICD-10-CM

## 2021-04-12 DIAGNOSIS — I2510 Atherosclerotic heart disease of native coronary artery without angina pectoris: Secondary | ICD-10-CM | POA: Diagnosis not present

## 2021-04-12 DIAGNOSIS — R0789 Other chest pain: Secondary | ICD-10-CM | POA: Diagnosis not present

## 2021-04-12 DIAGNOSIS — I5032 Chronic diastolic (congestive) heart failure: Secondary | ICD-10-CM | POA: Diagnosis not present

## 2021-04-12 DIAGNOSIS — J449 Chronic obstructive pulmonary disease, unspecified: Secondary | ICD-10-CM

## 2021-04-12 LAB — NM MYOCAR MULTI W/SPECT W/WALL MOTION / EF
LV dias vol: 71 mL (ref 46–106)
LV sys vol: 34 mL
Nuc Stress EF: 52 %
Rest Nuclear Isotope Dose: 10.2 mCi
SDS: 0
SRS: 1
SSS: 0
Stress Nuclear Isotope Dose: 28.8 mCi
TID: 1.02

## 2021-04-12 LAB — BASIC METABOLIC PANEL
Anion gap: 10 (ref 5–15)
BUN: 16 mg/dL (ref 6–20)
CO2: 24 mmol/L (ref 22–32)
Calcium: 8.9 mg/dL (ref 8.9–10.3)
Chloride: 104 mmol/L (ref 98–111)
Creatinine, Ser: 0.89 mg/dL (ref 0.44–1.00)
GFR, Estimated: >60 mL/min (ref >60)
Glucose, Bld: 143 mg/dL — ABNORMAL HIGH (ref 70–99)
Potassium: 3.6 mmol/L (ref 3.5–5.1)
Sodium: 138 mmol/L (ref 135–145)

## 2021-04-12 LAB — LIPID PANEL
Cholesterol: 110 mg/dL (ref 0–200)
HDL: 38 mg/dL — ABNORMAL LOW (ref >40)
LDL Cholesterol: 62 mg/dL (ref 0–99)
Total CHOL/HDL Ratio: 2.9 RATIO
Triglycerides: 48 mg/dL (ref <150)
VLDL: 10 mg/dL (ref 0–40)

## 2021-04-12 LAB — GLUCOSE, CAPILLARY
Glucose-Capillary: 137 mg/dL — ABNORMAL HIGH (ref 70–99)
Glucose-Capillary: 147 mg/dL — ABNORMAL HIGH (ref 70–99)

## 2021-04-12 MED ORDER — PROMETHAZINE HCL 12.5 MG PO TABS: 12.5000 mg | ORAL_TABLET | Freq: Two times a day (BID) | ORAL | 0 refills | Status: DC | PRN

## 2021-04-12 MED ORDER — TECHNETIUM TC 99M TETROFOSMIN IV KIT
10.0000 | PACK | Freq: Once | INTRAVENOUS | Status: AC | PRN
  Administered 2021-04-12: 10.16 via INTRAVENOUS

## 2021-04-12 MED ORDER — ASPIRIN 81 MG PO TBEC: 81.0000 mg | DELAYED_RELEASE_TABLET | Freq: Every day | ORAL | 11 refills | Status: AC

## 2021-04-12 MED ORDER — REGADENOSON 0.4 MG/5ML IV SOLN
0.4000 mg | Freq: Once | INTRAVENOUS | Status: AC
  Administered 2021-04-12: 0.4 mg via INTRAVENOUS

## 2021-04-12 MED ORDER — TECHNETIUM TC 99M TETROFOSMIN IV KIT
30.0000 | PACK | Freq: Once | INTRAVENOUS | Status: AC | PRN
  Administered 2021-04-12: 30 via INTRAVENOUS

## 2021-04-12 NOTE — Progress Notes (Signed)
°  Transition of Care [facility]) Screening Note   Patient Details  Name: Kayla Caldwell Date of Birth: [birth year]   Transition of Care [facility]) CM/SW Contact:    Alberteen Sam, LCSW Phone Number: 04/12/2021, 2:56 PM    Transition of Care Department [facility]) has reviewed patient and no TOC needs have been identified at this time. We will continue to monitor patient advancement through interdisciplinary progression rounds. If new patient transition needs arise, please place a TOC consult.  [place], [place]

## 2021-04-12 NOTE — Progress Notes (Signed)
Discharge paperwork reviewed with patient. Patient had no questions or concerns. Wheeled to the discharge lounge to await transport home with daughter.

## 2021-04-12 NOTE — Discharge Summary (Signed)
Physician Discharge Summary   Patient: Kayla Caldwell MRN: [id number] DOB: 07-Feb-1976  Admit date:     04/11/2021  Discharge date: 04/12/21  Discharge Physician: Fritzi Mandes   PCP: Peggye Form, NP   Recommendations at discharge:   follow-up with cardiology in 1 to 2 weeks Dr. Corky Sox  Discharge Diagnoses: Chest pain atypical with history of CAD status post CABG times 01 August 2020         Hospital Course:  Kayla Caldwell is a 46 y.o. female with medical history significant of CAD, s/p of CABG 07/2020, HTN, HLD,  DM, dCHF, obesity with BMI 40.34, who present with chest pain.   Chest pain with history of CAD status post CABG June 2020 -- troponins remained flat. -- CT chest negative for PE -- seen by Dr. Nehemiah Massed. She underwent nuclear myoview of stress test-- negative for ischemia. -- Continue home meds aspirin, Plavix, Lipitor, Zetia, Ranexa -- okay from cardiology standpoint for discharge  Hypertension -- on amlodipine and Coreg  type II diabetes without complication -- W0J 6.9 -- continue Lantus and sliding scale  Chronic diastolic CHF (congestive heart failure) ([place]): 2D echo on 12/15/2020 showed EF > 55%.  Patient does not have leg edema and JVD.  No pulm edema on chest x-ray.  CHF seems to be compensated. -Continue home Bumex 2 mg twice daily        Consultants: [facility] cardiology Dr. Nehemiah Massed Procedures performed: nuclear stress test Disposition: Home Diet recommendation:  Discharge Diet Orders (From admission, onward)     Start     Ordered   04/12/21 0000  Diet - low sodium heart healthy        04/12/21 1423   04/12/21 0000  Diet Carb Modified        04/12/21 1423           Cardiac and Carb modified diet  DISCHARGE MEDICATION: Allergies as of 04/12/2021       Reactions   Compazine [prochlorperazine]    Lisinopril    Naproxen    Nitroglycerin    Nsaids Hives   Reaction Type: Allergy; Severity: Mild   Toradol [ketorolac Tromethamine]    Tramadol          Medication List     STOP taking these medications    hydrOXYzine 10 MG tablet Commonly known as: ATARAX       TAKE these medications    albuterol 108 (90 Base) MCG/ACT inhaler Commonly known as: VENTOLIN HFA Inhale 2 puffs into the lungs every 6 (six) hours as needed for wheezing or shortness of breath.   amLODipine 10 MG tablet Commonly known as: NORVASC Take 1 tablet (10 mg total) by mouth daily.   aspirin 81 MG EC tablet Take 1 tablet (81 mg total) by mouth daily. Swallow whole. Start taking on: April 13, 2021   atorvastatin 80 MG tablet Commonly known as: LIPITOR Take 1 tablet (80 mg total) by mouth daily.   bumetanide 2 MG tablet Commonly known as: BUMEX Take 2 mg by mouth 2 (two) times daily.   carvedilol 25 MG tablet Commonly known as: COREG Take 1 tablet (25 mg total) by mouth 2 (two) times daily with a meal.   clopidogrel 75 MG tablet Commonly known as: PLAVIX Take 75 mg by mouth daily.   ezetimibe 10 MG tablet Commonly known as: ZETIA Take 1 tablet (10 mg total) by mouth daily.   FreeStyle Libre 14 Day Sensor Misc 1 each by Does not apply  route every 14 (fourteen) days.   insulin glargine 100 UNIT/ML injection Commonly known as: LANTUS Inject 5 Units into the skin at bedtime.   insulin lispro 100 UNIT/ML injection Commonly known as: HUMALOG Inject 2 Units into the skin 3 (three) times daily before meals.   magnesium oxide 400 MG tablet Commonly known as: MAG-OX Take 400 mg by mouth daily.   ondansetron 4 MG disintegrating tablet Commonly known as: Zofran ODT Allow 1-2 tablets to dissolve in your mouth every 8 hours as needed for nausea/vomiting   pantoprazole 40 MG tablet Commonly known as: PROTONIX Take 1 tablet (40 mg total) by mouth 2 (two) times daily.   potassium chloride SA 20 MEQ tablet Commonly known as: KLOR-CON M TAKE 1 TABLET BY MOUTH DAILY   promethazine 12.5 MG tablet Commonly known as: PHENERGAN Take 1  tablet (12.5 mg total) by mouth every 12 (twelve) hours as needed for nausea or vomiting.   ranolazine 1000 MG SR tablet Commonly known as: RANEXA Take 1 tablet (1,000 mg total) by mouth 2 (two) times daily.   senna 8.6 MG Tabs tablet Commonly known as: SENOKOT Take 1 tablet (8.6 mg total) by mouth at bedtime.        Follow-up Information     Andrez Grime, MD. Go in 1 week(s).   Specialty: Cardiology Why: follow up 1-2 weeks after discharge Contact information: [place] [address] [phone]         Peggye Form, NP. Schedule an appointment as soon as possible for a visit in 1 week(s).   Specialty: Family Medicine Why: chest pain f/u Contact information: [place] [address] [phone]                 Discharge Exam: Danley Danker Weights   04/11/21 1213  Weight: 106.6 kg   alert and oriented times three not acute distress. Both the heart sounds are normal breath rhythm regular respiratory good breath sounds bilaterally neurology rhonchi. Morbidly obese. Leg Tracy edema.  Condition at discharge: fair  The results of significant diagnostics from this hospitalization (including imaging, microbiology, ancillary and laboratory) are listed below for reference.   Imaging Studies: DG Chest 2 View  Result Date: 04/11/2021 CLINICAL DATA:  Left-sided chest pain radiating to the right jaw beginning this morning. EXAM: CHEST - 2 VIEW COMPARISON:  03/18/2021. FINDINGS: Stable changes from prior CABG surgery. Cardiac silhouette is normal in size and configuration. Normal mediastinal and hilar contours. Clear lungs.  No pleural effusion or pneumothorax. Skeletal structures are intact. IMPRESSION: No active cardiopulmonary disease. Electronically Signed   By: Lajean Manes M.D.   On: 04/11/2021 13:06   DG Chest 2 View  Result Date: 03/18/2021 CLINICAL DATA:  Left-sided chest pain for 2 hours, initial encounter EXAM:  CHEST - 2 VIEW COMPARISON:  02/27/2021 FINDINGS: Cardiac shadow is mildly enlarged. Postsurgical changes are again seen. The lungs are well aerated bilaterally. No focal infiltrate or sizable effusion is seen. No bony abnormality is noted. IMPRESSION: No acute abnormality noted. Electronically Signed   By: Inez Catalina M.D.   On: 03/18/2021 22:47   CT Angio Chest PE W and/or Wo Contrast  Result Date: 04/11/2021 CLINICAL DATA:  Left-sided chest pain radiating to the jaw beginning this morning. Also shortness of breath. EXAM: CT ANGIOGRAPHY CHEST WITH CONTRAST TECHNIQUE: Multidetector CT imaging of the chest was performed using the standard protocol during bolus administration of intravenous contrast. Multiplanar CT image reconstructions and  MIPs were obtained to evaluate the vascular anatomy. RADIATION DOSE REDUCTION: This exam was performed according to the departmental dose-optimization program which includes automated exposure control, adjustment of the mA and/or kV according to patient size and/or use of iterative reconstruction technique. CONTRAST:  159mL OMNIPAQUE IOHEXOL 350 MG/ML SOLN COMPARISON:  11/18/2020.  Current chest radiograph. FINDINGS: Cardiovascular: Pulmonary arteries are well opacified. There is no evidence of a pulmonary embolism. Heart normal in size and configuration. Previous CABG surgery. Coronary artery calcifications. No pericardial effusion. Great vessels are normal in caliber. No aortic dissection or atherosclerosis. Mediastinum/Nodes: Normal visualized thyroid. No neck base, mediastinal or hilar masses or enlarged lymph nodes. Trachea and esophagus are unremarkable. Lungs/Pleura: Lungs are clear. No pleural effusion or pneumothorax. Upper Abdomen: Unremarkable. Musculoskeletal: No fracture or acute finding. No bone lesion. No chest wall masses. Review of the MIP images confirms the above findings. IMPRESSION: 1. No evidence of a pulmonary embolism. 2. No acute findings.  Clear  lungs. Electronically Signed   By: Lajean Manes M.D.   On: 04/11/2021 14:42   CT ABDOMEN PELVIS W CONTRAST  Result Date: 03/24/2021 CLINICAL DATA:  Nausea and vomiting, diarrhea EXAM: CT ABDOMEN AND PELVIS WITH CONTRAST TECHNIQUE: Multidetector CT imaging of the abdomen and pelvis was performed using the standard protocol following bolus administration of intravenous contrast. RADIATION DOSE REDUCTION: This exam was performed according to the departmental dose-optimization program which includes automated exposure control, adjustment of the mA and/or kV according to patient size and/or use of iterative reconstruction technique. CONTRAST:  162mL OMNIPAQUE IOHEXOL 300 MG/ML  SOLN COMPARISON:  CT abdomen and pelvis 10/11/2020 FINDINGS: Lower chest: No acute abnormality. Hepatobiliary: Liver is mildly enlarged with normal contour. Small faint hypodensity adjacent to the falciform ligament likely represents focal fatty infiltration. No suspicious hepatic mass identified. Gallbladder appears normal. No biliary ductal dilatation. Pancreas: Unremarkable. No pancreatic ductal dilatation or surrounding inflammatory changes. Spleen: Normal in size without focal abnormality. Adrenals/Urinary Tract: Adrenal glands are unremarkable. Kidneys are normal, without renal calculi, focal lesion, or hydronephrosis. Bladder is unremarkable. Stomach/Bowel: No bowel obstruction, free air or pneumatosis. No bowel wall edema identified. Appendix is normal. Vascular/Lymphatic: Aortic atherosclerosis. No enlarged abdominal or pelvic lymph nodes. Reproductive: Status post hysterectomy. Left adnexal hypodense likely ovarian cyst measuring 3.4 cm. 1.4 cm well-defined hypodensity in the left lower vaginal region which is mildly enlarged since previous study, likely a Bartholin's gland cyst. Other: No ascites. Musculoskeletal: No acute or significant osseous findings. IMPRESSION: 1. No acute process identified in the abdomen or pelvis. 2. Mild  hepatomegaly. 3. Small likely Bartholin's gland cyst on the left. Electronically Signed   By: Ofilia Neas M.D.   On: 03/24/2021 15:23   NM Myocar Multi W/Spect W/Wall Motion / EF  Result Date: 04/12/2021   The study is normal. The study is low risk.   LV perfusion is normal.   Left ventricular function is normal. End diastolic cavity size is normal.   Prior study not available for comparison.   US Venous Img Lower Bilateral (DVT)  Result Date: 04/11/2021 CLINICAL DATA:  Positive D-dimer. Left-sided chest pain radiating to the jaw. Shortness of breath. EXAM: BILATERAL LOWER EXTREMITY VENOUS DOPPLER ULTRASOUND TECHNIQUE: Gray-scale sonography with compression, as well as color and duplex ultrasound, were performed to evaluate the deep venous system(s) from the level of the common femoral vein through the popliteal and proximal calf veins. COMPARISON:  None. FINDINGS: VENOUS Normal compressibility of the common femoral, superficial femoral, and popliteal veins, as  well as the visualized calf veins. Visualized portions of profunda femoral vein and great saphenous vein unremarkable. No filling defects to suggest DVT on grayscale or color Doppler imaging. Doppler waveforms show normal direction of venous flow, normal respiratory plasticity and response to augmentation. Limited views of the contralateral common femoral vein are unremarkable. OTHER None. Limitations: none IMPRESSION: Negative. Electronically Signed   By: Lajean Manes M.D.   On: 04/11/2021 17:12    Microbiology: Results for orders placed or performed during the hospital encounter of 03/19/21  Resp Panel by RT-PCR (Flu A&B, Covid) Nasopharyngeal Swab     Status: None   Collection Time: 03/19/21  3:34 AM   Specimen: Nasopharyngeal Swab; Nasopharyngeal(NP) swabs in vial transport medium  Result Value Ref Range Status   SARS Coronavirus 2 by RT PCR NEGATIVE NEGATIVE Final    Comment: (NOTE) SARS-CoV-2 target nucleic acids are NOT  DETECTED.  The SARS-CoV-2 RNA is generally detectable in upper respiratory specimens during the acute phase of infection. The lowest concentration of SARS-CoV-2 viral copies this assay can detect is 138 copies/mL. A negative result does not preclude SARS-Cov-2 infection and should not be used as the sole basis for treatment or other patient management decisions. A negative result may occur with  improper specimen collection/handling, submission of specimen other than nasopharyngeal swab, presence of viral mutation(s) within the areas targeted by this assay, and inadequate number of viral copies(<138 copies/mL). A negative result must be combined with clinical observations, patient history, and epidemiological information. The expected result is Negative.  Fact Sheet for Patients:  EntrepreneurPulse.com.au  Fact Sheet for Healthcare Providers:  IncredibleEmployment.be  This test is no t yet approved or cleared by the [place] FDA and  has been authorized for detection and/or diagnosis of SARS-CoV-2 by FDA under an Emergency Use Authorization (EUA). This EUA will remain  in effect (meaning this test can be used) for the duration of the COVID-19 declaration under Section 564(b)(1) of the Act, 21 U.S.C.section 360bbb-3(b)(1), unless the authorization is terminated  or revoked sooner.       Influenza A by PCR NEGATIVE NEGATIVE Final   Influenza B by PCR NEGATIVE NEGATIVE Final    Comment: (NOTE) The Xpert Xpress SARS-CoV-2/FLU/RSV plus assay is intended as an aid in the diagnosis of influenza from Nasopharyngeal swab specimens and should not be used as a sole basis for treatment. Nasal washings and aspirates are unacceptable for Xpert Xpress SARS-CoV-2/FLU/RSV testing.  Fact Sheet for Patients: EntrepreneurPulse.com.au  Fact Sheet for Healthcare Providers: IncredibleEmployment.be  This test is not yet  approved or cleared by the [place] FDA and has been authorized for detection and/or diagnosis of SARS-CoV-2 by FDA under an Emergency Use Authorization (EUA). This EUA will remain in effect (meaning this test can be used) for the duration of the COVID-19 declaration under Section 564(b)(1) of the Act, 21 U.S.C. section 360bbb-3(b)(1), unless the authorization is terminated or revoked.  Performed at [facility] [place], [place] [address]     Labs: CBC: Recent Labs  Lab 04/11/21 1217  WBC 6.7  HGB 12.6  HCT 37.5  MCV 87.2  PLT 423   Basic Metabolic Panel: Recent Labs  Lab 04/11/21 1217 04/12/21 0445  NA 134* 138  K 3.3* 3.6  CL 101 104  CO2 24 24  GLUCOSE 128* 143*  BUN 12 16  CREATININE 0.96 0.89  CALCIUM 9.0 8.9  MG 2.0  --    Liver Function Tests: No results  for input(s): AST, ALT, ALKPHOS, BILITOT, PROT, ALBUMIN in the last 168 hours. CBG: Recent Labs  Lab 04/11/21 1748 04/11/21 2104 04/12/21 0802 04/12/21 1216  GLUCAP 113* 249* 137* 147*    Discharge time spent: greater than 30 minutes.  Signed: Fritzi Mandes, MD [facility] 04/12/2021

## 2021-04-12 NOTE — Consult Note (Addendum)
[facility] NOTE       Patient ID: Kayla Caldwell MRN: [id number] DOB/AGE: Sep 09, 1975 46 y.o.  Admit date: 04/11/2021 Referring Physician Dr. Ivor Costa Primary Physician Dr. Grayland Ormond Primary Cardiologist Dr. Donnelly Angelica  Reason for Consultation chest pain   HPI: The patient is a 46 year old female with a past medical history significant for CAD s/p CABG x4 on 07/2020 after NSTEMI, hypertension, HFpEF (LVEF greater than 55%, G1 DD 11/2020), type 2 diabetes, history of CVA, obesity who presented to [facility] ED 04/11/2021 with chest pain.  Cardiology is consulted for further evaluation.  Per her last office visit note with Dr. Corky Sox, the patient has presented to the ED twice last fall with chest discomfort which was found to be musculoskeletal in origin. 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 She admitted to chest pain under the left breast that started at 0600 and lasted for minutes at a time and was intermittent throughout the morning until she left work at 12. She vomited twice while at work. She had some jaw pain two days ago, but it went away with tylenol. She had some chest discomfort and nausea this morning, but it went away with dilaudid. She denies any radiation of the chest pain, associated diaphoresis, SOB, palpitations, or lower extremity edema.   Vitals on admission are notable for elevated blood pressure to 190/129 which is down to 132/81 this morning after IV labetalol. She said she didn't take her blood pressure medications yesterday morning because she took them all very late at night after she got home from work   Labs on admission are significant for potassium 3.3-3.6.  Troponin flat 14-16-18-15.  Total cholesterol 110, HDL 38, LDL 62, triglycerides 48.  H&H 12.6, 37.5.  Hemoglobin A1c 6.9.  Chest x-ray is negative for active cardiopulmonary disease.  Bilateral lower extremity Doppler  ultrasound is negative for DVT.   Review of systems complete and found to be negative unless listed above     Past Medical History:  Diagnosis Date   COPD (chronic obstructive pulmonary disease) ([place])    Diabetes ([place])    Heart disease    Heart failure ([place])    Hyperlipidemia    Hypertension    MI (myocardial infarction) ([place])     Past Surgical History:  Procedure Laterality Date   ABDOMINAL HYSTERECTOMY     CORONARY ARTERY BYPASS GRAFT      Medications Prior to Admission  Medication Sig Dispense Refill Last Dose   amLODipine (NORVASC) 10 MG tablet Take 1 tablet (10 mg total) by mouth daily. 90 tablet 3 04/10/2021 at 2000   atorvastatin (LIPITOR) 80 MG tablet Take 1 tablet (80 mg total) by mouth daily. 90 tablet 3 04/10/2021   bumetanide (BUMEX) 2 MG tablet Take 2 mg by mouth 2 (two) times daily.   04/10/2021   carvedilol (COREG) 25 MG tablet Take 1 tablet (25 mg total) by mouth 2 (two) times daily with a meal. 180 tablet 3 04/10/2021   clopidogrel (PLAVIX) 75 MG tablet Take 75 mg by mouth daily.   04/10/2021   ezetimibe (ZETIA) 10 MG tablet Take 1 tablet (10 mg total) by mouth daily. 90 tablet 3 04/10/2021   insulin glargine (LANTUS) 100 UNIT/ML injection Inject 5 Units into the skin at bedtime.   04/10/2021   insulin lispro (HUMALOG) 100 UNIT/ML injection Inject 2 Units into the skin 3 (three) times daily  before meals.   04/10/2021   magnesium oxide (MAG-OX) 400 MG tablet Take 400 mg by mouth daily.   04/10/2021   ondansetron (ZOFRAN ODT) 4 MG disintegrating tablet Allow 1-2 tablets to dissolve in your mouth every 8 hours as needed for nausea/vomiting 30 tablet 0 04/10/2021   pantoprazole (PROTONIX) 40 MG tablet Take 1 tablet (40 mg total) by mouth 2 (two) times daily. 180 tablet 3 04/10/2021   potassium chloride SA (KLOR-CON M) 20 MEQ tablet TAKE 1 TABLET BY MOUTH DAILY 30 tablet 3 04/10/2021   ranolazine (RANEXA) 1000 MG SR tablet Take 1 tablet (1,000 mg total) by mouth 2 (two) times  daily. 180 tablet 3 04/10/2021   senna (SENOKOT) 8.6 MG TABS tablet Take 1 tablet (8.6 mg total) by mouth at bedtime. 90 tablet 3 04/10/2021   albuterol (VENTOLIN HFA) 108 (90 Base) MCG/ACT inhaler Inhale 2 puffs into the lungs every 6 (six) hours as needed for wheezing or shortness of breath. 8 g 2 prn at prn   Continuous Blood Gluc Sensor (FREESTYLE LIBRE 14 DAY SENSOR) MISC 1 each by Does not apply route every 14 (fourteen) days. 1 each 6    hydrOXYzine (ATARAX/VISTARIL) 10 MG tablet Take 1 tablet (10 mg total) by mouth every 8 (eight) hours as needed. (Patient not taking: Reported on 04/11/2021) 90 tablet 3 Not Taking   Social History   Socioeconomic History   Marital status: Single    Spouse name: Not on file   Number of children: Not on file   Years of education: Not on file   Highest education level: Not on file  Occupational History   Not on file  Tobacco Use   Smoking status: Never   Smokeless tobacco: Never  Vaping Use   Vaping Use: Never used  Substance and Sexual Activity   Alcohol use: Never   Drug use: Never   Sexual activity: Not Currently  Other Topics Concern   Not on file  Social History Narrative   Not on file   Social Determinants of Health   Financial Resource Strain: Not on file  Food Insecurity: Not on file  Transportation Needs: Not on file  Physical Activity: Not on file  Stress: Not on file  Social Connections: Not on file  Intimate Partner Violence: Not on file    Family History  Problem Relation Age of Onset   Hypertension Mother    Heart disease Mother    Depression Father    Hypertension Father    Heart disease Father    Breast cancer Maternal Aunt    Cancer Maternal Aunt       Review of systems complete and found to be negative unless listed above    PHYSICAL EXAM General: Pleasant black female , well nourished, in no acute distress. Laying on her left side at an incline in PCU bed.  HEENT:  Normocephalic and atraumatic. Neck:  No  JVD.  Lungs: Normal respiratory effort on room air. Clear bilaterally to auscultation. No wheezes, crackles, rhonchi.  Heart: HRRR . Normal S1 and S2 without gallops or murmurs. Radial & DP pulses 1+ bilaterally. Median sternotomy scar present.  Abdomen: soft, nontender, obese.  Msk: Normal strength and tone for age. Extremities: Warm and well perfused. No clubbing, cyanosis. No lower extremity edema.  Neuro: Alert and oriented X 3. Psych:  Answers questions appropriately.   Labs:   Lab Results  Component Value Date   WBC 6.7 04/11/2021   HGB 12.6 04/11/2021  HCT 37.5 04/11/2021   MCV 87.2 04/11/2021   PLT 340 04/11/2021    Recent Labs  Lab 04/12/21 0445  NA 138  K 3.6  CL 104  CO2 24  BUN 16  CREATININE 0.89  CALCIUM 8.9  GLUCOSE 143*   No results found for: CKTOTAL, CKMB, CKMBINDEX, TROPONINI  Lab Results  Component Value Date   CHOL 110 04/12/2021   Lab Results  Component Value Date   HDL 38 (L) 04/12/2021   Lab Results  Component Value Date   LDLCALC 62 04/12/2021   Lab Results  Component Value Date   TRIG 48 04/12/2021   Lab Results  Component Value Date   CHOLHDL 2.9 04/12/2021   No results found for: LDLDIRECT    Radiology: DG Chest 2 View  Result Date: 04/11/2021 CLINICAL DATA:  Left-sided chest pain radiating to the right jaw beginning this morning. EXAM: CHEST - 2 VIEW COMPARISON:  03/18/2021. FINDINGS: Stable changes from prior CABG surgery. Cardiac silhouette is normal in size and configuration. Normal mediastinal and hilar contours. Clear lungs.  No pleural effusion or pneumothorax. Skeletal structures are intact. IMPRESSION: No active cardiopulmonary disease. Electronically Signed   By: Lajean Manes M.D.   On: 04/11/2021 13:06   DG Chest 2 View  Result Date: 03/18/2021 CLINICAL DATA:  Left-sided chest pain for 2 hours, initial encounter EXAM: CHEST - 2 VIEW COMPARISON:  02/27/2021 FINDINGS: Cardiac shadow is mildly enlarged. Postsurgical  changes are again seen. The lungs are well aerated bilaterally. No focal infiltrate or sizable effusion is seen. No bony abnormality is noted. IMPRESSION: No acute abnormality noted. Electronically Signed   By: Inez Catalina M.D.   On: 03/18/2021 22:47   CT Angio Chest PE W and/or Wo Contrast  Result Date: 04/11/2021 CLINICAL DATA:  Left-sided chest pain radiating to the jaw beginning this morning. Also shortness of breath. EXAM: CT ANGIOGRAPHY CHEST WITH CONTRAST TECHNIQUE: Multidetector CT imaging of the chest was performed using the standard protocol during bolus administration of intravenous contrast. Multiplanar CT image reconstructions and MIPs were obtained to evaluate the vascular anatomy. RADIATION DOSE REDUCTION: This exam was performed according to the departmental dose-optimization program which includes automated exposure control, adjustment of the mA and/or kV according to patient size and/or use of iterative reconstruction technique. CONTRAST:  154mL OMNIPAQUE IOHEXOL 350 MG/ML SOLN COMPARISON:  11/18/2020.  Current chest radiograph. FINDINGS: Cardiovascular: Pulmonary arteries are well opacified. There is no evidence of a pulmonary embolism. Heart normal in size and configuration. Previous CABG surgery. Coronary artery calcifications. No pericardial effusion. Great vessels are normal in caliber. No aortic dissection or atherosclerosis. Mediastinum/Nodes: Normal visualized thyroid. No neck base, mediastinal or hilar masses or enlarged lymph nodes. Trachea and esophagus are unremarkable. Lungs/Pleura: Lungs are clear. No pleural effusion or pneumothorax. Upper Abdomen: Unremarkable. Musculoskeletal: No fracture or acute finding. No bone lesion. No chest wall masses. Review of the MIP images confirms the above findings. IMPRESSION: 1. No evidence of a pulmonary embolism. 2. No acute findings.  Clear lungs. Electronically Signed   By: Lajean Manes M.D.   On: 04/11/2021 14:42   CT ABDOMEN PELVIS W  CONTRAST  Result Date: 03/24/2021 CLINICAL DATA:  Nausea and vomiting, diarrhea EXAM: CT ABDOMEN AND PELVIS WITH CONTRAST TECHNIQUE: Multidetector CT imaging of the abdomen and pelvis was performed using the standard protocol following bolus administration of intravenous contrast. RADIATION DOSE REDUCTION: This exam was performed according to the departmental dose-optimization program which includes automated exposure control, adjustment  of the mA and/or kV according to patient size and/or use of iterative reconstruction technique. CONTRAST:  135mL OMNIPAQUE IOHEXOL 300 MG/ML  SOLN COMPARISON:  CT abdomen and pelvis 10/11/2020 FINDINGS: Lower chest: No acute abnormality. Hepatobiliary: Liver is mildly enlarged with normal contour. Small faint hypodensity adjacent to the falciform ligament likely represents focal fatty infiltration. No suspicious hepatic mass identified. Gallbladder appears normal. No biliary ductal dilatation. Pancreas: Unremarkable. No pancreatic ductal dilatation or surrounding inflammatory changes. Spleen: Normal in size without focal abnormality. Adrenals/Urinary Tract: Adrenal glands are unremarkable. Kidneys are normal, without renal calculi, focal lesion, or hydronephrosis. Bladder is unremarkable. Stomach/Bowel: No bowel obstruction, free air or pneumatosis. No bowel wall edema identified. Appendix is normal. Vascular/Lymphatic: Aortic atherosclerosis. No enlarged abdominal or pelvic lymph nodes. Reproductive: Status post hysterectomy. Left adnexal hypodense likely ovarian cyst measuring 3.4 cm. 1.4 cm well-defined hypodensity in the left lower vaginal region which is mildly enlarged since previous study, likely a Bartholin's gland cyst. Other: No ascites. Musculoskeletal: No acute or significant osseous findings. IMPRESSION: 1. No acute process identified in the abdomen or pelvis. 2. Mild hepatomegaly. 3. Small likely Bartholin's gland cyst on the left. Electronically Signed   By: Ofilia Neas M.D.   On: 03/24/2021 15:23   US Venous Img Lower Bilateral (DVT)  Result Date: 04/11/2021 CLINICAL DATA:  Positive D-dimer. Left-sided chest pain radiating to the jaw. Shortness of breath. EXAM: BILATERAL LOWER EXTREMITY VENOUS DOPPLER ULTRASOUND TECHNIQUE: Gray-scale sonography with compression, as well as color and duplex ultrasound, were performed to evaluate the deep venous system(s) from the level of the common femoral vein through the popliteal and proximal calf veins. COMPARISON:  None. FINDINGS: VENOUS Normal compressibility of the common femoral, superficial femoral, and popliteal veins, as well as the visualized calf veins. Visualized portions of profunda femoral vein and great saphenous vein unremarkable. No filling defects to suggest DVT on grayscale or color Doppler imaging. Doppler waveforms show normal direction of venous flow, normal respiratory plasticity and response to augmentation. Limited views of the contralateral common femoral vein are unremarkable. OTHER None. Limitations: none IMPRESSION: Negative. Electronically Signed   By: Lajean Manes M.D.   On: 04/11/2021 17:12    ECHO 12/15/2020 NORMAL LEFT VENTRICULAR SYSTOLIC FUNCTION, LVEF greater than 55% NORMAL RIGHT VENTRICULAR SYSTOLIC FUNCTION, G1 diastolic dysfunction TRIVIAL AR, MR, TR, PR NO VALVULAR STENOSIS   TELEMETRY reviewed by me: NSR rate 74 with occasional PVCs  EKG reviewed by me: Normal sinus rhythm rate 74 with inferolateral T wave flattening which is a change from 03/19/2021 were only lateral T wave flattening was present.  ASSESSMENT AND PLAN:  The patient is a 46 year old female with a past medical history significant for CAD s/p CABG x4 on 07/2020 after NSTEMI, hypertension, HFpEF (LVEF greater than 55%, G1 DD 11/2020), type 2 diabetes, history of CVA, obesity who presented to [facility] ED 04/11/2021 with chest pain.  Cardiology is consulted for further evaluation.  #Atypical chest pain #CAD s/p CABG  x4 07/2020 The patient presented with nausea and non exertional, left sided chest pain that lasted for 2 minutes at a time, occurring intermittently throughout yesterday morning while she was at work. She notably did not take her blood pressure medications yesterday morning and her BP was significantly elevated on admission but came down with IV labetalol. Today she reports some chest discomfort and nausea that terminated with dilaudid and phenergan. Troponins flat.  As the patient has known CAD with CABG less than 1 year ago and  her EKG shows some further T wave flattening in both the inferior and lateral leads, we will proceed with further cardiac workup with a stress test and make further recommendations based on its result.  -s/p 324mg  aspirin. Continue 81mg  aspirin daily -PRN meds for pain and nausea. She reports allergy to nitro. -monitor on telemetry while inpatient -repeat EKG with chest pain  -plan for lexiscan myoview scheduled around 12:30pm today   Stress test today showed no evidence of significant myocardial ischemia and also showed normal LV systolic function with ejection fraction of 65%.\ Therefore, patient safe for ambulation and okay for discharge to home with follow-up evaluation  #hypertension #HFpEF -continue home meds coreg 25mg  BID, amlodipine 10mg  QD, ranexa 1000mg  BID, bumex 2mg   -continue atorvastatin 80mg  and zetia 10mg .   This patient's plan of care was discussed and created with Dr. Serafina Royals and he is in agreement.  Signed: Tristan Schroeder , PA-C 04/12/2021, 7:43 AM [facility]  The patient has been interviewed and examined. I agree with assessment and plan above. Serafina Royals MD [facility]

## 2021-04-12 NOTE — Progress Notes (Signed)
Nutrition Brief Note  Patient identified on the Malnutrition Screening Tool (MST) Report  Wt Readings from Last 15 Encounters:  04/11/21 106.6 kg  03/18/21 108.9 kg  02/27/21 105.2 kg  11/10/20 104.8 kg  10/27/20 105.2 kg  10/11/20 113 kg   Sitlaly Gudiel is a 46 y.o. female with medical history significant of CAD, s/p of CABG 07/2020, HTN, HLD,  DM, dCHF, obesity with BMI 40.34, who present with chest pain.  Pt admitted with chest pain.   Reviewed I/O's: +370 ml x 24 hours  Reviewed wt hx; wt has been stable over the past 5 months.    Medications reviewed and include senna.   Pt currently NPO for stress test. Pt unavailable at time of visit; down for stress test. RD unable to obtain further nutrition-related history or complete nutrition-focused physical exam at this time.    Lab Results  Component Value Date   HGBA1C 6.9 (H) 04/11/2021    PTA DM medications are 5 units insulin glargine daily and 2 units insulin lispro TID.   Labs reviewed: CBGS: 811-914 (inpatient orders for glycemic control are 0-5 units insulin aspart daily at bedtime, 0-9 units insulin aspart TID with meals, and 3 units insulin glargine-yfgn daily at bedtime).    Body mass index is 40.34 kg/m. Patient meets criteria for morbid obesity, class III based on current BMI. Obesity is a complex, chronic medical condition that is optimally managed by a multidisciplinary care team. Weight loss is not an ideal goal for an acute inpatient hospitalization. However, if further work-up for obesity is warranted, consider outpatient referral to outpatient bariatric service and/or [place]'s Nutrition and Diabetes Education Services.    Current diet order is NPO, patient is consuming approximately n/a% of meals at this time. Labs and medications reviewed.   No nutrition interventions warranted at this time. If nutrition issues arise, please consult RD.   Loistine Chance, RD, LDN, [place] Registered Dietitian II Certified  Diabetes Care and Education Specialist Please refer to [facility] for RD and/or RD on-call/weekend/after hours pager

## 2021-05-21 ENCOUNTER — Emergency Department
Admission: EM | Admit: 2021-05-21 | Discharge: 2021-05-21 | Disposition: A | Discharge status: Home / Self Care | Payer: 59 | Attending: Student in an Organized Health Care Education/Training Program | Admitting: Student in an Organized Health Care Education/Training Program

## 2021-05-21 ENCOUNTER — Other Ambulatory Visit: Payer: Self-pay

## 2021-05-21 ENCOUNTER — Encounter: Payer: Self-pay | Admitting: Emergency Medicine

## 2021-05-21 DIAGNOSIS — M545 Low back pain, unspecified: Secondary | ICD-10-CM | POA: Diagnosis present

## 2021-05-21 DIAGNOSIS — M5441 Lumbago with sciatica, right side: Secondary | ICD-10-CM | POA: Diagnosis not present

## 2021-05-21 LAB — URINALYSIS, COMPLETE (UACMP) WITH MICROSCOPIC
Bilirubin Urine: NEGATIVE
Glucose, UA: NEGATIVE mg/dL
Hgb urine dipstick: NEGATIVE
Ketones, ur: NEGATIVE mg/dL
Leukocytes,Ua: NEGATIVE
Nitrite: NEGATIVE
Protein, ur: NEGATIVE mg/dL
Specific Gravity, Urine: 1.016 (ref 1.005–1.030)
pH: 7 (ref 5.0–8.0)

## 2021-05-21 LAB — PREGNANCY, URINE: Preg Test, Ur: NEGATIVE

## 2021-05-21 MED ORDER — HYDROCODONE-ACETAMINOPHEN 5-325 MG PO TABS
1.0000 | ORAL_TABLET | Freq: Once | ORAL | Status: AC
  Administered 2021-05-21: 1 via ORAL
  Filled 2021-05-21: qty 1

## 2021-05-21 MED ORDER — ONDANSETRON 4 MG PO TBDP
4.0000 mg | ORAL_TABLET | Freq: Once | ORAL | Status: AC
  Administered 2021-05-21: 4 mg via ORAL
  Filled 2021-05-21: qty 1

## 2021-05-21 MED ORDER — METHOCARBAMOL 500 MG PO TABS
500.0000 mg | ORAL_TABLET | Freq: Three times a day (TID) | ORAL | 0 refills | Status: AC | PRN
Start: 2021-05-21 — End: 2021-05-26

## 2021-05-21 MED ORDER — PREDNISONE 10 MG (21) PO TBPK: ORAL_TABLET | ORAL | 0 refills | Status: DC

## 2021-05-21 NOTE — ED Triage Notes (Signed)
C/O right lower back pain and right shoulder pain x 1 week.  Pain radiating down right leg to toes. ? ?AAOx3.  Skin warm and dry. NAD ?

## 2021-05-21 NOTE — ED Provider Notes (Signed)
? ?[facility] ?Provider Note ? ?Patient Contact: 6:21 PM (approximate) ? ? ?History  ? ?Back Pain ? ? ?HPI ? ?Kayla Caldwell is a 46 y.o. female presents to the emergency department with right-sided low back pain that radiates along the lateral aspect of her lower extremity.  Patient states that she has to stand for prolonged periods of time at work.  She denies bowel or bladder incontinence or saddle anesthesia.  Denies dysuria or increased urinary frequency.  Patient ? ?  ? ? ?Physical Exam  ? ?Triage Vital Signs: ?ED Triage Vitals  ?Enc Vitals Group  ?   BP 05/21/21 1646 (!) 159/66  ?   Pulse Rate 05/21/21 1646 86  ?   Resp 05/21/21 1646 18  ?   Temp 05/21/21 1646 98.8 ?F (37.1 ?C)  ?   Temp Source 05/21/21 1646 Oral  ?   SpO2 05/21/21 1646 100 %  ?   Weight 05/21/21 1633 235 lb 0.2 oz (106.6 kg)  ?   Height 05/21/21 1633 '5\' 4"'$  (1.626 m)  ?   Head Circumference --   ?   Peak Flow --   ?   Pain Score 05/21/21 1633 8  ?   Pain Loc --   ?   Pain Edu? --   ?   Excl. in [place]? --   ? ? ?Most recent vital signs: ?Vitals:  ? 05/21/21 1646 05/21/21 1900  ?BP: (!) 159/66 (!) 155/100  ?Pulse: 86 64  ?Resp: 18 18  ?Temp: 98.8 ?F (37.1 ?C)   ?SpO2: 100% 100%  ? ? ? ?General: Alert and in no acute distress. ?Eyes:  PERRL. EOMI. ?Head: No acute traumatic findings ?ENT: ?     Ears:  ?     Nose: No congestion/rhinnorhea. ?     Mouth/Throat: Mucous membranes are moist. ?Neck: No stridor. No cervical spine tenderness to palpation. ?Cardiovascular:  Good peripheral perfusion ?Respiratory: Normal respiratory effort without tachypnea or retractions. Lungs CTAB. Good air entry to the bases with no decreased or absent breath sounds. ?Gastrointestinal: Bowel sounds ?4 quadrants. Soft and nontender to palpation. No guarding or rigidity. No palpable masses. No distention. No CVA tenderness. ?Musculoskeletal: Full range of motion to all extremities.  Patient has symmetric strength in the lower extremities.  Positive  straight leg raise on the right. ?Neurologic:  No gross focal neurologic deficits are appreciated.  ?Skin:   No rash noted ?Other: ? ? ?ED Results / Procedures / Treatments  ? ?Labs ?(all labs ordered are listed, but only abnormal results are displayed) ?Labs Reviewed  ?URINALYSIS, COMPLETE (UACMP) WITH MICROSCOPIC - Abnormal; Notable for the following components:  ?    Result Value  ? Color, Urine YELLOW (*)   ? APPearance HAZY (*)   ? Bacteria, UA RARE (*)   ? All other components within normal limits  ?PREGNANCY, URINE  ?POC URINE PREG, ED  ? ? ? ? ? ? ?PROCEDURES: ? ?Critical Care performed: No ? ?Procedures ? ? ?MEDICATIONS ORDERED IN ED: ?Medications  ?HYDROcodone-acetaminophen (NORCO/VICODIN) 5-325 MG per tablet 1 tablet (1 tablet Oral Given 05/21/21 1857)  ?ondansetron (ZOFRAN-ODT) disintegrating tablet 4 mg (4 mg Oral Given 05/21/21 1854)  ? ? ? ?IMPRESSION / MDM / ASSESSMENT AND PLAN / ED COURSE  ?I reviewed the triage vital signs and the nursing notes. ?             ?               ? ?  Assessment and plan: ?Lumbar radiculopathy:  ?Differential diagnosis includes, but is not limited to, low back pain ? ?46 year old female with a history of L5-S1 disc herniation presents to the emergency department with right lateral hip and thigh pain that radiates to the foot.  Patient did have 4 out of 5 strength with right great toe resisted extension and 5 out of 5 strength on the left.  Suspect lumbar radiculopathy as a source of patient's discomfort.  Urinalysis showed no signs of UTI and urine pregnancy test was negative.  Patient states that she has been advised not to take anti-inflammatories at home.  We will start patient on a brief course of tapered prednisone as well as Robaxin to be taken at night.  Return precautions were given to return with new or worsening symptoms. ?  ? ? ?FINAL CLINICAL IMPRESSION(S) / ED DIAGNOSES  ? ?Final diagnoses:  ?Acute bilateral low back pain with right-sided sciatica  ? ? ? ?Rx /  DC Orders  ? ?ED Discharge Orders   ? ?      Ordered  ?  predniSONE (STERAPRED UNI-PAK 21 TAB) 10 MG (21) TBPK tablet       ? 05/21/21 1817  ?  methocarbamol (ROBAXIN) 500 MG tablet  Every 8 hours PRN       ? 05/21/21 1817  ? ?  ?  ? ?  ? ? ? ?Note:  This document was prepared using Dragon voice recognition software and may include unintentional dictation errors. ?  ?Lannie Fields, PA-C ?05/21/21 1927 ? ?  ?Carrie Mew, MD ?05/21/21 1939 ? ?

## 2021-05-21 NOTE — Discharge Instructions (Signed)
Take tapered steroid and Robaxin as directed. ?

## 2021-05-21 NOTE — ED Notes (Signed)
Pt up to bathroom to obtain urine sample, gait steady, NAD. ?

## 2021-06-11 ENCOUNTER — Emergency Department: Payer: 59

## 2021-06-11 ENCOUNTER — Observation Stay
Admission: EM | Admit: 2021-06-11 | Discharge: 2021-06-12 | Disposition: A | Discharge status: Home / Self Care | Payer: 59 | Attending: Hospitalist | Admitting: Hospitalist

## 2021-06-11 DIAGNOSIS — M25512 Pain in left shoulder: Secondary | ICD-10-CM

## 2021-06-11 DIAGNOSIS — Z794 Long term (current) use of insulin: Secondary | ICD-10-CM | POA: Diagnosis not present

## 2021-06-11 DIAGNOSIS — I110 Hypertensive heart disease with heart failure: Secondary | ICD-10-CM | POA: Insufficient documentation

## 2021-06-11 DIAGNOSIS — Z7982 Long term (current) use of aspirin: Secondary | ICD-10-CM | POA: Insufficient documentation

## 2021-06-11 DIAGNOSIS — I160 Hypertensive urgency: Secondary | ICD-10-CM | POA: Diagnosis not present

## 2021-06-11 DIAGNOSIS — Z951 Presence of aortocoronary bypass graft: Secondary | ICD-10-CM

## 2021-06-11 DIAGNOSIS — R079 Chest pain, unspecified: Secondary | ICD-10-CM | POA: Diagnosis present

## 2021-06-11 DIAGNOSIS — E119 Type 2 diabetes mellitus without complications: Secondary | ICD-10-CM | POA: Diagnosis not present

## 2021-06-11 DIAGNOSIS — J449 Chronic obstructive pulmonary disease, unspecified: Secondary | ICD-10-CM | POA: Diagnosis not present

## 2021-06-11 DIAGNOSIS — Z7902 Long term (current) use of antithrombotics/antiplatelets: Secondary | ICD-10-CM | POA: Insufficient documentation

## 2021-06-11 DIAGNOSIS — M75102 Unspecified rotator cuff tear or rupture of left shoulder, not specified as traumatic: Secondary | ICD-10-CM

## 2021-06-11 DIAGNOSIS — N179 Acute kidney failure, unspecified: Secondary | ICD-10-CM | POA: Diagnosis not present

## 2021-06-11 DIAGNOSIS — I200 Unstable angina: Secondary | ICD-10-CM | POA: Diagnosis not present

## 2021-06-11 DIAGNOSIS — I5032 Chronic diastolic (congestive) heart failure: Secondary | ICD-10-CM | POA: Diagnosis present

## 2021-06-11 DIAGNOSIS — E876 Hypokalemia: Secondary | ICD-10-CM | POA: Diagnosis not present

## 2021-06-11 DIAGNOSIS — Z79899 Other long term (current) drug therapy: Secondary | ICD-10-CM | POA: Insufficient documentation

## 2021-06-11 DIAGNOSIS — I25110 Atherosclerotic heart disease of native coronary artery with unstable angina pectoris: Principal | ICD-10-CM | POA: Insufficient documentation

## 2021-06-11 DIAGNOSIS — Z8673 Personal history of transient ischemic attack (TIA), and cerebral infarction without residual deficits: Secondary | ICD-10-CM | POA: Diagnosis not present

## 2021-06-11 DIAGNOSIS — R0789 Other chest pain: Secondary | ICD-10-CM | POA: Diagnosis present

## 2021-06-11 LAB — CBC
HCT: 35.9 % — ABNORMAL LOW (ref 36.0–46.0)
Hemoglobin: 12.1 g/dL (ref 12.0–15.0)
MCH: 28.9 pg (ref 26.0–34.0)
MCHC: 33.7 g/dL (ref 30.0–36.0)
MCV: 85.9 fL (ref 80.0–100.0)
Platelets: 268 10*3/uL (ref 150–400)
RBC: 4.18 MIL/uL (ref 3.87–5.11)
RDW: 13.3 % (ref 11.5–15.5)
WBC: 7 10*3/uL (ref 4.0–10.5)
nRBC: 0 % (ref 0.0–0.2)

## 2021-06-11 LAB — BASIC METABOLIC PANEL
Anion gap: 7 (ref 5–15)
BUN: 23 mg/dL — ABNORMAL HIGH (ref 6–20)
CO2: 24 mmol/L (ref 22–32)
Calcium: 9.1 mg/dL (ref 8.9–10.3)
Chloride: 105 mmol/L (ref 98–111)
Creatinine, Ser: 1.07 mg/dL — ABNORMAL HIGH (ref 0.44–1.00)
GFR, Estimated: >60 mL/min (ref >60)
Glucose, Bld: 141 mg/dL — ABNORMAL HIGH (ref 70–99)
Potassium: 3.4 mmol/L — ABNORMAL LOW (ref 3.5–5.1)
Sodium: 136 mmol/L (ref 135–145)

## 2021-06-11 LAB — HEPATIC FUNCTION PANEL
ALT: 14 U/L (ref 0–44)
AST: 17 U/L (ref 15–41)
Albumin: 3.8 g/dL (ref 3.5–5.0)
Alkaline Phosphatase: 86 U/L (ref 38–126)
Bilirubin, Direct: <0.1 mg/dL (ref 0.0–0.2)
Total Bilirubin: 0.4 mg/dL (ref 0.3–1.2)
Total Protein: 7.7 g/dL (ref 6.5–8.1)

## 2021-06-11 LAB — TROPONIN I (HIGH SENSITIVITY)
Troponin I (High Sensitivity): 16 ng/L (ref <18)
Troponin I (High Sensitivity): 17 ng/L (ref <18)

## 2021-06-11 LAB — LIPASE, BLOOD: Lipase: 41 U/L (ref 11–51)

## 2021-06-11 LAB — D-DIMER, QUANTITATIVE: D-Dimer, Quant: 0.37 ug/mL-FEU (ref 0.00–0.50)

## 2021-06-11 LAB — MAGNESIUM: Magnesium: 2.2 mg/dL (ref 1.7–2.4)

## 2021-06-11 MED ORDER — FENTANYL CITRATE PF 50 MCG/ML IJ SOSY
50.0000 ug | PREFILLED_SYRINGE | Freq: Once | INTRAMUSCULAR | Status: AC
  Administered 2021-06-11: 50 ug via INTRAVENOUS
  Filled 2021-06-11: qty 1

## 2021-06-11 MED ORDER — HEPARIN (PORCINE) 25000 UT/250ML-% IV SOLN
1200.0000 [IU]/h | INTRAVENOUS | Status: DC
  Administered 2021-06-12: 1200 [IU]/h via INTRAVENOUS
  Filled 2021-06-11: qty 250

## 2021-06-11 MED ORDER — ASPIRIN 81 MG PO CHEW
324.0000 mg | CHEWABLE_TABLET | ORAL | Status: AC
  Administered 2021-06-12: 324 mg via ORAL
  Filled 2021-06-11: qty 4

## 2021-06-11 MED ORDER — AMLODIPINE BESYLATE 5 MG PO TABS
10.0000 mg | ORAL_TABLET | Freq: Every day | ORAL | Status: DC
  Administered 2021-06-11 – 2021-06-12 (×2): 10 mg via ORAL
  Filled 2021-06-11 (×2): qty 2

## 2021-06-11 MED ORDER — ONDANSETRON HCL 4 MG/2ML IJ SOLN
4.0000 mg | Freq: Once | INTRAMUSCULAR | Status: AC
  Administered 2021-06-11: 4 mg via INTRAVENOUS
  Filled 2021-06-11: qty 2

## 2021-06-11 MED ORDER — ASPIRIN EC 81 MG PO TBEC
81.0000 mg | DELAYED_RELEASE_TABLET | Freq: Every day | ORAL | Status: DC
  Administered 2021-06-12: 81 mg via ORAL
  Filled 2021-06-11: qty 1

## 2021-06-11 MED ORDER — SODIUM CHLORIDE 0.9 % IV BOLUS
500.0000 mL | Freq: Once | INTRAVENOUS | Status: AC
Start: 2021-06-11 — End: 2021-06-12
  Administered 2021-06-11: 500 mL via INTRAVENOUS

## 2021-06-11 MED ORDER — BUMETANIDE 1 MG PO TABS
2.0000 mg | ORAL_TABLET | Freq: Two times a day (BID) | ORAL | Status: DC
  Administered 2021-06-12: 2 mg via ORAL
  Filled 2021-06-11 (×2): qty 2

## 2021-06-11 MED ORDER — ATORVASTATIN CALCIUM 20 MG PO TABS
80.0000 mg | ORAL_TABLET | Freq: Every day | ORAL | Status: DC
  Administered 2021-06-12: 80 mg via ORAL
  Filled 2021-06-11: qty 4

## 2021-06-11 MED ORDER — HEPARIN BOLUS VIA INFUSION
4000.0000 [IU] | Freq: Once | INTRAVENOUS | Status: AC
  Administered 2021-06-12: 4000 [IU] via INTRAVENOUS
  Filled 2021-06-11: qty 4000

## 2021-06-11 MED ORDER — CARVEDILOL 6.25 MG PO TABS
25.0000 mg | ORAL_TABLET | Freq: Two times a day (BID) | ORAL | Status: DC
  Administered 2021-06-11 – 2021-06-12 (×2): 25 mg via ORAL
  Filled 2021-06-11: qty 1
  Filled 2021-06-11: qty 4

## 2021-06-11 MED ORDER — POTASSIUM CHLORIDE CRYS ER 20 MEQ PO TBCR: 20.0000 meq | EXTENDED_RELEASE_TABLET | Freq: Every day | ORAL | Status: DC

## 2021-06-11 MED ORDER — ACETAMINOPHEN 325 MG PO TABS
650.0000 mg | ORAL_TABLET | ORAL | Status: DC | PRN
  Administered 2021-06-12 (×2): 650 mg via ORAL
  Filled 2021-06-11 (×2): qty 2

## 2021-06-11 MED ORDER — INSULIN ASPART 100 UNIT/ML IJ SOLN
0.0000 [IU] | INTRAMUSCULAR | Status: DC
  Administered 2021-06-12: 4 [IU] via SUBCUTANEOUS
  Administered 2021-06-12: 3 [IU] via SUBCUTANEOUS
  Filled 2021-06-11 (×2): qty 1

## 2021-06-11 MED ORDER — PANTOPRAZOLE SODIUM 40 MG PO TBEC
40.0000 mg | DELAYED_RELEASE_TABLET | Freq: Two times a day (BID) | ORAL | Status: DC
  Administered 2021-06-12 (×2): 40 mg via ORAL
  Filled 2021-06-11 (×2): qty 1

## 2021-06-11 MED ORDER — ALBUTEROL SULFATE (2.5 MG/3ML) 0.083% IN NEBU: 3.0000 mL | INHALATION_SOLUTION | Freq: Four times a day (QID) | RESPIRATORY_TRACT | Status: DC | PRN

## 2021-06-11 MED ORDER — ASPIRIN 300 MG RE SUPP: 300.0000 mg | RECTAL | Status: AC

## 2021-06-11 MED ORDER — ONDANSETRON HCL 4 MG/2ML IJ SOLN: 4.0000 mg | Freq: Four times a day (QID) | INTRAMUSCULAR | Status: DC | PRN

## 2021-06-11 MED ORDER — PANTOPRAZOLE SODIUM 40 MG PO TBEC
40.0000 mg | DELAYED_RELEASE_TABLET | Freq: Once | ORAL | Status: AC
  Administered 2021-06-11: 40 mg via ORAL
  Filled 2021-06-11: qty 1

## 2021-06-11 MED ORDER — ALUM & MAG HYDROXIDE-SIMETH 200-200-20 MG/5ML PO SUSP
30.0000 mL | Freq: Once | ORAL | Status: AC
  Administered 2021-06-11: 30 mL via ORAL
  Filled 2021-06-11: qty 30

## 2021-06-11 MED ORDER — POTASSIUM CHLORIDE CRYS ER 20 MEQ PO TBCR
40.0000 meq | EXTENDED_RELEASE_TABLET | Freq: Once | ORAL | Status: AC
  Administered 2021-06-11: 40 meq via ORAL
  Filled 2021-06-11: qty 2

## 2021-06-11 MED ORDER — SUCRALFATE 1 G PO TABS
1.0000 g | ORAL_TABLET | Freq: Once | ORAL | Status: AC
  Administered 2021-06-11: 1 g via ORAL
  Filled 2021-06-11: qty 1

## 2021-06-11 MED ORDER — ASPIRIN 81 MG PO TBEC: 81.0000 mg | DELAYED_RELEASE_TABLET | Freq: Every day | ORAL | Status: DC

## 2021-06-11 NOTE — Assessment & Plan Note (Signed)
Creatinine 1.07 up from baseline of 0.73 ?Gentle one-time IV bolus and monitor ?

## 2021-06-11 NOTE — Assessment & Plan Note (Signed)
IV metoprolol for BP control ?Continue home amlodipine ?

## 2021-06-11 NOTE — ED Provider Notes (Signed)
? ?[facility] ?Provider Note ? ? ? Event Date/Time  ? First MD Initiated Contact with Patient 06/11/21 1844   ?  (approximate) ? ? ?History  ? ?Chest Pain (Cp since 12 today , arm left side pain , cardiac history cabg history ) ? ? ?HPI ? ?Kayla Caldwell is a 46 y.o. female  with medical history significant of CAD, s/p of CABG 07/2020, HTN, HLD,  DM, dCHF, and obesity presents for evaluation of chest pain.  Patient stated it started around noon today.  She was at work but was not doing anything particularly stressful including any heavy lifting or twisting.  She describes it as an achy mass that is intermittently sharp.  It radiates to her left arm.  Also radiates slightly to her jaw.  No back pain, shortness of breath, cough, fevers, abdominal pain or vomiting but she has been feeling nauseous.  No diarrhea, urinary symptoms rash or extremity pain.  No recent falls or injuries.  She denies tobacco abuse, EtOH use or illicit drug use.  She states she is compliant with all her medications.  He states that each of her heart attack in the past is a little different and she is not sure if this was similar to her last heart attack or not. ? ?  ? ? ?Physical Exam  ?Triage Vital Signs: ?ED Triage Vitals  ?Enc Vitals Group  ?   BP   ?   Pulse   ?   Resp   ?   Temp   ?   Temp src   ?   SpO2   ?   Weight   ?   Height   ?   Head Circumference   ?   Peak Flow   ?   Pain Score   ?   Pain Loc   ?   Pain Edu?   ?   Excl. in [place]?   ? ? ?Most recent vital signs: ?Vitals:  ? 06/11/21 1849 06/11/21 2136  ?BP: (!) 164/81 (!) 152/122  ?Pulse: 66 62  ?Resp: 17 (!) 24  ?Temp: 98.5 ?F (36.9 ?C)   ?SpO2: 98% 100%  ? ? ?General: Awake, no distress.  ?CV:  Good peripheral perfusion.  2+ radial pulse. ?Resp:  Normal effort.  Clear bilaterally. ?Abd:  No distention.  Soft ?Other:  No significant lower extremity edema. ? ? ?ED Results / Procedures / Treatments  ?Labs ?(all labs ordered are listed, but only abnormal results  are displayed) ?Labs Reviewed  ?BASIC METABOLIC PANEL - Abnormal; Notable for the following components:  ?    Result Value  ? Potassium 3.4 (*)   ? Glucose, Bld 141 (*)   ? BUN 23 (*)   ? Creatinine, Ser 1.07 (*)   ? All other components within normal limits  ?CBC - Abnormal; Notable for the following components:  ? HCT 35.9 (*)   ? All other components within normal limits  ?D-DIMER, QUANTITATIVE  ?MAGNESIUM  ?LIPASE, BLOOD  ?HEPATIC FUNCTION PANEL  ?APTT  ?PROTIME-INR  ?TROPONIN I (HIGH SENSITIVITY)  ?TROPONIN I (HIGH SENSITIVITY)  ? ? ? ?EKG ? ?EKG is remarkable for sinus rhythm with ventricular rate of 63, normal axis, T wave inversion in lead I and aVL without other clear evidence of acute ischemia or significant arrhythmia. ? ?Today's ECG is compared to that from 2/14 showed T wave inversions in lead I and aVL although perhaps slightly more pronounced today. ? ? ?RADIOLOGY ?Chest  reviewed by myself shows no focal consoidation, effusion, edema, pneumothorax or other clear acute thoracic process. I also reviewed radiology interpretation and agree with findings described. ? ? ? ?PROCEDURES: ? ?Critical Care performed: No ? ?.1-3 Lead EKG Interpretation ?Performed by: Lucrezia Starch, MD ?Authorized by: Lucrezia Starch, MD  ? ?  Interpretation: normal   ?  ECG rate assessment: normal   ?  Rhythm: sinus rhythm   ?  Ectopy: none   ?  Conduction: normal   ? ?The patient is on the cardiac monitor to evaluate for evidence of arrhythmia and/or significant heart rate changes. ? ? ?MEDICATIONS ORDERED IN ED: ?Medications  ?amLODipine (NORVASC) tablet 10 mg (10 mg Oral Given 06/11/21 2135)  ?carvedilol (COREG) tablet 25 mg (25 mg Oral Given 06/11/21 2135)  ?aspirin EC tablet 81 mg (has no administration in time range)  ?atorvastatin (LIPITOR) tablet 80 mg (has no administration in time range)  ?bumetanide (BUMEX) tablet 2 mg (has no administration in time range)  ?pantoprazole (PROTONIX) EC tablet 40 mg (has no  administration in time range)  ?albuterol (VENTOLIN HFA) 108 (90 Base) MCG/ACT inhaler 2 puff (has no administration in time range)  ?potassium chloride SA (KLOR-CON M) CR tablet 20 mEq (has no administration in time range)  ?aspirin chewable tablet 324 mg (has no administration in time range)  ?  Or  ?aspirin suppository 300 mg (has no administration in time range)  ?aspirin EC tablet 81 mg (has no administration in time range)  ?acetaminophen (TYLENOL) tablet 650 mg (has no administration in time range)  ?ondansetron (ZOFRAN) injection 4 mg (has no administration in time range)  ?insulin aspart (novoLOG) injection 0-20 Units (has no administration in time range)  ?ondansetron (ZOFRAN) injection 4 mg (has no administration in time range)  ?heparin bolus via infusion 4,000 Units (has no administration in time range)  ?heparin ADULT infusion 100 units/mL (25000 units/249m) (has no administration in time range)  ?ondansetron ([facility] injection 4 mg (4 mg Intravenous Given 06/11/21 1935)  ?fentaNYL (SUBLIMAZE) injection 50 mcg (50 mcg Intravenous Given 06/11/21 1936)  ?potassium chloride SA (KLOR-CON M) CR tablet 40 mEq (40 mEq Oral Given 06/11/21 1935)  ?alum & mag hydroxide-simeth (MAALOX/MYLANTA) 200-200-20 MG/5ML suspension 30 mL (30 mLs Oral Given 06/11/21 2135)  ?sucralfate (CARAFATE) tablet 1 g (1 g Oral Given 06/11/21 2135)  ?pantoprazole (PROTONIX) EC tablet 40 mg (40 mg Oral Given 06/11/21 2135)  ?ondansetron ([facility] injection 4 mg (4 mg Intravenous Given 06/11/21 2240)  ?fentaNYL (SUBLIMAZE) injection 50 mcg (50 mcg Intravenous Given 06/11/21 2240)  ?sodium chloride 0.9 % bolus 500 mL (500 mLs Intravenous New Bag/Given 06/11/21 2324)  ? ? ? ?IMPRESSION / MDM / ASSESSMENT AND PLAN / ED COURSE  ?I reviewed the triage vital signs and the nursing notes. ?             ?               ? ?Differential diagnosis includes, but is not limited to pneumothorax, ACS, pneumonia, PE,, GERD, costochondritis, arrhythmia, anemia and  metabolic derangements. ? ?EKG and nonelevated troponin x2 are not suggestive of an NSTEMI although patient's history and ongoing pain on several reassessments as well as slightly increased T wave inversion in isolated lead are concerning for possible unstable angina. ? ?Chest x-ray shows no effusion, no thorax edema or other clear acute thoracic process to explain patient's pain. ? ?CBC without leukocytosis or acute anemia.  BMP shows a K of  3.4 without any other significant electrolyte or metabolic derangements.  Low suspicion for PE as D-dimer is 0.37.  Magnesium is within normal limits.  Lipase and hepatic function panel are unremarkable not suggestive of pancreatitis or acute cholestatic process. ? ?Discussed my concern for possible unstable angina with on-call cardiologist Dr. Corky Sox who agreed to be reasonable to observe patient on medicine service overnight given ongoing pain in her history.  She is allergic to nitroglycerin.  I discussed with admitting hospitalist will place admission orders ? ?  ? ? ?FINAL CLINICAL IMPRESSION(S) / ED DIAGNOSES  ? ?Final diagnoses:  ?Chest pain, unspecified type  ? ? ? ?Rx / DC Orders  ? ?ED Discharge Orders   ? ? None  ? ?  ? ? ? ?Note:  This document was prepared using Dragon voice recognition software and may include unintentional dictation errors. ?  ?Lucrezia Starch, MD ?06/12/21 0003 ? ?

## 2021-06-11 NOTE — Assessment & Plan Note (Signed)
Possible unstable angina but could be related to hypertensive urgency ?We will start heparin ?Continue morphine for pain.  Patient allergic to nitroglycerin ?Continue antiplatelet, statins and beta-blockers ?Cardiology consult ?We will keep n.p.o. tonight in case of procedure in the a.m. ? ?

## 2021-06-11 NOTE — ED Triage Notes (Signed)
Cp since 12 today , left arm pain , asa 324 given by ems, pt allergic to nitro  ?

## 2021-06-11 NOTE — Assessment & Plan Note (Signed)
Sliding scale insulin coverage 

## 2021-06-11 NOTE — Assessment & Plan Note (Signed)
Continue aspirin and statins ?

## 2021-06-11 NOTE — Assessment & Plan Note (Signed)
Management as above °

## 2021-06-11 NOTE — Assessment & Plan Note (Addendum)
Continue carvedilol, amlodipine ?

## 2021-06-11 NOTE — Assessment & Plan Note (Signed)
Continue Bumex and carvedilol ?

## 2021-06-11 NOTE — Assessment & Plan Note (Signed)
DuoNebs as needed 

## 2021-06-11 NOTE — H&P (Signed)
?History and Physical  ? ? ?Patient: Kayla Caldwell QTM:[id number] DOB: 1975-12-16 ?DOA: 06/11/2021 ?DOS: the patient was seen and examined on 06/11/2021 ?PCP: Peggye Form, NP  ?Patient coming from: Home ? ?Chief Complaint:  ?Chief Complaint  ?Patient presents with  ? Chest Pain  ?  Cp since 12 today , arm left side pain , cardiac history cabg history   ? ? ?HPI: Kayla Caldwell is a 46 y.o. female with medical history significant for CAD s/p CABG June 2022, HTN, DM, diastolic CHF who presents to the ED for evaluation of chest pain that started around noon on the day of arrival.  Pain feels like tightness and radiates to the left arm.  She has no associated nausea, vomiting, diaphoresis, palpitations or lightheadedness. ?ED course and data review: Tachypneic to 24 on arrival with BP 152/122 and otherwise normal vitals.  First troponin 17.  CBC, BMP, D-dimer, mag, lipase and hepatic function panel reviewed and significant abnormalities include creatinine of 1.07 which is up from her baseline of 0.73 but otherwise unremarkable.  EKG, personally viewed and interpreted shows sinus rhythm with mild T wave inversion in aVL otherwise unremarkable.  Chest x-ray with no acute disease.  Patient was initially treated with GI cocktail without pain relief.  Subsequently given fentanyl with some pain relief.  Hospitalist consulted for admission.  ? ?Review of Systems: As mentioned in the history of present illness. All other systems reviewed and are negative. ?Past Medical History:  ?Diagnosis Date  ? COPD (chronic obstructive pulmonary disease) ([place])   ? Diabetes ([place])   ? Heart disease   ? Heart failure ([place])   ? Hyperlipidemia   ? Hypertension   ? MI (myocardial infarction) ([place])   ? ?Past Surgical History:  ?Procedure Laterality Date  ? ABDOMINAL HYSTERECTOMY    ? CORONARY ARTERY BYPASS GRAFT    ? ?Social History:  reports that she has never smoked. She has never used smokeless tobacco. She reports that she does not drink  alcohol and does not use drugs. ? ?Allergies  ?Allergen Reactions  ? Compazine [Prochlorperazine]   ? Lisinopril   ? Naproxen   ? Nitroglycerin   ? Nsaids Hives  ?  Reaction Type: Allergy; Severity: Mild  ? Toradol [Ketorolac Tromethamine]   ? Tramadol   ? ? ?Family History  ?Problem Relation Age of Onset  ? Hypertension Mother   ? Heart disease Mother   ? Depression Father   ? Hypertension Father   ? Heart disease Father   ? Breast cancer Maternal Aunt   ? Cancer Maternal Aunt   ? ? ?Prior to Admission medications   ?Medication Sig Start Date End Date Taking? Authorizing Provider  ?albuterol (VENTOLIN HFA) 108 (90 Base) MCG/ACT inhaler Inhale 2 puffs into the lungs every 6 (six) hours as needed for wheezing or shortness of breath. 03/19/21   Vanessa [place], MD  ?amLODipine (NORVASC) 10 MG tablet Take 1 tablet (10 mg total) by mouth daily. 10/27/20   Cletis Athens, MD  ?aspirin EC 81 MG EC tablet Take 1 tablet (81 mg total) by mouth daily. Swallow whole. 04/13/21   Fritzi Mandes, MD  ?atorvastatin (LIPITOR) 80 MG tablet Take 1 tablet (80 mg total) by mouth daily. 10/27/20   Cletis Athens, MD  ?bumetanide (BUMEX) 2 MG tablet Take 2 mg by mouth 2 (two) times daily.    [provider]  ?carvedilol (COREG) 25 MG tablet Take 1 tablet (25 mg total) by mouth 2 (two)  times daily with a meal. 10/27/20   Cletis Athens, MD  ?clopidogrel (PLAVIX) 75 MG tablet Take 75 mg by mouth daily.    [provider]  ?Continuous Blood Gluc Sensor (FREESTYLE LIBRE 14 DAY SENSOR) MISC 1 each by Does not apply route every 14 (fourteen) days. 11/30/20   Cletis Athens, MD  ?ezetimibe (ZETIA) 10 MG tablet Take 1 tablet (10 mg total) by mouth daily. 10/27/20   Cletis Athens, MD  ?insulin glargine (LANTUS) 100 UNIT/ML injection Inject 5 Units into the skin at bedtime.    [provider]  ?insulin lispro (HUMALOG) 100 UNIT/ML injection Inject 2 Units into the skin 3 (three) times daily before meals.    [provider]   ?magnesium oxide (MAG-OX) 400 MG tablet Take 400 mg by mouth daily.    [provider]  ?ondansetron (ZOFRAN ODT) 4 MG disintegrating tablet Allow 1-2 tablets to dissolve in your mouth every 8 hours as needed for nausea/vomiting 11/18/20   Hinda Kehr, MD  ?pantoprazole (PROTONIX) 40 MG tablet Take 1 tablet (40 mg total) by mouth 2 (two) times daily. 10/27/20   Cletis Athens, MD  ?potassium chloride SA (KLOR-CON M) 20 MEQ tablet TAKE 1 TABLET BY MOUTH DAILY 03/12/21   Cletis Athens, MD  ?predniSONE (STERAPRED UNI-PAK 21 TAB) 10 MG (21) TBPK tablet 6,5,4,3,2,1 05/21/21   Lannie Fields, PA-C  ?promethazine (PHENERGAN) 12.5 MG tablet Take 1 tablet (12.5 mg total) by mouth every 12 (twelve) hours as needed for nausea or vomiting. 04/12/21   Fritzi Mandes, MD  ?ranolazine (RANEXA) 1000 MG SR tablet Take 1 tablet (1,000 mg total) by mouth 2 (two) times daily. 10/27/20   Cletis Athens, MD  ?senna (SENOKOT) 8.6 MG TABS tablet Take 1 tablet (8.6 mg total) by mouth at bedtime. 10/27/20   Cletis Athens, MD  ? ? ?Physical Exam: ?Vitals:  ? 06/11/21 1849 06/11/21 2136  ?BP: (!) 164/81 (!) 152/122  ?Pulse: 66 62  ?Resp: 17 (!) 24  ?Temp: 98.5 ?F (36.9 ?C)   ?TempSrc: Oral   ?SpO2: 98% 100%  ?Weight: 110 kg   ?Height: '5\' 8"'$  (1.727 m)   ? ?Physical Exam ?Vitals and nursing note reviewed.  ?Constitutional:   ?   General: She is not in acute distress. ?HENT:  ?   Head: Normocephalic and atraumatic.  ?Cardiovascular:  ?   Rate and Rhythm: Normal rate and regular rhythm.  ?   Pulses: Normal pulses.  ?   Heart sounds: Normal heart sounds.  ?Pulmonary:  ?   Effort: Pulmonary effort is normal.  ?   Breath sounds: Normal breath sounds.  ?Abdominal:  ?   Palpations: Abdomen is soft.  ?   Tenderness: There is no abdominal tenderness.  ?Musculoskeletal:  ?   Left shoulder: Tenderness present.  ?Neurological:  ?   Mental Status: Mental status is at baseline.  ? ? ? ?Data Reviewed: ?Relevant notes from primary care and specialist visits,  past discharge summaries as available in EHR, including Care Everywhere. ?Prior diagnostic testing as pertinent to current admission diagnoses ?Updated medications and problem lists for reconciliation ?ED course, including vitals, labs, imaging, treatment and response to treatment ?Triage notes, nursing and pharmacy notes and ED provider's notes ?Notable results as noted in HPI ? ? ?Assessment and Plan: ?* Unstable angina (HCC) ?Possible unstable angina but could be related to hypertensive urgency ?We will start heparin ?Continue morphine for pain.  Patient allergic to nitroglycerin ?Continue antiplatelet, statins and beta-blockers ?Cardiology  consult ?We will keep n.p.o. tonight in case of procedure in the a.m. ? ? ?Hypertensive urgency ?IV metoprolol for BP control ?Continue home amlodipine ? ?CAD s/p  CABG x4 07/2020 ?Management as above ? ?AKI (acute kidney injury) ([place]) ?Creatinine 1.07 up from baseline of 0.73 ?Gentle one-time IV bolus and monitor ? ?Left shoulder pain ?Patient reported superficial pain at the left shoulder even after resolution of chest pain ?Possible bursitis ?Lidoderm patch requested by patient and ordered. ? ?History of CVA (cerebrovascular accident) ?Continue aspirin and statins ? ?Chronic diastolic CHF (congestive heart failure) ([place]) ?Continue Bumex and carvedilol ? ?COPD (chronic obstructive pulmonary disease) ([place]) ?DuoNebs as needed ? ?Diabetes mellitus without complication ([place]) ?Sliding scale insulin coverage ? ?Hypertension ?Continue carvedilol, amlodipine ? ? ? ? ? ? ?Advance Care Planning:   Code Status: Prior  ? ?Consults: Cardiologist, Dr. Montel Culver ? ?Family Communication: none ? ?Severity of Illness: ?The appropriate patient status for this patient is OBSERVATION. Observation status is judged to be reasonable and necessary in order to provide the required intensity of service to ensure the patient's safety. The patient's presenting symptoms, physical exam findings, and initial  radiographic and laboratory data in the context of their medical condition is felt to place them at decreased risk for further clinical deterioration. Furthermore, it is anticipated that the patient will be m

## 2021-06-11 NOTE — Assessment & Plan Note (Deleted)
Possible unstable angina but could be related to hypertensive urgency ?Continue to trend troponins and will consider heparin if uptrending ?Continue morphine for pain.  Patient allergic to nitroglycerin ?Continue antiplatelet, statins and beta-blockers ?Cardiology consult ?

## 2021-06-12 ENCOUNTER — Observation Stay: Payer: 59

## 2021-06-12 DIAGNOSIS — M25512 Pain in left shoulder: Secondary | ICD-10-CM

## 2021-06-12 DIAGNOSIS — I200 Unstable angina: Secondary | ICD-10-CM | POA: Diagnosis not present

## 2021-06-12 DIAGNOSIS — I25110 Atherosclerotic heart disease of native coronary artery with unstable angina pectoris: Secondary | ICD-10-CM | POA: Diagnosis not present

## 2021-06-12 LAB — BASIC METABOLIC PANEL
Anion gap: 5 (ref 5–15)
BUN: 16 mg/dL (ref 6–20)
CO2: 19 mmol/L — ABNORMAL LOW (ref 22–32)
Calcium: 7 mg/dL — ABNORMAL LOW (ref 8.9–10.3)
Chloride: 114 mmol/L — ABNORMAL HIGH (ref 98–111)
Creatinine, Ser: 0.72 mg/dL (ref 0.44–1.00)
GFR, Estimated: >60 mL/min (ref >60)
Glucose, Bld: 119 mg/dL — ABNORMAL HIGH (ref 70–99)
Potassium: 2.9 mmol/L — ABNORMAL LOW (ref 3.5–5.1)
Sodium: 138 mmol/L (ref 135–145)

## 2021-06-12 LAB — CBG MONITORING, ED
Glucose-Capillary: 191 mg/dL — ABNORMAL HIGH (ref 70–99)
Glucose-Capillary: 139 mg/dL — ABNORMAL HIGH (ref 70–99)

## 2021-06-12 LAB — HEPARIN LEVEL (UNFRACTIONATED): Heparin Unfractionated: >1.1 IU/mL — ABNORMAL HIGH (ref 0.30–0.70)

## 2021-06-12 LAB — PROTIME-INR
INR: 0.9 (ref 0.8–1.2)
Prothrombin Time: 12.5 seconds (ref 11.4–15.2)

## 2021-06-12 LAB — MAGNESIUM: Magnesium: 1.7 mg/dL (ref 1.7–2.4)

## 2021-06-12 LAB — APTT: aPTT: 32 seconds (ref 24–36)

## 2021-06-12 MED ORDER — POTASSIUM CHLORIDE CRYS ER 20 MEQ PO TBCR
60.0000 meq | EXTENDED_RELEASE_TABLET | Freq: Once | ORAL | Status: AC
  Administered 2021-06-12: 60 meq via ORAL
  Filled 2021-06-12: qty 3

## 2021-06-12 MED ORDER — LIDOCAINE 5 % EX PTCH
1.0000 | MEDICATED_PATCH | CUTANEOUS | Status: DC
  Administered 2021-06-12: 1 via TRANSDERMAL
  Filled 2021-06-12: qty 1

## 2021-06-12 MED ORDER — PANTOPRAZOLE SODIUM 40 MG PO TBEC: 40.0000 mg | DELAYED_RELEASE_TABLET | Freq: Two times a day (BID) | ORAL | 3 refills | Status: AC | PRN

## 2021-06-12 MED ORDER — SENNA 8.6 MG PO TABS: 1.0000 | ORAL_TABLET | Freq: Every evening | ORAL | Status: AC | PRN

## 2021-06-12 MED ORDER — CYCLOBENZAPRINE HCL 10 MG PO TABS
10.0000 mg | ORAL_TABLET | Freq: Once | ORAL | Status: AC
  Administered 2021-06-12: 10 mg via ORAL
  Filled 2021-06-12: qty 1

## 2021-06-12 MED ORDER — GADOBUTROL 1 MMOL/ML IV SOLN
10.0000 mL | Freq: Once | INTRAVENOUS | Status: AC | PRN
  Administered 2021-06-12: 10 mL via INTRAVENOUS
  Filled 2021-06-12: qty 10

## 2021-06-12 MED ORDER — LIDOCAINE 5 % EX PTCH
1.0000 | MEDICATED_PATCH | CUTANEOUS | 0 refills | Status: AC
Start: 2021-06-12 — End: ?

## 2021-06-12 MED ORDER — HEPARIN (PORCINE) 25000 UT/250ML-% IV SOLN: 900.0000 [IU]/h | INTRAVENOUS | Status: DC

## 2021-06-12 NOTE — ED Notes (Signed)
Rodman Key RN made aware of holding heparin dose for 1 hour and resume at [facility] ?

## 2021-06-12 NOTE — Progress Notes (Signed)
ANTICOAGULATION CONSULT NOTE - Initial Consult ? ?Pharmacy Consult for Heparin  ?Indication: chest pain/ACS ? ?Allergies  ?Allergen Reactions  ? Compazine [Prochlorperazine]   ? Lisinopril   ? Naproxen   ? Nitroglycerin   ? Nsaids Hives  ?  Reaction Type: Allergy; Severity: Mild  ? Toradol [Ketorolac Tromethamine]   ? Tramadol   ? ? ?Patient Measurements: ?Height: '5\' 8"'$  (172.7 cm) ?Weight: 110 kg (242 lb 8.1 oz) ?IBW/kg (Calculated) : 63.9 ?Heparin Dosing Weight: 88.9 kg  ? ?Vital Signs: ?Temp: 98.5 ?F (36.9 ?C) (04/14 1849) ?Temp Source: Oral (04/14 1849) ?BP: 152/122 (04/14 2136) ?Pulse Rate: 62 (04/14 2136) ? ?Labs: ?Recent Labs  ?  06/11/21 ?1850 06/11/21 ?2050  ?HGB 12.1  --   ?HCT 35.9*  --   ?PLT 268  --   ?CREATININE 1.07*  --   ?TROPONINIHS 16 17  ? ? ?Estimated Creatinine Clearance: 84.4 mL/min (A) (by C-G formula based on SCr of 1.07 mg/dL (H)). ? ? ?Medical History: ?Past Medical History:  ?Diagnosis Date  ? COPD (chronic obstructive pulmonary disease) ([place])   ? Diabetes ([place])   ? Heart disease   ? Heart failure ([place])   ? Hyperlipidemia   ? Hypertension   ? MI (myocardial infarction) ([place])   ? ? ?Medications:  ?(Not in a hospital admission)  ? ?Assessment: ?Pharmacy consulted to dose heparin in this 46 year old female admitted with ACS/NSTEMI.  No prior anticoag noted. ?CrCl = 84.4 ml/min ? ?Goal of Therapy:  ?Heparin level 0.3-0.7 units/ml ?Monitor platelets by anticoagulation protocol: Yes ?  ?Plan:  ?Give 4000 units bolus x 1 ?Start heparin infusion at 1200 units/hr ?Check anti-Xa level in 6 hours and daily while on heparin ?Continue to monitor H&H and platelets ? ?Kyler Germer D ?06/12/2021,12:19 AM ? ? ?

## 2021-06-12 NOTE — Progress Notes (Signed)
ANTICOAGULATION CONSULT NOTE - Initial Consult ? ?Pharmacy Consult for Heparin  ?Indication: chest pain/ACS ? ?Allergies  ?Allergen Reactions  ? Compazine [Prochlorperazine]   ? Hydralazine Other (See Comments)  ?  Reaction Type: Side Effect; Reaction(s): racing thoughts ?Reaction Type: Side Effect; Reaction(s): racing thoughts ?Anxiety, "racing thoughts" ?Anxiety, "racing thoughts" ?  ? Lisinopril   ? Metoclopramide Other (See Comments)  ?  Other reaction(s): Hallucinations  ? Naproxen   ? Nitroglycerin   ? Nsaids Hives  ?  Reaction Type: Allergy; Severity: Mild  ? Tape Hives  ? Toradol [Ketorolac Tromethamine]   ? Tramadol   ? ? ?Patient Measurements: ?Height: '5\' 8"'$  (172.7 cm) ?Weight: 110 kg (242 lb 8.1 oz) ?IBW/kg (Calculated) : 63.9 ?Heparin Dosing Weight: 88.9 kg  ? ?Vital Signs: ?BP: 133/55 (04/15 0348) ?Pulse Rate: 65 (04/15 0348) ? ?Labs: ?Recent Labs  ?  06/11/21 ?1850 06/11/21 ?2050 06/11/21 ?2359 06/12/21 ?6226  ?HGB 12.1  --   --   --   ?HCT 35.9*  --   --   --   ?PLT 268  --   --   --   ?APTT  --   --  32  --   ?LABPROT  --   --  12.5  --   ?INR  --   --  0.9  --   ?HEPARINUNFRC  --   --   --  >1.10*  ?CREATININE 1.07*  --   --   --   ?TROPONINIHS 16 17  --   --   ? ? ? ?Estimated Creatinine Clearance: 84.4 mL/min (A) (by C-G formula based on SCr of 1.07 mg/dL (H)). ? ? ?Medical History: ?Past Medical History:  ?Diagnosis Date  ? COPD (chronic obstructive pulmonary disease) ([place])   ? Diabetes ([place])   ? Heart disease   ? Heart failure ([place])   ? Hyperlipidemia   ? Hypertension   ? MI (myocardial infarction) ([place])   ? ? ?Medications:  ?(Not in a hospital admission) ? ?Assessment: ?Pharmacy consulted to dose heparin in this 46 year old female admitted with ACS/NSTEMI.  No prior anticoag noted. ?CrCl = 84.4 ml/min ? ?4/15 0611 HL > 1.10 (supratherapeutic) ? ?Goal of Therapy:  ?Heparin level 0.3-0.7 units/ml ?Monitor platelets by anticoagulation protocol: Yes ?  ?Plan:  ?4/15 0611 HL > 1.10  (supratherapeutic) ?Hold infusion x 1 hour ?Decrease heparin infusion to 900 units/hr ?Check anti-Xa level in 6 hours following rate change ?Continue to monitor H&H and platelets ? ?Dorothe Pea, PharmD, BCPS ?Clinical Pharmacist   ?06/12/2021,7:20 AM ? ? ?

## 2021-06-12 NOTE — Consult Note (Signed)
[facility] ? ?CARDIOLOGY CONSULT NOTE  ?Patient ID: ?Kayla Caldwell ?MRN: [id number] ?DOB/AGE: 1975/11/29 46 y.o. ? ?Admit date: 06/11/2021 ?Referring Physician Marcie Mowers ?Primary Physician Peggye Form, NP ?Primary Cardiologist Andrez Grime, MD ?Reason for Consultation Chest pain, h/o CABG ? ?HPI:  ?Kayla Caldwell is a 46 y.o.female with a history of CAD status post CABG x4 on 07/2020, HFpEF type 2 diabetes, hypertension, morbid obesity, CVA who presents who presents with chest pain and has negative troponins. ? ?Since her bypass surgery in June 2022, she has had issues with intermittent chest discomfort.  Shortly after her surgery she had frequent right-sided pain, and what she thought was pain in her esophagus.  This was believed to be due to a diaphragmatic issue.  Following that she was seen in the hospital in September for which she had a negative CT of her chest, and troponin was negative.  He establish care with me later in September and describes chest pain that was worse when she belches or taking a deep breath.  She was seen at the [facility] emergency department in February 2023 with chest pain, and subsequently had a nuclear medicine stress test that was normal.  She told me that she thought this was gas pain related. ? ?She says that she was at work when she felt a pain in her neck going down her left arm. This has been presesnt since that time and seems to be painful with movement. Her breathing feels ok, but has some shortness of breath walking up stairs. She doesn't think shes had some anginal chest pain. Cardiac ROS is otherwise negative.  ? ?Review of systems complete and found to be negative unless listed above  ? ? ? ?Past Medical History:  ?Diagnosis Date  ? COPD (chronic obstructive pulmonary disease) ([place])   ? Diabetes ([place])   ? Heart disease   ? Heart failure ([place])   ? Hyperlipidemia   ? Hypertension   ? MI (myocardial infarction) ([place])   ?  ?Past Surgical History:  ?Procedure Laterality Date   ? ABDOMINAL HYSTERECTOMY    ? CORONARY ARTERY BYPASS GRAFT    ?  ?(Not in a hospital admission) ? ?Social History  ? ?Socioeconomic History  ? Marital status: Single  ?  Spouse name: Not on file  ? Number of children: Not on file  ? Years of education: Not on file  ? Highest education level: Not on file  ?Occupational History  ? Not on file  ?Tobacco Use  ? Smoking status: Never  ? Smokeless tobacco: Never  ?Vaping Use  ? Vaping Use: Never used  ?Substance and Sexual Activity  ? Alcohol use: Never  ? Drug use: Never  ? Sexual activity: Not Currently  ?Other Topics Concern  ? Not on file  ?Social History Narrative  ? Not on file  ? ?Social Determinants of Health  ? ?Financial Resource Strain: Not on file  ?Food Insecurity: Not on file  ?Transportation Needs: Not on file  ?Physical Activity: Not on file  ?Stress: Not on file  ?Social Connections: Not on file  ?Intimate Partner Violence: Not on file  ?  ?Family History  ?Problem Relation Age of Onset  ? Hypertension Mother   ? Heart disease Mother   ? Depression Father   ? Hypertension Father   ? Heart disease Father   ? Breast cancer Maternal Aunt   ? Cancer Maternal Aunt   ?  ? ? ?Review of systems complete and found  to be negative unless listed above  ? ? ? ? ?PHYSICAL EXAM ? ?General: Well developed, well nourished, in no acute distress ?HEENT:  Normocephalic and atramatic ?Neck:  No JVD.  ?Lungs: Clear bilaterally to auscultation and percussion. ?Heart: HRRR . Normal S1 and S2 without gallops or murmurs.  ?Abdomen: Bowel sounds are positive, abdomen soft and non-tender  ?Msk:  Back normal, normal gait. Normal strength and tone for age. Signficant tenderness to palpation of left neck and shoulder, reproducing her pain.  ?Extremities: No clubbing, cyanosis or edema.   ?Neuro: Alert and oriented X 3. ?Psych:  Good affect, responds appropriately ? ?Labs: ?  ?Lab Results  ?Component Value Date  ? WBC 7.0 06/11/2021  ? HGB 12.1 06/11/2021  ? HCT 35.9 (L) 06/11/2021  ?  MCV 85.9 06/11/2021  ? PLT 268 06/11/2021  ?  ?Recent Labs  ?Lab 06/11/21 ?1850 06/11/21 ?1856  ?NA 136  --   ?K 3.4*  --   ?CL 105  --   ?CO2 24  --   ?BUN 23*  --   ?CREATININE 1.07*  --   ?CALCIUM 9.1  --   ?PROT  --  7.7  ?BILITOT  --  0.4  ?ALKPHOS  --  86  ?ALT  --  14  ?AST  --  17  ?GLUCOSE 141*  --   ? ?No results found for: CKTOTAL, CKMB, CKMBINDEX, TROPONINI  ?Lab Results  ?Component Value Date  ? CHOL 110 04/12/2021  ? ?Lab Results  ?Component Value Date  ? HDL 38 (L) 04/12/2021  ? ?Lab Results  ?Component Value Date  ? [place] 62 04/12/2021  ? ?Lab Results  ?Component Value Date  ? TRIG 48 04/12/2021  ? ?Lab Results  ?Component Value Date  ? CHOLHDL 2.9 04/12/2021  ? ?No results found for: LDLDIRECT  ?  ?Radiology: DG Chest 2 View ? ?Result Date: 06/11/2021 ?CLINICAL DATA:  Chest pain EXAM: CHEST - 2 VIEW COMPARISON:  None. FINDINGS: Lungs are well expanded, symmetric, and clear. No pneumothorax or pleural effusion. Coronary artery bypass grafting has been performed. Cardiac size within normal limits. Pulmonary vascularity is normal. Osseous structures are age-appropriate. No acute bone abnormality. IMPRESSION: No active cardiopulmonary disease. Electronically Signed   By: Fidela Salisbury M.D.   On: 06/11/2021 19:17   ? ?EKG: NSR with lateral TW inversion. Similar to prior.  ? ?Echo 11/2020- INTERPRETATION ?NORMAL LEFT VENTRICULAR SYSTOLIC FUNCTION ?NORMAL RIGHT VENTRICULAR SYSTOLIC FUNCTION ?TRIVIAL REGURGITATION NOTED (See above) ?NO VALVULAR STENOSIS ? ?Stress- 04/12/2021- This result has an attachment that is not available.  ?  The study is normal. The study is low risk.  ?  LV perfusion is normal.  ?  Left ventricular function is normal. End diastolic cavity size is  ?normal.  ?  Prior study not available for comparison.  ? ?ASSESSMENT AND PLAN:  ?Kayla Caldwell is a 46 y.o.female with a history of CAD status post CABG x4 on 07/2020, HFpEF type 2 diabetes, hypertension, morbid obesity, CVA who presents who  presents with chest pain and has negative troponins. ? ?# MSK left neck and shoulder pain ?#History of CABG x 01 August 2020 ?She presents with neck and shoulder pain that is very painful to palpation and with movement; no clear chest pain, and her workup is reassuring from a cardiac perspective. Troponin negative, EKG relatively unchanged.  She has had multiple emergency department visits with chest pain including a recent visit in February 2023 at which time she  had a negative nuclear medicine stress test. I think it is safe for discharge from a cardiac perspective with close follow up. Would try a muscle relaxant for her neck pain.  ?- Continue aspirin and plavix for 1 year ?- Zetia and atorvastain 40 mg ?- coreg 25 mg BID ?- Ranexa 1000 mg BID ?- Amlodipine 10 mg  ?-Risk factor modification-lipid panel 11/24/2020-TC 130, LDL 64, HDL 50, TG 80 ?- No further cardiac workup ?- Consider muscle relaxant for neck/shoulder pain ? ?#HFpEF ?Normal LV systolic function, but has a significant Bumex requirement to maintain euvolemia.  She appears well compensated today. ?-Continue home Bumex 2 mg daily. ? ?Signed: ?Andrez Grime MD ?06/12/2021, 8:40 AM ? ?

## 2021-06-12 NOTE — ED Notes (Signed)
Matthew RN aware of assigned bed 

## 2021-06-12 NOTE — Assessment & Plan Note (Signed)
Patient reported superficial pain at the left shoulder even after resolution of chest pain ?Possible bursitis ?Lidoderm patch requested by patient and ordered. ?

## 2021-06-12 NOTE — Discharge Summary (Signed)
? ?Physician Discharge Summary ? ? ?Kayla Caldwell  female DOB: Aug 05, 1975  ?SEG:[id number] ? ?PCP: Kayla Form, NP ? ?Admit date: 06/11/2021 ?Discharge date: 06/12/2021 ? ?Admitted From: home ?Disposition:  home ?CODE STATUS: Full code ? ?Discharge Instructions   ? ? AMB referral to orthopedics   Complete by: As directed ?  ? With Dr. Leim Fabry, for rotator cuff injury  ? Discharge instructions   Complete by: As directed ?  ? Your cardiologist has seen you and cleared you of heart issues. ? ?MRI of your left shoulder showed rotator cuff muscle tears.  Please follow up with orthopedics Dr. Posey Pronto next week in his outpatient clinic. ? ? ?Dr. Enzo Bi ?- ?-  ? ?  ? ?Hospital Course:  ?For full details, please see H&P, progress notes, consult notes and ancillary notes.  ?Briefly,  ?Kayla Caldwell is a 46 y.o. female with medical history significant for CAD s/p CABG June 2022, HTN, DM, diastolic CHF who presented to the ED for evaluation of chest pain and left shoulder and arm pain. ? ?Left shoulder pain 2/2 ?Rotator cuff injury ?It appeared that left shoulder and arm pain was pt's main complaint.  Pt couldn't raise her left arm above horizontal.  No known hx of trauma to shoulder. ?--MRI left shoulder showed Moderate supraspinatus tendinosis with high-grade ?partial-thickness articular surface tear at the critical zone. And Moderate infraspinatus tendinosis with intrasubstance tear at the ?myotendinous junction. ?--Curb-sided ortho Dr. Leim Fabry who will see pt in outpatient clinic in the coming week. ?--Lidoderm patch prescribed at discharge. ? ?Atypical chest pain ?Chest pain likely referred pain from the rotator cuff injury. ?--trop neg.  Pt had a recent neg nuclear stress test in Feb 2023.   ?--cardiology consulted and determined no need for further cardiac workup. ?  ?HTN ?Cont home amlodipine, Bumex, coreg ?  ?CAD s/p  CABG x4 07/2020 ?--cont home ASA, plavix  ?--cont home Lipitor and Zetia ?  ?AKI (acute  kidney injury) (Kayla Caldwell) ?Creatinine 1.07 up from baseline of 0.73 ?--Cr back to 0.72 the next day. ?  ?History of CVA (cerebrovascular accident) ?Continue aspirin and statin ?  ?Chronic diastolic CHF (congestive heart failure) (Kayla Caldwell) ?Continue Bumex  ?  ?COPD (chronic obstructive pulmonary disease) ([place]) ?  ?Diabetes mellitus without complication (Kayla Caldwell) ?Discharged back on home insulin regimen. ? ?Hypokalemia ?Pt was not taking daily potassium supplement PTA as prescribed, but due to significant need for daily supplementation, pt was discharged on home potassium 20 mEq daily. ? ? ?Discharge Diagnoses:  ?Principal Problem: ?  Unstable angina ([place]) ?Active Problems: ?  Chest pain ?  CAD s/p  CABG x4 07/2020 ?  Hypertensive urgency ?  AKI (acute kidney injury) (Kayla Caldwell) ?  Left shoulder pain ?  Diabetes mellitus without complication (Kayla Caldwell) ?  COPD (chronic obstructive pulmonary disease) (Kayla Caldwell) ?  Chronic diastolic CHF (congestive heart failure) (Kayla Caldwell) ?  History of CVA (cerebrovascular accident) ? ? ? ? ?Discharge Instructions: ? ?Allergies as of 06/12/2021   ? ?   Reactions  ? Compazine [prochlorperazine]   ? Hydralazine Other (See Comments)  ? Reaction Type: Side Effect; Reaction(s): racing thoughts ?Reaction Type: Side Effect; Reaction(s): racing thoughts ?Anxiety, "racing thoughts" ?Anxiety, "racing thoughts"  ? Lisinopril   ? Metoclopramide Other (See Comments)  ? Other reaction(s): Hallucinations  ? Naproxen   ? Nitroglycerin   ? Nsaids Hives  ? Reaction Type: Allergy; Severity: Mild  ? Tape Hives  ? Toradol [ketorolac Tromethamine]   ?  Tramadol   ? ?  ? ?  ?Medication List  ?  ? ?STOP taking these medications   ? ?predniSONE 10 MG (21) Tbpk tablet ?Commonly known as: STERAPRED UNI-PAK 21 TAB ?  ? ?  ? ?TAKE these medications   ? ?albuterol 108 (90 Base) MCG/ACT inhaler ?Commonly known as: VENTOLIN HFA ?Inhale 2 puffs into the lungs every 6 (six) hours as needed for wheezing or shortness of breath. ?  ?amLODipine 10 MG  tablet ?Commonly known as: NORVASC ?Take 1 tablet (10 mg total) by mouth daily. ?  ?aspirin 81 MG EC tablet ?Take 1 tablet (81 mg total) by mouth daily. Swallow whole. ?  ?atorvastatin 80 MG tablet ?Commonly known as: LIPITOR ?Take 1 tablet (80 mg total) by mouth daily. ?  ?bumetanide 2 MG tablet ?Commonly known as: BUMEX ?Take 2 mg by mouth daily. ?  ?carvedilol 25 MG tablet ?Commonly known as: COREG ?Take 1 tablet (25 mg total) by mouth 2 (two) times daily with a meal. ?  ?clopidogrel 75 MG tablet ?Commonly known as: PLAVIX ?Take 75 mg by mouth daily. ?  ?ezetimibe 10 MG tablet ?Commonly known as: ZETIA ?Take 1 tablet (10 mg total) by mouth daily. ?  ?FreeStyle Libre 14 Day Sensor Misc ?1 each by Does not apply route every 14 (fourteen) days. ?  ?hydrOXYzine 25 MG tablet ?Commonly known as: ATARAX ?Take 12.5 mg by mouth at bedtime as needed. ?  ?insulin glargine 100 UNIT/ML injection ?Commonly known as: LANTUS ?Inject 5 Units into the skin at bedtime. ?  ?insulin lispro 100 UNIT/ML injection ?Commonly known as: HUMALOG ?Inject 2 Units into the skin 3 (three) times daily before meals. ?  ?lidocaine 5 % ?Commonly known as: LIDODERM ?Place 1 patch onto the skin daily. Remove & Discard patch within 12 hours or as directed by MD ?  ?magnesium oxide 400 MG tablet ?Commonly known as: MAG-OX ?Take 400 mg by mouth daily. ?  ?ondansetron 4 MG disintegrating tablet ?Commonly known as: Zofran ODT ?Allow 1-2 tablets to dissolve in your mouth every 8 hours as needed for nausea/vomiting ?  ?pantoprazole 40 MG tablet ?Commonly known as: PROTONIX ?Take 1 tablet (40 mg total) by mouth 2 (two) times daily as needed. Home med. ?What changed:  ?when to take this ?reasons to take this ?additional instructions ?  ?potassium chloride SA 20 MEQ tablet ?Commonly known as: KLOR-CON M ?TAKE 1 TABLET BY MOUTH DAILY ?  ?promethazine 12.5 MG tablet ?Commonly known as: PHENERGAN ?Take 1 tablet (12.5 mg total) by mouth every 12 (twelve) hours as  needed for nausea or vomiting. ?  ?ranolazine 1000 MG SR tablet ?Commonly known as: RANEXA ?Take 1 tablet (1,000 mg total) by mouth 2 (two) times daily. ?  ?senna 8.6 MG Tabs tablet ?Commonly known as: SENOKOT ?Take 1 tablet (8.6 mg total) by mouth at bedtime as needed. ?  ? ?  ? ? ? Follow-up Information   ? ? Leim Fabry, MD Follow up in 1 week(s).   ?Specialty: Orthopedic Surgery ?Why: for left rotator cuff injury ?Contact information: ?[address] ?[place] [address] ?[phone] ? ? ?  ?  ? ?  ?  ? ?  ? ? ?Allergies  ?Allergen Reactions  ? Compazine [Prochlorperazine]   ? Hydralazine Other (See Comments)  ?  Reaction Type: Side Effect; Reaction(s): racing thoughts ?Reaction Type: Side Effect; Reaction(s): racing thoughts ?Anxiety, "racing thoughts" ?Anxiety, "racing thoughts" ?  ? Lisinopril   ? Metoclopramide Other (  See Comments)  ?  Other reaction(s): Hallucinations  ? Naproxen   ? Nitroglycerin   ? Nsaids Hives  ?  Reaction Type: Allergy; Severity: Mild  ? Tape Hives  ? Toradol [Ketorolac Tromethamine]   ? Tramadol   ? ? ? ?The results of significant diagnostics from this hospitalization (including imaging, microbiology, ancillary and laboratory) are listed below for reference.  ? ?Consultations: ? ? ?Procedures/Studies: ?DG Chest 2 View ? ?Result Date: 06/11/2021 ?CLINICAL DATA:  Chest pain EXAM: CHEST - 2 VIEW COMPARISON:  None. FINDINGS: Lungs are well expanded, symmetric, and clear. No pneumothorax or pleural effusion. Coronary artery bypass grafting has been performed. Cardiac size within normal limits. Pulmonary vascularity is normal. Osseous structures are age-appropriate. No acute bone abnormality. IMPRESSION: No active cardiopulmonary disease. Electronically Signed   By: Fidela Salisbury M.D.   On: 06/11/2021 19:17  ? ?MR SHOULDER LEFT W WO CONTRAST ? ?Result Date: 06/12/2021 ?CLINICAL DATA:  Left shoulder pain and weakness for the past day. EXAM: MRI OF THE LEFT SHOULDER WITHOUT AND WITH  CONTRAST TECHNIQUE: Multiplanar, multisequence MR imaging of the left shoulder was performed before and after the administration of intravenous contrast. CONTRAST:  15m GADAVIST GADOBUTROL 1 MMOL/ML IV SOLN

## 2021-06-15 ENCOUNTER — Other Ambulatory Visit: Payer: Self-pay | Admitting: Orthopedic Surgery

## 2021-06-15 DIAGNOSIS — M47817 Spondylosis without myelopathy or radiculopathy, lumbosacral region: Secondary | ICD-10-CM

## 2021-06-28 ENCOUNTER — Other Ambulatory Visit: Payer: Self-pay | Admitting: Orthopedic Surgery

## 2021-06-28 ENCOUNTER — Other Ambulatory Visit: Payer: Self-pay | Admitting: Family Medicine

## 2021-06-28 DIAGNOSIS — M25812 Other specified joint disorders, left shoulder: Secondary | ICD-10-CM

## 2021-06-28 DIAGNOSIS — R2231 Localized swelling, mass and lump, right upper limb: Secondary | ICD-10-CM

## 2021-07-09 ENCOUNTER — Ambulatory Visit: Payer: 59

## 2021-07-09 ENCOUNTER — Ambulatory Visit: Admission: RE | Admit: 2021-07-09 | Payer: 59 | Source: Ambulatory Visit

## 2021-07-14 ENCOUNTER — Emergency Department: Payer: 59

## 2021-07-14 ENCOUNTER — Other Ambulatory Visit: Payer: Self-pay

## 2021-07-14 ENCOUNTER — Emergency Department
Admission: EM | Admit: 2021-07-14 | Discharge: 2021-07-14 | Disposition: A | Discharge status: Home / Self Care | Payer: 59 | Attending: Student in an Organized Health Care Education/Training Program | Admitting: Student in an Organized Health Care Education/Training Program

## 2021-07-14 DIAGNOSIS — R0789 Other chest pain: Secondary | ICD-10-CM

## 2021-07-14 DIAGNOSIS — Z951 Presence of aortocoronary bypass graft: Secondary | ICD-10-CM | POA: Insufficient documentation

## 2021-07-14 DIAGNOSIS — I10 Essential (primary) hypertension: Secondary | ICD-10-CM | POA: Insufficient documentation

## 2021-07-14 DIAGNOSIS — R072 Precordial pain: Secondary | ICD-10-CM | POA: Diagnosis not present

## 2021-07-14 DIAGNOSIS — R110 Nausea: Secondary | ICD-10-CM | POA: Diagnosis not present

## 2021-07-14 LAB — CBC
HCT: 38.8 % (ref 36.0–46.0)
Hemoglobin: 12.9 g/dL (ref 12.0–15.0)
MCH: 29.1 pg (ref 26.0–34.0)
MCHC: 33.2 g/dL (ref 30.0–36.0)
MCV: 87.6 fL (ref 80.0–100.0)
Platelets: 264 10*3/uL (ref 150–400)
RBC: 4.43 MIL/uL (ref 3.87–5.11)
RDW: 13.4 % (ref 11.5–15.5)
WBC: 6.3 10*3/uL (ref 4.0–10.5)
nRBC: 0 % (ref 0.0–0.2)

## 2021-07-14 LAB — BASIC METABOLIC PANEL
Anion gap: 9 (ref 5–15)
BUN: 18 mg/dL (ref 6–20)
CO2: 25 mmol/L (ref 22–32)
Calcium: 9.1 mg/dL (ref 8.9–10.3)
Chloride: 103 mmol/L (ref 98–111)
Creatinine, Ser: 1.02 mg/dL — ABNORMAL HIGH (ref 0.44–1.00)
GFR, Estimated: >60 mL/min (ref >60)
Glucose, Bld: 123 mg/dL — ABNORMAL HIGH (ref 70–99)
Potassium: 3.2 mmol/L — ABNORMAL LOW (ref 3.5–5.1)
Sodium: 137 mmol/L (ref 135–145)

## 2021-07-14 LAB — TROPONIN I (HIGH SENSITIVITY)
Troponin I (High Sensitivity): 21 ng/L — ABNORMAL HIGH (ref <18)
Troponin I (High Sensitivity): 21 ng/L — ABNORMAL HIGH (ref <18)

## 2021-07-14 MED ORDER — OXYCODONE-ACETAMINOPHEN 5-325 MG PO TABS
1.0000 | ORAL_TABLET | Freq: Once | ORAL | Status: AC
  Administered 2021-07-14: 1 via ORAL
  Filled 2021-07-14: qty 1

## 2021-07-14 MED ORDER — ONDANSETRON 4 MG PO TBDP
4.0000 mg | ORAL_TABLET | Freq: Once | ORAL | Status: AC
  Administered 2021-07-14: 4 mg via ORAL
  Filled 2021-07-14: qty 1

## 2021-07-14 MED ORDER — CARVEDILOL 25 MG PO TABS: 25.0000 mg | ORAL_TABLET | Freq: Two times a day (BID) | ORAL | Status: DC

## 2021-07-14 MED ORDER — CLONIDINE HCL 0.1 MG PO TABS: 0.2000 mg | ORAL_TABLET | Freq: Once | ORAL | Status: DC

## 2021-07-14 MED ORDER — CARVEDILOL 25 MG PO TABS
25.0000 mg | ORAL_TABLET | Freq: Two times a day (BID) | ORAL | Status: DC
  Administered 2021-07-14: 25 mg via ORAL
  Filled 2021-07-14: qty 1

## 2021-07-14 MED ORDER — ONDANSETRON 4 MG PO TBDP: 4.0000 mg | ORAL_TABLET | Freq: Three times a day (TID) | ORAL | 0 refills | Status: DC | PRN

## 2021-07-14 MED ORDER — AMLODIPINE BESYLATE 5 MG PO TABS
10.0000 mg | ORAL_TABLET | Freq: Once | ORAL | Status: AC
  Administered 2021-07-14: 10 mg via ORAL
  Filled 2021-07-14: qty 2

## 2021-07-14 NOTE — ED Triage Notes (Signed)
Pt c/o substernal chest pain that started today while at work with nausea, pt has a hx of CABG last year. Denies SOB or other sx.  ?

## 2021-07-14 NOTE — ED Provider Notes (Signed)
? ?[facility] ?Provider Note ? ? ? Event Date/Time  ? First MD Initiated Contact with Patient 07/14/21 1014   ?  (approximate) ? ? ?History  ? ?Chest Pain ? ? ?HPI ? ?Kayla Caldwell is a 46 y.o. female with a history of CABG as well as hypertension presents to the ER for evaluation of midsternal nonradiating chest pain that she feels like is related to the "nerves in her scar "from her CABG surgery.  His symptoms got worse this morning.  Will have intermittent episodes.  States that it was happening while at work.  Was not doing anything particularly strenuous.  Denies any pain or discomfort radiating through to her back.  Did have some mild nausea denies any abdominal pain.  No shortness of breath no cough.  States that she did not take her morning antihypertensive medications.  On review of medical records previous hospitalization she had reassuring cardiac work-up and in February had reassuring nuclear stress. ?  ? ? ?Physical Exam  ? ?Triage Vital Signs: ?ED Triage Vitals  ?Enc Vitals Group  ?   BP 07/14/21 0938 (!) 180/112  ?   Pulse Rate 07/14/21 0938 (!) 57  ?   Resp 07/14/21 0938 18  ?   Temp 07/14/21 0938 98.3 ?F (36.8 ?C)  ?   Temp Source 07/14/21 0938 Oral  ?   SpO2 07/14/21 0938 100 %  ?   Weight 07/14/21 0937 240 lb (108.9 kg)  ?   Height --   ?   Head Circumference --   ?   Peak Flow --   ?   Pain Score 07/14/21 0937 8  ?   Pain Loc --   ?   Pain Edu? --   ?   Excl. in [place]? --   ? ? ?Most recent vital signs: ?Vitals:  ? 07/14/21 1200 07/14/21 1230  ?BP: 118/86 140/66  ?Pulse: (!) 59 (!) 55  ?Resp: 16 18  ?Temp:    ?SpO2: 100% 99%  ? ? ? ?Constitutional: Alert  ?Eyes: Conjunctivae are normal.  ?Head: Atraumatic. ?Nose: No congestion/rhinnorhea. ?Mouth/Throat: Mucous membranes are moist.   ?Neck: Painless ROM.  ?Cardiovascular:   Good peripheral circulation. ?Respiratory: Normal respiratory effort.  No retractions.  ?Gastrointestinal: Soft and nontender.  ?Musculoskeletal:  no  deformity ?Neurologic:  MAE spontaneously. No gross focal neurologic deficits are appreciated.  ?Skin:  Skin is warm, dry and intact. No rash noted. ?Psychiatric: Mood and affect are normal. Speech and behavior are normal. ? ? ? ?ED Results / Procedures / Treatments  ? ?Labs ?(all labs ordered are listed, but only abnormal results are displayed) ?Labs Reviewed  ?BASIC METABOLIC PANEL - Abnormal; Notable for the following components:  ?    Result Value  ? Potassium 3.2 (*)   ? Glucose, Bld 123 (*)   ? Creatinine, Ser 1.02 (*)   ? All other components within normal limits  ?TROPONIN I (HIGH SENSITIVITY) - Abnormal; Notable for the following components:  ? Troponin I (High Sensitivity) 21 (*)   ? All other components within normal limits  ?TROPONIN I (HIGH SENSITIVITY) - Abnormal; Notable for the following components:  ? Troponin I (High Sensitivity) 21 (*)   ? All other components within normal limits  ?CBC  ?POC URINE PREG, ED  ? ? ? ?EKG ? ?ED ECG REPORT ?I, Merlyn Lot, the attending physician, personally viewed and interpreted this ECG. ? ? Date: 07/14/2021 ? EKG Time: 9:38 ? Rate: 60 ?  Rhythm: sinus ? Axis: normal ? Intervals:  normal qt ? ST&T Change: no stemi, twi unchanged from previous tracing 4/15 ? ? ? ?RADIOLOGY ?Please see ED Course for my review and interpretation. ? ?I personally reviewed all radiographic images ordered to evaluate for the above acute complaints and reviewed radiology reports and findings.  These findings were personally discussed with the patient.  Please see medical record for radiology report. ? ? ? ?PROCEDURES: ? ?Critical Care performed:  ? ?Procedures ? ? ?MEDICATIONS ORDERED IN ED: ?Medications  ?carvedilol (COREG) tablet 25 mg (25 mg Oral Given 07/14/21 1118)  ?amLODipine (NORVASC) tablet 10 mg (10 mg Oral Given 07/14/21 1102)  ?oxyCODONE-acetaminophen (PERCOCET/ROXICET) 5-325 MG per tablet 1 tablet (1 tablet Oral Given 07/14/21 1103)  ?ondansetron (ZOFRAN-ODT) disintegrating  tablet 4 mg (4 mg Oral Given 07/14/21 1103)  ? ? ? ?IMPRESSION / MDM / ASSESSMENT AND PLAN / ED COURSE  ?I reviewed the triage vital signs and the nursing notes. ?             ?               ? ?Differential diagnosis includes, but is not limited to, ACS, pericarditis, esophagitis, boerhaaves, pe, dissection, pna, bronchitis, costochondritis ? ?Patient presenting to the ER for evaluation of symptoms as described above.  She is hypertensive but nontoxic-appearing afebrile.  Her EKG is unchanged as compared to previous. This presenting complaint could reflect a potentially life-threatening illness therefore the patient will be placed on continuous pulse oximetry and telemetry for monitoring.  Laboratory evaluation will be sent to evaluate for the above complaints.   ? ? ?Clinical Course as of 07/14/21 1254  ?Wed Jul 14, 2021  ?1031 Initial troponin borderline elevated.  She is hypertensive but did not take her a.m. medications we will redose her morning antihypertensive medications.  Does not seem clinically consistent with PE she is low risk by Wells criteria Allies PERC negative just had D-dimer that was negative.  Seems very likely musculoskeletal not consistent with dissection.  Her abdominal exam soft benign.  Chest x-ray on my interpretation shows no pneumothorax or infiltrates. [PR]  ?1253 Repeat troponin stable.  Blood pressure improved with giving her a.m. blood pressure medications.  And her risk factors have considered admission but as she otherwise feels well given reassuring work-up and recent admission for same with reassuring cardiac work-up I do believe she stable and appropriate for further work-up as outpatient.  Patient agrees to plan. [PR]  ?  ?Clinical Course User Index ?[PR] Merlyn Lot, MD  ? ? ? ?FINAL CLINICAL IMPRESSION(S) / ED DIAGNOSES  ? ?Final diagnoses:  ?Atypical chest pain  ?Hypertension, unspecified type  ? ? ? ?Rx / DC Orders  ? ?ED Discharge Orders   ? ?      Ordered  ?   ondansetron (ZOFRAN-ODT) 4 MG disintegrating tablet  Every 8 hours PRN       ? 07/14/21 1252  ? ?  ?  ? ?  ? ? ? ?Note:  This document was prepared using Dragon voice recognition software and may include unintentional dictation errors. ? ?  ?Merlyn Lot, MD ?07/14/21 1254 ? ?

## 2021-09-05 ENCOUNTER — Emergency Department
Admission: EM | Admit: 2021-09-05 | Discharge: 2021-09-05 | Disposition: A | Discharge status: Home / Self Care | Payer: Medicare Other | Attending: Student in an Organized Health Care Education/Training Program | Admitting: Student in an Organized Health Care Education/Training Program

## 2021-09-05 ENCOUNTER — Emergency Department: Payer: Medicare Other

## 2021-09-05 ENCOUNTER — Other Ambulatory Visit: Payer: Self-pay

## 2021-09-05 DIAGNOSIS — Z794 Long term (current) use of insulin: Secondary | ICD-10-CM | POA: Insufficient documentation

## 2021-09-05 DIAGNOSIS — R519 Headache, unspecified: Secondary | ICD-10-CM | POA: Diagnosis not present

## 2021-09-05 DIAGNOSIS — R531 Weakness: Secondary | ICD-10-CM | POA: Diagnosis not present

## 2021-09-05 DIAGNOSIS — R7309 Other abnormal glucose: Secondary | ICD-10-CM | POA: Insufficient documentation

## 2021-09-05 DIAGNOSIS — I639 Cerebral infarction, unspecified: Secondary | ICD-10-CM | POA: Insufficient documentation

## 2021-09-05 DIAGNOSIS — Z20822 Contact with and (suspected) exposure to covid-19: Secondary | ICD-10-CM | POA: Diagnosis not present

## 2021-09-05 DIAGNOSIS — Z7902 Long term (current) use of antithrombotics/antiplatelets: Secondary | ICD-10-CM | POA: Insufficient documentation

## 2021-09-05 DIAGNOSIS — E119 Type 2 diabetes mellitus without complications: Secondary | ICD-10-CM | POA: Insufficient documentation

## 2021-09-05 DIAGNOSIS — R200 Anesthesia of skin: Secondary | ICD-10-CM

## 2021-09-05 DIAGNOSIS — R209 Unspecified disturbances of skin sensation: Secondary | ICD-10-CM | POA: Insufficient documentation

## 2021-09-05 DIAGNOSIS — Z79899 Other long term (current) drug therapy: Secondary | ICD-10-CM | POA: Diagnosis not present

## 2021-09-05 DIAGNOSIS — I10 Essential (primary) hypertension: Secondary | ICD-10-CM | POA: Diagnosis not present

## 2021-09-05 LAB — COMPREHENSIVE METABOLIC PANEL
ALT: 15 U/L (ref 0–44)
AST: 21 U/L (ref 15–41)
Albumin: 4.1 g/dL (ref 3.5–5.0)
Alkaline Phosphatase: 76 U/L (ref 38–126)
Anion gap: 9 (ref 5–15)
BUN: 16 mg/dL (ref 6–20)
CO2: 23 mmol/L (ref 22–32)
Calcium: 9.2 mg/dL (ref 8.9–10.3)
Chloride: 107 mmol/L (ref 98–111)
Creatinine, Ser: 1.11 mg/dL — ABNORMAL HIGH (ref 0.44–1.00)
GFR, Estimated: >60 mL/min (ref >60)
Glucose, Bld: 136 mg/dL — ABNORMAL HIGH (ref 70–99)
Potassium: 4 mmol/L (ref 3.5–5.1)
Sodium: 139 mmol/L (ref 135–145)
Total Bilirubin: 0.9 mg/dL (ref 0.3–1.2)
Total Protein: 7.9 g/dL (ref 6.5–8.1)

## 2021-09-05 LAB — URINALYSIS, ROUTINE W REFLEX MICROSCOPIC
Bilirubin Urine: NEGATIVE
Glucose, UA: NEGATIVE mg/dL
Hgb urine dipstick: NEGATIVE
Ketones, ur: NEGATIVE mg/dL
Leukocytes,Ua: NEGATIVE
Nitrite: NEGATIVE
Protein, ur: NEGATIVE mg/dL
Specific Gravity, Urine: 1.015 (ref 1.005–1.030)
pH: 7 (ref 5.0–8.0)

## 2021-09-05 LAB — HEMOGLOBIN A1C
Hgb A1c MFr Bld: 6.7 % — ABNORMAL HIGH (ref 4.8–5.6)
Mean Plasma Glucose: 145.59 mg/dL

## 2021-09-05 LAB — CBC
HCT: 39.5 % (ref 36.0–46.0)
Hemoglobin: 13.4 g/dL (ref 12.0–15.0)
MCH: 29.5 pg (ref 26.0–34.0)
MCHC: 33.9 g/dL (ref 30.0–36.0)
MCV: 87 fL (ref 80.0–100.0)
Platelets: 276 10*3/uL (ref 150–400)
RBC: 4.54 MIL/uL (ref 3.87–5.11)
RDW: 13.2 % (ref 11.5–15.5)
WBC: 7 10*3/uL (ref 4.0–10.5)
nRBC: 0 % (ref 0.0–0.2)

## 2021-09-05 LAB — DIFFERENTIAL
Abs Immature Granulocytes: 0.02 10*3/uL (ref 0.00–0.07)
Basophils Absolute: 0 10*3/uL (ref 0.0–0.1)
Basophils Relative: 0 %
Eosinophils Absolute: 0.2 10*3/uL (ref 0.0–0.5)
Eosinophils Relative: 3 %
Immature Granulocytes: 0 %
Lymphocytes Relative: 31 %
Lymphs Abs: 2.2 10*3/uL (ref 0.7–4.0)
Monocytes Absolute: 0.6 10*3/uL (ref 0.1–1.0)
Monocytes Relative: 9 %
Neutro Abs: 4 10*3/uL (ref 1.7–7.7)
Neutrophils Relative %: 57 %

## 2021-09-05 LAB — URINE DRUG SCREEN, QUALITATIVE (ARMC ONLY)
Amphetamines, Ur Screen: NOT DETECTED
Barbiturates, Ur Screen: NOT DETECTED
Benzodiazepine, Ur Scrn: NOT DETECTED
Cannabinoid 50 Ng, Ur ~~LOC~~: POSITIVE — AB
Cocaine Metabolite,Ur ~~LOC~~: NOT DETECTED
MDMA (Ecstasy)Ur Screen: NOT DETECTED
Methadone Scn, Ur: NOT DETECTED
Opiate, Ur Screen: NOT DETECTED
Phencyclidine (PCP) Ur S: NOT DETECTED
Tricyclic, Ur Screen: NOT DETECTED

## 2021-09-05 LAB — PROTIME-INR
INR: 1 (ref 0.8–1.2)
Prothrombin Time: 12.8 seconds (ref 11.4–15.2)

## 2021-09-05 LAB — RESP PANEL BY RT-PCR (FLU A&B, COVID) ARPGX2
Influenza A by PCR: NEGATIVE
Influenza B by PCR: NEGATIVE
SARS Coronavirus 2 by RT PCR: NEGATIVE

## 2021-09-05 LAB — CBG MONITORING, ED: Glucose-Capillary: 138 mg/dL — ABNORMAL HIGH (ref 70–99)

## 2021-09-05 LAB — APTT: aPTT: 28 seconds (ref 24–36)

## 2021-09-05 LAB — ETHANOL: Alcohol, Ethyl (B): <10 mg/dL (ref <10)

## 2021-09-05 MED ORDER — LABETALOL HCL 5 MG/ML IV SOLN
INTRAVENOUS | Status: AC
  Filled 2021-09-05: qty 4

## 2021-09-05 MED ORDER — SODIUM CHLORIDE 0.9 % IV BOLUS
500.0000 mL | Freq: Once | INTRAVENOUS | Status: AC
  Administered 2021-09-05: 500 mL via INTRAVENOUS

## 2021-09-05 MED ORDER — LABETALOL HCL 5 MG/ML IV SOLN
10.0000 mg | Freq: Once | INTRAVENOUS | Status: AC
  Administered 2021-09-05: 10 mg via INTRAVENOUS

## 2021-09-05 MED ORDER — DROPERIDOL 2.5 MG/ML IJ SOLN
2.5000 mg | Freq: Once | INTRAMUSCULAR | Status: AC
  Administered 2021-09-05: 2.5 mg via INTRAVENOUS
  Filled 2021-09-05: qty 2

## 2021-09-05 MED ORDER — ACETAMINOPHEN 325 MG PO TABS: 650.0000 mg | ORAL_TABLET | ORAL | Status: DC | PRN

## 2021-09-05 MED ORDER — IOHEXOL 350 MG/ML SOLN
75.0000 mL | Freq: Once | INTRAVENOUS | Status: AC | PRN
  Administered 2021-09-05: 75 mL via INTRAVENOUS

## 2021-09-05 MED ORDER — LORAZEPAM 0.5 MG PO TABS
0.5000 mg | ORAL_TABLET | Freq: Once | ORAL | Status: AC
  Administered 2021-09-05: 0.5 mg via ORAL
  Filled 2021-09-05: qty 1

## 2021-09-05 MED ORDER — LABETALOL HCL 5 MG/ML IV SOLN: 10.0000 mg | Freq: Once | INTRAVENOUS | Status: DC

## 2021-09-05 NOTE — Progress Notes (Signed)
1658 code stroke initiated 1655 [facility] paged by facility per note 1658 [facility] on screen/bedside

## 2021-09-05 NOTE — Progress Notes (Signed)
[place] visited patient in response to a Code Stroke. Patient was seated on edge of hospital bed upon arrival. Patient feels like she is fine and is ready for discharge. Alert and oriented. No signs of pain seen or expressed. Is waiting for physician for discharge. Has strong support system. No further needs expressed at this time.   Rev. Miranda Frese Evelena Asa, M.Div.  Healthcare Chaplain

## 2021-09-05 NOTE — ED Provider Notes (Signed)
Patient was signed out to me at 8 PM pending read of MRI.  MRI does not have any acute stroke there is some chronic findings.  Patient is already on dual antiplatelet therapy.  Discussed findings with the patient.  Plan is for discharge with outpatient follow-up.  Patient is agreeable with this.   Rada Hay, MD 09/05/21 2108

## 2021-09-05 NOTE — Discharge Instructions (Signed)

## 2021-09-05 NOTE — Consult Note (Signed)
[facility] Telemedicine Consult   Requesting Provider: Dr. Quentin Cornwall  Chief Complaint: right facial pain, right sided numbness  HPI: 46 year old female with history of hypertension, hyperlipidemia, diabetes, CHF, morbid obesity, MI/CAD status post CABG presented to ED for right facial pain.  Per patient, she was doing laundry and folding clothes at 3:30 PM when she had acute onset right facial pain, feeling sharp and throbbing with right frontal headache.  In triage, BP 192/89, glucose 138.  On examination, also endorsed right-sided numbness and heaviness.  Code stroke activated.  CT no acute abnormality.  Back from CT, on my examination patient still has right facial pain and also right face arm and leg decreased sensation but no motor deficit.  Neuro exam otherwise unremarkable.  BP 220/124, given 10 mg labetalol IV.  Patient denies any chest pain, shortness of breath, speech difficulty.  At baseline patient independent, on aspirin, Plavix, Lipitor, Zetia and insulin at home.  Had recent ED visit in 06/2021 for atypical chest pain, EKG and troponin level negative, BP was high, after treating BP, patient's symptoms improved and discharged from ED.  LKW: 3:30 PM tpa given?: No, nondisabling symptoms IR Thrombectomy? No, low NIH, no sign of LVO Modified Rankin Scale: 0-Completely asymptomatic and back to baseline post- stroke   Exam: Vitals:   09/05/21 1715 09/05/21 1720  BP: (!) 212/112 (!) 220/123  Pulse: 73 65  Resp: 15 15  Temp:    SpO2: 100% 100%     Temp:  [98.3 F (36.8 C)] 98.3 F (36.8 C) (07/09 1623) Pulse Rate:  [65-73] 65 (07/09 1720) Resp:  [15-20] 15 (07/09 1720) BP: (192-220)/(89-123) 220/123 (07/09 1720) SpO2:  [99 %-100 %] 100 % (07/09 1720) Weight:  [108.9 kg] 108.9 kg (07/09 1623)  General - Well nourished, well developed, in acute distress due to right facial pain.  Ophthalmologic - fundi not visualized due to noncooperation.  Cardiovascular -  Regular rhythm and rate.  Neuro - awake, alert, eyes open, orientated to age, place, time. No aphasia, fluent language, following all simple commands. Able to name and repeat and read. No gaze palsy, tracking bilaterally, visual field full. No facial droop. Tongue midline. Bilateral UEs 5/5, no drift. Bilaterally LEs 5/5, no drift. Sensation decreased on the right face arm or leg, complaining of right facial pain, but no sensory neglect, b/l FTN and HTS intact, gait not tested.  Marland Kitchen   NIH Stroke Scale  Level Of Consciousness 0=Alert; keenly responsive 1=Arouse to minor stimulation 2=Requires repeated stimulation to arouse or movements to pain 3=postures or unresponsive 0  LOC Questions to Month and Age 80=Answers both questions correctly 1=Answers one question correctly or dysarthria/intubated/trauma/language barrier 2=Answers neither question correctly or aphasia 0  LOC Commands      -Open/Close eyes     -Open/close grip     -Pantomime commands if communication barrier 0=Performs both tasks correctly 1=Performs one task correctly 2=Performs neighter task correctly 0  Best Gaze     -Only assess horizontal gaze 0=Normal 1=Partial gaze palsy 2=Forced deviation, or total gaze paresis 0  Visual 0=No visual loss 1=Partial hemianopia 2=Complete hemianopia 3=Bilateral hemianopia (blind including cortical blindness) 0  Facial Palsy     -Use grimace if obtunded 0=Normal symmetrical movement 1=Minor paralysis (asymmetry) 2=Partial paralysis (lower face) 3=Complete paralysis (upper and lower face) 0  Motor  0=No drift for 10/5 seconds 1=Drift, but does not hit bed 2=Some antigravity effort, hits  bed 3=No effort against gravity, limb falls 4=No movement 0=Amputation/joint fusion  Right Arm 0     Leg 0    Left Arm 0     Leg 0  Limb Ataxia     - FNT/HTS 0=Absent or does not understand or paralyzed or amputation/joint fusion 1=Present in one limb 2=Present in two limbs 0  Sensory  0=Normal 1=Mild to moderate sensory loss 2=Severe to total sensory loss or coma/unresponsive 1  Best Language 0=No aphasia, normal 1=Mild to moderate aphasia 2=Severe aphasia 3=Mute, global aphasia, or coma/unresponsive 0  Dysarthria 0=Normal 1=Mild to moderate 2=Severe, unintelligible or mute/anarthric 0=intubated/unable to test 0  Extinction/Neglect 0=No abnormality 1=visual/tactile/auditory/spatia/personal inattention/Extinction to bilateral simultaneous stimulation 2=Profound neglect/extinction more than 1 modality  0  Total   1      Imaging Reviewed:  CT HEAD CODE STROKE WO CONTRAST  Addendum Date: 09/05/2021   ADDENDUM REPORT: 09/05/2021 17:21 ADDENDUM: Findings discussed with Dr. Quentin Cornwall via telephone at 5:19 p.m. Electronically Signed   By: Margaretha Sheffield M.D.   On: 09/05/2021 17:21   Result Date: 09/05/2021 CLINICAL DATA:  Code stroke.  Neuro deficit, acute, stroke suspected EXAM: CT HEAD WITHOUT CONTRAST TECHNIQUE: Contiguous axial images were obtained from the base of the skull through the vertex without intravenous contrast. RADIATION DOSE REDUCTION: This exam was performed according to the departmental dose-optimization program which includes automated exposure control, adjustment of the mA and/or kV according to patient size and/or use of iterative reconstruction technique. COMPARISON:  None Available. FINDINGS: Brain: No evidence of acute large vascular territory infarction, hemorrhage, hydrocephalus, extra-axial collection or mass lesion/mass effect. Mild patchy white matter hypodensities, nonspecific but compatible with chronic microvascular ischemic disease. Vascular: No hyperdense vessel identified. Skull: No acute fracture. Sinuses/Orbits: Clear sinuses.  No acute orbital findings. Other: No mastoid effusions. ASPECTS [facility] Stroke Program Early CT Score total score (0-10 with 10 being normal): 10. IMPRESSION: 1. No evidence of acute intracranial abnormality. 2.  ASPECTS is 10. These results will be called to the ordering clinician or representative by the Radiologist Assistant, and communication documented in the PACS or [employer]. Electronically Signed: By: Margaretha Sheffield M.D. On: 09/05/2021 17:15     Labs reviewed in epic and pertinent values follow: Cre 1.11, platelet 276, INR 1.0, UDS pending  Assessment:  46 year old female with history of hypertension, hyperlipidemia, diabetes, CHF, morbid obesity, MI/CAD status post CABG presented to ED for right facial pain.  On exam also had right sided decree sensation.  Time onset 3:30 PM.  NIH score 1 for sensory.  CT no acute abnormality.  BP 220/124, given 10 mg labetalol IV.  Patient not at TNK candidate given none disabling symptoms.  Not candidate given no LVO sign and low NIH score.  Etiology for patient symptoms not quite clear, could be hypertensive encephalopathy, however DDx including thalamic stroke and trigeminal neuralgia.  Given her stroke risk factors, will continue stroke work-up with MRI, CTA head and neck.  Patient had normal 2D echo in 11/2020, if she stays overnight, will repeat 2D echo, otherwise can consider outpatient 2D echo.   Recommendations:  Continue further stroke work up  Frequent neuro checks Telemetry monitoring MRI brain stat CTA head and neck stat If stays overnight, will repeat 2D echo, otherwise can consider outpatient 2D echo. UDS, lipid panel and HgbA1C Received labetalol '10mg'$  IV PRN, gradually lower BP, no more than 15% within first hour and no more than 25% within first 24h. Gradually normalize BP in 2-3 days.  GI and DVT prophylaxis  OK to continue home ASA and  plavix once BP better controlled If above work up negative, and pt symptom improved and BP better controlled, she can be discharged from ED from neuro standpoint with close outpt neuro follow up. Otherwise, she can stay for overnight observation.  Discussed with Dr. Quentin Cornwall ED physician We will  follow    Consult Participants: Stroke response RN, bedside RN, ED pharmacist, patient and me Location of the provider: [facility] Location of the patient: [facility]  Time Code Stroke Page received:  4:55pm Time neurologist arrived:  5:00pm Time NIHSS completed: 5:20pm    This consult was provided via telemedicine with 2-way video and audio communication. The patient/family was informed that care would be provided in this way and agreed to receive care in this manner.   This patient is receiving care for possible acute neurological changes. There was 60 minutes of care by this provider at the time of service, including time for direct evaluation via telemedicine, review of medical records, imaging studies and discussion of findings with providers, the patient and/or family.  Rosalin Hawking, MD PhD Stroke Neurology 09/05/2021 5:37 PM

## 2021-09-05 NOTE — ED Notes (Signed)
Patient transported to CT 

## 2021-09-05 NOTE — ED Notes (Signed)
Pt states she feels better and requesting to leave. EDP made aware

## 2021-09-05 NOTE — ED Triage Notes (Signed)
Pt comes with c/o right sided face pain. Pt states this started today. Pt denies any other symptoms. Pt states it is throbbing.

## 2021-09-05 NOTE — ED Provider Notes (Signed)
[facility] Provider Note    Event Date/Time   First MD Initiated Contact with Patient 09/05/21 1641     (approximate)   History   Facial Pain   HPI  Kayla Caldwell is a 46 y.o. female extensive cardiovascular disease history presents to the ER for evaluation of sinus right facial pain headache associated with weakness and tingling of the entire right side of her body.  Started roughly around 1 hour prior to arrival at 3:30 PM.  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  Denies any trauma no fevers no chills denies any chest pain or pressure.     Physical Exam   Triage Vital Signs: ED Triage Vitals  Enc Vitals Group     BP 09/05/21 1623 (!) 192/89     Pulse Rate 09/05/21 1623 70     Resp 09/05/21 1623 20     Temp 09/05/21 1623 98.3 F (36.8 C)     Temp Source 09/05/21 1623 Oral     SpO2 09/05/21 1623 99 %     Weight 09/05/21 1623 240 lb (108.9 kg)     Height 09/05/21 1623 '5\' 4"'$  (1.626 m)     Head Circumference --      Peak Flow --      Pain Score 09/05/21 1621 9     Pain Loc --      Pain Edu? --      Excl. in [place]? --     Most recent vital signs: Vitals:   09/05/21 1734 09/05/21 1837  BP: (!) 167/80 (!) 144/119  Pulse: 65 68  Resp: 19 16  Temp:    SpO2: 100% 100%     Constitutional: Alert  Eyes: Conjunctivae are normal.  Head: Atraumatic. Nose: No congestion/rhinnorhea. Mouth/Throat: Mucous membranes are moist.   Neck: Painless ROM.  Cardiovascular:   Good peripheral circulation. Respiratory: Normal respiratory effort.  No retractions.  Gastrointestinal: Soft and nontender.  Musculoskeletal:  no deformity Neurologic: Reduced strength flexion extension of the right upper extremity as well is right lower extremity.  Able to hold leg up against gravity but falls after a few seconds.  Sensation intact to light touch but describes paresthesias of the right side.  No facial droop.  Cranial nerves intact. Skin:  Skin  is warm, dry and intact. No rash noted. Psychiatric: Mood and affect are normal. Speech and behavior are normal.    ED Results / Procedures / Treatments   Labs (all labs ordered are listed, but only abnormal results are displayed) Labs Reviewed  COMPREHENSIVE METABOLIC PANEL - Abnormal; Notable for the following components:      Result Value   Glucose, Bld 136 (*)    Creatinine, Ser 1.11 (*)    All other components within normal limits  URINE DRUG SCREEN, QUALITATIVE ([facility] ONLY) - Abnormal; Notable for the following components:   Cannabinoid 50 Ng, Ur [place] POSITIVE (*)    All other components within normal limits  CBG MONITORING, ED - Abnormal; Notable for the following components:   Glucose-Capillary 138 (*)    All other components within normal limits  RESP PANEL BY RT-PCR (FLU A&B, COVID) ARPGX2  ETHANOL  PROTIME-INR  APTT  CBC  DIFFERENTIAL  URINALYSIS, ROUTINE W REFLEX MICROSCOPIC  HEMOGLOBIN A1C  POC URINE PREG, ED     EKG  ED ECG REPORT I, Merlyn Lot, the attending physician, personally viewed and interpreted this ECG.   Date: 09/05/2021  EKG Time: 17:17  Rate: 80  Rhythm: sinus  Axis: normal  Intervals: normal  ST&T Change: no stemi, no depressios    RADIOLOGY Please see ED Course for my review and interpretation.  I personally reviewed all radiographic images ordered to evaluate for the above acute complaints and reviewed radiology reports and findings.  These findings were personally discussed with the patient.  Please see medical record for radiology report.    PROCEDURES:  Critical Care performed:   ED EKG  Date/Time: 09/05/2021 4:48 PM  Performed by: Merlyn Lot, MD Authorized by: Duffy Bruce, MD      MEDICATIONS ORDERED IN ED: Medications  acetaminophen (TYLENOL) tablet 650 mg (has no administration in time range)  labetalol (NORMODYNE) injection 10 mg ( Intravenous Not Given 09/05/21 1753)  droperidol (INAPSINE) 2.5  MG/ML injection 2.5 mg (2.5 mg Intravenous Given 09/05/21 1751)  sodium chloride 0.9 % bolus 500 mL (500 mLs Intravenous New Bag/Given 09/05/21 1751)  iohexol (OMNIPAQUE) 350 MG/ML injection 75 mL (75 mLs Intravenous Contrast Given 09/05/21 1807)  LORazepam (ATIVAN) tablet 0.5 mg (0.5 mg Oral Given 09/05/21 1953)     IMPRESSION / MDM / ASSESSMENT AND PLAN / ED COURSE  I reviewed the triage vital signs and the nursing notes.                              Differential diagnosis includes, but is not limited to, cva, tia, hypoglycemia, dehydration, electrolyte abnormality, dissection, sepsis  Patient presenting with headache but also on exam has evidence of right-sided weakness within window for code stroke.  Patient made code stroke.  This presenting complaint could reflect a potentially life-threatening illness therefore the patient will be placed on continuous pulse oximetry and telemetry for monitoring.  Laboratory evaluation will be sent to evaluate for the above complaints.  Patient transferred to the main ER for closer hemodynamic monitoring and code stroke evaluation.   Clinical Course as of 09/05/21 Eliezer Lofts Sep 05, 2021  1714 CT head on my review does not show any evidence of hemorrhage. [PR]  1736 Patient seen by teleneurology not felt to meet criteria for TNK.  Has recommended CTA head and neck as well as MRI brain.  Will be given IV labetalol due to concern for possible hypertensive encephalopathy.  Patient complaining of headache may be complex migraine will give droperidol IV.  We will continue to monitor. [PR]  1929 Patient told nurse she was requesting discharge as she felt significant improvement after droperidol.  She is feeling somewhat antsy.  I discussed her CTA imaging and my concern for possible CVA and after discussion with neurology recommend strongly that she stay for MRI to rule out stroke as if she does have evidence of acute CVA will need admission and if this is negative would  be appropriate for outpatient follow-up as she is already on dual antiplatelet therapy.  Patient agreeable to plan. [PR]    Clinical Course User Index [PR] Merlyn Lot, MD   Patient signed out to oncoming physician pending follow up MRI.    FINAL CLINICAL IMPRESSION(S) / ED DIAGNOSES   Final diagnoses:  Acute nonintractable headache, unspecified headache type  Hypertension, unspecified type  Right sided weakness     Rx / DC Orders   ED Discharge Orders     None        Note:  This document was prepared using Dragon voice recognition software and may  include unintentional dictation errors.    Merlyn Lot, MD 09/05/21 Karl Bales

## 2021-09-05 NOTE — ED Notes (Signed)
Pt removed herself from monitor. States that she is wanting to leave.

## 2021-09-06 ENCOUNTER — Emergency Department
Admission: EM | Admit: 2021-09-06 | Discharge: 2021-09-06 | Discharge status: Against Medical Advice | Payer: Medicare Other | Source: Home / Self Care

## 2021-09-07 ENCOUNTER — Other Ambulatory Visit: Payer: Self-pay

## 2021-09-07 ENCOUNTER — Emergency Department
Admission: EM | Admit: 2021-09-07 | Discharge: 2021-09-07 | Disposition: A | Discharge status: Home / Self Care | Payer: Medicare Other | Attending: Emergency Medicine | Admitting: Emergency Medicine

## 2021-09-07 DIAGNOSIS — K0889 Other specified disorders of teeth and supporting structures: Secondary | ICD-10-CM | POA: Diagnosis present

## 2021-09-07 DIAGNOSIS — K029 Dental caries, unspecified: Secondary | ICD-10-CM | POA: Insufficient documentation

## 2021-09-07 MED ORDER — HYDROCODONE-ACETAMINOPHEN 5-325 MG PO TABS: 1.0000 | ORAL_TABLET | Freq: Four times a day (QID) | ORAL | 0 refills | Status: DC | PRN

## 2021-09-07 MED ORDER — AMOXICILLIN 875 MG PO TABS: 875.0000 mg | ORAL_TABLET | Freq: Two times a day (BID) | ORAL | 0 refills | Status: DC

## 2021-09-07 NOTE — ED Triage Notes (Signed)
Pt here with right upper dental pain x2 days. Pt has an appt to get her tooth pulled tomorrow.

## 2021-09-07 NOTE — Discharge Instructions (Signed)
Follow-up with your dentist for your scheduled appointment tomorrow.  Return emergency department worsening.  Take medication as prescribed

## 2021-09-07 NOTE — ED Notes (Signed)
See triage note  Presents with dental pain.  States pain started couple of days ago   Min swelling noted to right side of face

## 2021-09-07 NOTE — ED Provider Notes (Signed)
[facility] Provider Note    Event Date/Time   First MD Initiated Contact with Patient 09/07/21 [phone]     (approximate)   History   Dental Pain   HPI  Kayla Caldwell is a 46 y.o. female presents emergency department with complaints of dental pain.  Patient states she was seen the other day with facial pain and had a negative work-up for stroke.  States now she notices her tooth.  States been very achy and there is a cavity noted.  Has an appointment with a dentist tomorrow.  No fever or chills.  No chest pain or shortness of breath.      Physical Exam   Triage Vital Signs: ED Triage Vitals  Enc Vitals Group     BP 09/07/21 0821 (!) 162/95     Pulse Rate 09/07/21 0821 70     Resp 09/07/21 0821 18     Temp 09/07/21 0821 98.5 F (36.9 C)     Temp Source 09/07/21 0821 Oral     SpO2 09/07/21 0821 100 %     Weight 09/07/21 0819 240 lb 1.3 oz (108.9 kg)     Height 09/07/21 0819 '5\' 4"'$  (1.626 m)     Head Circumference --      Peak Flow --      Pain Score 09/07/21 0819 10     Pain Loc --      Pain Edu? --      Excl. in [place]? --     Most recent vital signs: Vitals:   09/07/21 0821  BP: (!) 162/95  Pulse: 70  Resp: 18  Temp: 98.5 F (36.9 C)  SpO2: 100%     General: Awake, no distress.   CV:  Good peripheral perfusion. regular rate and  rhythm Resp:  Normal effort. Lungs CTA Abd:  No distention.   Other:  Patient has a large cavity noted in the right upper molar   ED Results / Procedures / Treatments   Labs (all labs ordered are listed, but only abnormal results are displayed) Labs Reviewed - No data to display   EKG     RADIOLOGY     PROCEDURES:   Procedures   MEDICATIONS ORDERED IN ED: Medications - No data to display   IMPRESSION / MDM / [place] / ED COURSE  I reviewed the triage vital signs and the nursing notes.                              Differential diagnosis includes, but is not limited to,  dental pain, dental abscess, dental caries  Patient's presentation is most consistent with acute, uncomplicated illness.   Patient's exam is consistent with dental pain secondary to dental caries.  She was given a prescription for amoxicillin and Vicodin.  Patient has multiple allergies.  PDMP shows that she has not had a narcotic since February 2023.  She does have an appointment for tomorrow with a dentist.  She is to follow-up for them to address the tooth problem.  Explained to her that she may have an abscess up above the root of the tooth but we cannot see that here in the ED.  She will need dental x-rays.  Patient is in agreement treatment plan.  She was discharged stable condition.      FINAL CLINICAL IMPRESSION(S) / ED DIAGNOSES   Final diagnoses:  Pain, dental  Rx / DC Orders   ED Discharge Orders          Ordered    amoxicillin (AMOXIL) 875 MG tablet  2 times daily        09/07/21 0833    HYDROcodone-acetaminophen (NORCO/VICODIN) 5-325 MG tablet  Every 6 hours PRN        09/07/21 0981             Note:  This document was prepared using Dragon voice recognition software and may include unintentional dictation errors.    Versie Starks, PA-C 09/07/21 1914    Naaman Plummer, MD 09/11/21 [phone]

## 2021-09-20 ENCOUNTER — Encounter: Payer: Self-pay | Admitting: Emergency Medicine

## 2021-09-20 DIAGNOSIS — K047 Periapical abscess without sinus: Secondary | ICD-10-CM | POA: Diagnosis present

## 2021-09-20 DIAGNOSIS — Z5321 Procedure and treatment not carried out due to patient leaving prior to being seen by health care provider: Secondary | ICD-10-CM | POA: Diagnosis not present

## 2021-09-20 NOTE — ED Triage Notes (Signed)
Pt arrived via [facility] from home with c/o continued right sided mouth pain from known dental abscess x2 weeks. Pt seen for same multiple times and has been on antibiotics x10 days. Pt to have tooth removed but sts can not due to chronic blood thinner medication. Pt to ED due to inability to tolerate and manage pain.

## 2021-09-21 ENCOUNTER — Emergency Department
Admission: EM | Admit: 2021-09-21 | Discharge: 2021-09-21 | Discharge status: Against Medical Advice | Payer: Medicare Other | Attending: Emergency Medicine | Admitting: Emergency Medicine

## 2021-09-26 ENCOUNTER — Ambulatory Visit: Admit: 2021-09-26 | Discharge: 2021-09-27 | Disposition: A | Discharge status: Home / Self Care | Payer: MEDICARE

## 2021-09-26 DIAGNOSIS — U071 COVID-19: Principal | ICD-10-CM

## 2021-09-28 ENCOUNTER — Telehealth: Payer: Self-pay | Admitting: Emergency Medicine

## 2021-09-28 ENCOUNTER — Emergency Department: Payer: Medicare Other

## 2021-09-28 ENCOUNTER — Emergency Department
Admission: EM | Admit: 2021-09-28 | Discharge: 2021-09-28 | Disposition: A | Discharge status: Home / Self Care | Payer: Medicare Other | Attending: Emergency Medicine | Admitting: Emergency Medicine

## 2021-09-28 ENCOUNTER — Encounter: Payer: Self-pay | Admitting: Emergency Medicine

## 2021-09-28 DIAGNOSIS — R944 Abnormal results of kidney function studies: Secondary | ICD-10-CM | POA: Insufficient documentation

## 2021-09-28 DIAGNOSIS — J449 Chronic obstructive pulmonary disease, unspecified: Secondary | ICD-10-CM | POA: Diagnosis not present

## 2021-09-28 DIAGNOSIS — Z7982 Long term (current) use of aspirin: Secondary | ICD-10-CM | POA: Diagnosis not present

## 2021-09-28 DIAGNOSIS — Z7902 Long term (current) use of antithrombotics/antiplatelets: Secondary | ICD-10-CM | POA: Insufficient documentation

## 2021-09-28 DIAGNOSIS — Z951 Presence of aortocoronary bypass graft: Secondary | ICD-10-CM | POA: Insufficient documentation

## 2021-09-28 DIAGNOSIS — E119 Type 2 diabetes mellitus without complications: Secondary | ICD-10-CM | POA: Insufficient documentation

## 2021-09-28 DIAGNOSIS — I2510 Atherosclerotic heart disease of native coronary artery without angina pectoris: Secondary | ICD-10-CM | POA: Diagnosis not present

## 2021-09-28 DIAGNOSIS — I10 Essential (primary) hypertension: Secondary | ICD-10-CM | POA: Diagnosis not present

## 2021-09-28 DIAGNOSIS — Z79899 Other long term (current) drug therapy: Secondary | ICD-10-CM | POA: Insufficient documentation

## 2021-09-28 DIAGNOSIS — K029 Dental caries, unspecified: Secondary | ICD-10-CM | POA: Diagnosis not present

## 2021-09-28 DIAGNOSIS — K0889 Other specified disorders of teeth and supporting structures: Secondary | ICD-10-CM | POA: Diagnosis present

## 2021-09-28 LAB — CBC WITH DIFFERENTIAL/PLATELET
Abs Immature Granulocytes: 0.01 10*3/uL (ref 0.00–0.07)
Basophils Absolute: 0 10*3/uL (ref 0.0–0.1)
Basophils Relative: 0 %
Eosinophils Absolute: 0.1 10*3/uL (ref 0.0–0.5)
Eosinophils Relative: 2 %
HCT: 40.9 % (ref 36.0–46.0)
Hemoglobin: 13.9 g/dL (ref 12.0–15.0)
Immature Granulocytes: 0 %
Lymphocytes Relative: 38 %
Lymphs Abs: 1.8 10*3/uL (ref 0.7–4.0)
MCH: 29.4 pg (ref 26.0–34.0)
MCHC: 34 g/dL (ref 30.0–36.0)
MCV: 86.5 fL (ref 80.0–100.0)
Monocytes Absolute: 0.6 10*3/uL (ref 0.1–1.0)
Monocytes Relative: 12 %
Neutro Abs: 2.3 10*3/uL (ref 1.7–7.7)
Neutrophils Relative %: 48 %
Platelets: 202 10*3/uL (ref 150–400)
RBC: 4.73 MIL/uL (ref 3.87–5.11)
RDW: 12.8 % (ref 11.5–15.5)
WBC: 4.7 10*3/uL (ref 4.0–10.5)
nRBC: 0 % (ref 0.0–0.2)

## 2021-09-28 LAB — BASIC METABOLIC PANEL
Anion gap: 7 (ref 5–15)
BUN: 21 mg/dL — ABNORMAL HIGH (ref 6–20)
CO2: 29 mmol/L (ref 22–32)
Calcium: 8.7 mg/dL — ABNORMAL LOW (ref 8.9–10.3)
Chloride: 101 mmol/L (ref 98–111)
Creatinine, Ser: 1.33 mg/dL — ABNORMAL HIGH (ref 0.44–1.00)
GFR, Estimated: 49 mL/min — ABNORMAL LOW (ref >60)
Glucose, Bld: 184 mg/dL — ABNORMAL HIGH (ref 70–99)
Potassium: 3.9 mmol/L (ref 3.5–5.1)
Sodium: 137 mmol/L (ref 135–145)

## 2021-09-28 MED ORDER — SODIUM CHLORIDE 0.9 % IV SOLN
3.0000 g | Freq: Once | INTRAVENOUS | Status: AC
  Administered 2021-09-28: 3 g via INTRAVENOUS
  Filled 2021-09-28: qty 8

## 2021-09-28 MED ORDER — IOHEXOL 300 MG/ML  SOLN
75.0000 mL | Freq: Once | INTRAMUSCULAR | Status: AC | PRN
  Administered 2021-09-28: 75 mL via INTRAVENOUS

## 2021-09-28 MED ORDER — ONDANSETRON 4 MG PO TBDP: 4.0000 mg | ORAL_TABLET | Freq: Four times a day (QID) | ORAL | 0 refills | Status: DC | PRN

## 2021-09-28 MED ORDER — PENICILLIN V POTASSIUM 500 MG PO TABS: 500.0000 mg | ORAL_TABLET | Freq: Four times a day (QID) | ORAL | 0 refills | Status: DC

## 2021-09-28 MED ORDER — PROBIOTIC 1-250 BILLION-MG PO CAPS: 1.0000 | ORAL_CAPSULE | Freq: Every day | ORAL | 0 refills | Status: DC

## 2021-09-28 MED ORDER — HYDROMORPHONE HCL 1 MG/ML IJ SOLN
1.0000 mg | Freq: Once | INTRAMUSCULAR | Status: AC
  Administered 2021-09-28: 1 mg via INTRAVENOUS
  Filled 2021-09-28: qty 1

## 2021-09-28 MED ORDER — ONDANSETRON HCL 4 MG/2ML IJ SOLN
4.0000 mg | Freq: Once | INTRAMUSCULAR | Status: AC
  Administered 2021-09-28: 4 mg via INTRAVENOUS
  Filled 2021-09-28: qty 2

## 2021-09-28 MED ORDER — OXYCODONE-ACETAMINOPHEN 5-325 MG PO TABS: 1.0000 | ORAL_TABLET | ORAL | 0 refills | Status: DC | PRN

## 2021-09-28 MED ORDER — OXYCODONE-ACETAMINOPHEN 5-325 MG PO TABS: 1.0000 | ORAL_TABLET | ORAL | 0 refills | Status: AC | PRN

## 2021-09-28 MED ORDER — OXYCODONE-ACETAMINOPHEN 5-325 MG PO TABS
1.0000 | ORAL_TABLET | Freq: Once | ORAL | Status: AC
  Administered 2021-09-28: 1 via ORAL
  Filled 2021-09-28: qty 1

## 2021-09-28 MED ORDER — PROBIOTIC 1-250 BILLION-MG PO CAPS: 1.0000 | ORAL_CAPSULE | Freq: Every day | ORAL | 0 refills | Status: AC

## 2021-09-28 MED ORDER — SODIUM CHLORIDE 0.9 % IV BOLUS (SEPSIS)
1000.0000 mL | Freq: Once | INTRAVENOUS | Status: AC
  Administered 2021-09-28: 1000 mL via INTRAVENOUS

## 2021-09-28 NOTE — Discharge Instructions (Addendum)

## 2021-09-28 NOTE — ED Provider Notes (Signed)
[facility] Provider Note    Event Date/Time   First MD Initiated Contact with Patient 09/28/21 0210     (approximate)   History   Dental Pain   HPI  Kayla Caldwell is a 46 y.o. female with history of hypertension, hyperlipidemia, CAD, diabetes, COPD who presents to the emergency department with complaints of right lower dental pain for the past several days.  Does not have a dentist.  Complains of facial swelling and pain with opening her mouth.  No fever.  Previously on clindamycin several weeks ago for dental pain but states she had to stop with antibiotic prematurely because it upset her stomach.   History provided by patient and EMS.    Past Medical History:  Diagnosis Date   COPD (chronic obstructive pulmonary disease) ([place])    Diabetes ([place])    Heart disease    Heart failure (HCC)    Hyperlipidemia    Hypertension    MI (myocardial infarction) ([place])     Past Surgical History:  Procedure Laterality Date   ABDOMINAL HYSTERECTOMY     CARDIAC SURGERY     CORONARY ARTERY BYPASS GRAFT      MEDICATIONS:  Prior to Admission medications   Medication Sig Start Date End Date Taking? Authorizing Provider  albuterol (VENTOLIN HFA) 108 (90 Base) MCG/ACT inhaler Inhale 2 puffs into the lungs every 6 (six) hours as needed for wheezing or shortness of breath. 03/19/21   Vanessa Sherrelwood, MD  amLODipine (NORVASC) 10 MG tablet Take 1 tablet (10 mg total) by mouth daily. 10/27/20   Cletis Athens, MD  amoxicillin (AMOXIL) 875 MG tablet Take 1 tablet (875 mg total) by mouth 2 (two) times daily. 09/07/21   Fisher, Linden Dolin, PA-C  aspirin EC 81 MG EC tablet Take 1 tablet (81 mg total) by mouth daily. Swallow whole. 04/13/21   Fritzi Mandes, MD  atorvastatin (LIPITOR) 80 MG tablet Take 1 tablet (80 mg total) by mouth daily. 10/27/20   Cletis Athens, MD  bumetanide (BUMEX) 2 MG tablet Take 2 mg by mouth daily.    [provider]  carvedilol (COREG) 25 MG tablet  Take 1 tablet (25 mg total) by mouth 2 (two) times daily with a meal. 10/27/20   Cletis Athens, MD  clopidogrel (PLAVIX) 75 MG tablet Take 75 mg by mouth daily.    [provider]  Continuous Blood Gluc Sensor (FREESTYLE LIBRE 14 DAY SENSOR) MISC 1 each by Does not apply route every 14 (fourteen) days. 11/30/20   Cletis Athens, MD  ezetimibe (ZETIA) 10 MG tablet Take 1 tablet (10 mg total) by mouth daily. 10/27/20   Cletis Athens, MD  HYDROcodone-acetaminophen (NORCO/VICODIN) 5-325 MG tablet Take 1 tablet by mouth every 6 (six) hours as needed for moderate pain. 09/07/21   Fisher, Linden Dolin, PA-C  hydrOXYzine (ATARAX) 25 MG tablet Take 12.5 mg by mouth at bedtime as needed.    [provider]  insulin glargine (LANTUS) 100 UNIT/ML injection Inject 5 Units into the skin at bedtime.    [provider]  insulin lispro (HUMALOG) 100 UNIT/ML injection Inject 2 Units into the skin 3 (three) times daily before meals.    [provider]  lidocaine (LIDODERM) 5 % Place 1 patch onto the skin daily. Remove & Discard patch within 12 hours or as directed by MD 06/12/21   Enzo Bi, MD  magnesium oxide (MAG-OX) 400 MG tablet Take 400 mg by mouth daily.  [provider]  ondansetron (ZOFRAN ODT) 4 MG disintegrating tablet Allow 1-2 tablets to dissolve in your mouth every 8 hours as needed for nausea/vomiting 11/18/20   Hinda Kehr, MD  ondansetron (ZOFRAN-ODT) 4 MG disintegrating tablet Take 1 tablet (4 mg total) by mouth every 8 (eight) hours as needed for nausea or vomiting. 07/14/21   Merlyn Lot, MD  pantoprazole (PROTONIX) 40 MG tablet Take 1 tablet (40 mg total) by mouth 2 (two) times daily as needed. Home med. 06/12/21   Enzo Bi, MD  potassium chloride SA (KLOR-CON M) 20 MEQ tablet TAKE 1 TABLET BY MOUTH DAILY 03/12/21   Cletis Athens, MD  promethazine (PHENERGAN) 12.5 MG tablet Take 1 tablet (12.5 mg total) by mouth every 12 (twelve) hours as needed for nausea or  vomiting. 04/12/21   Fritzi Mandes, MD  ranolazine (RANEXA) 1000 MG SR tablet Take 1 tablet (1,000 mg total) by mouth 2 (two) times daily. 10/27/20   Cletis Athens, MD  senna (SENOKOT) 8.6 MG TABS tablet Take 1 tablet (8.6 mg total) by mouth at bedtime as needed. 06/12/21   Enzo Bi, MD    Physical Exam   Triage Vital Signs: ED Triage Vitals  Enc Vitals Group     BP 09/28/21 0049 (!) 172/103     Pulse Rate 09/28/21 0049 79     Resp 09/28/21 0049 (!) 22     Temp 09/28/21 0049 98.4 F (36.9 C)     Temp Source 09/28/21 0049 Oral     SpO2 09/28/21 0049 100 %     Weight 09/28/21 0047 242 lb 8.1 oz (110 kg)     Height 09/28/21 0047 '5\' 4"'$  (1.626 m)     Head Circumference --      Peak Flow --      Pain Score --      Pain Loc --      Pain Edu? --      Excl. in [place]? --     Most recent vital signs: Vitals:   09/28/21 0333 09/28/21 0448  BP: (!) 153/93 (!) 189/93  Pulse: 62 (!) 57  Resp: 20 18  Temp:  98.6 F (37 C)  SpO2: 97% 100%    CONSTITUTIONAL: Alert and oriented and responds appropriately to questions.  Appears uncomfortable, crying and moaning in pain HEAD: Normocephalic, atraumatic EYES: Conjunctivae clear, pupils appear equal, sclera nonicteric ENT: normal nose; moist mucous membranes; Dental caries present with tenderness over the right lower molars, no drainable dental abscess noted, she has some soft tissue swelling noted at the right inner cheek, gums appear normal without bleeding, no Ludwig's angina, tongue sits flat in the bottom of the mouth, no angioedema, no facial erythema or warmth, no facial swelling, moves neck without difficulty, normal phonation without stridor, drooling.  She does have some mild trismus. NECK: Supple, normal ROM, no cervical lymphadenopathy CARD: RRR; S1 and S2 appreciated; no murmurs, no clicks, no rubs, no gallops RESP: Normal chest excursion without splinting or tachypnea; breath sounds clear and equal bilaterally; no wheezes, no rhonchi, no  rales, no hypoxia or respiratory distress, speaking full sentences ABD/GI: Normal bowel sounds; non-distended; soft, non-tender, no rebound, no guarding, no peritoneal signs BACK: The back appears normal EXT: Normal ROM in all joints; no deformity noted, no edema; no cyanosis SKIN: Normal color for age and race; warm; no rash on exposed skin NEURO: Moves all extremities equally, normal speech PSYCH: The patient's mood and manner are appropriate.   ED Results /  Procedures / Treatments   LABS: (all labs ordered are listed, but only abnormal results are displayed) Labs Reviewed  BASIC METABOLIC PANEL - Abnormal; Notable for the following components:      Result Value   Glucose, Bld 184 (*)    BUN 21 (*)    Creatinine, Ser 1.33 (*)    Calcium 8.7 (*)    GFR, Estimated 49 (*)    All other components within normal limits  CBC WITH DIFFERENTIAL/PLATELET     EKG:   RADIOLOGY: My personal review and interpretation of imaging: CT scan shows no abscess or facial cellulitis.  I have personally reviewed all radiology reports.   CT Maxillofacial W Contrast  Result Date: 09/28/2021 CLINICAL DATA:  Dental pain on the right for 3 weeks EXAM: CT MAXILLOFACIAL WITH CONTRAST TECHNIQUE: Multidetector CT imaging of the maxillofacial structures was performed with intravenous contrast. Multiplanar CT image reconstructions were also generated. RADIATION DOSE REDUCTION: This exam was performed according to the departmental dose-optimization program which includes automated exposure control, adjustment of the mA and/or kV according to patient size and/or use of iterative reconstruction technique. CONTRAST:  34m OMNIPAQUE IOHEXOL 300 MG/ML  SOLN COMPARISON:  09/05/2021 FINDINGS: Osseous: Cavity in tooth 32 with minimal periapical lucency. No adjacent soft tissue inflammation is seen. Benign sclerosis in the lingual aspect of the left mandibular body. Orbits: Negative for mass or inflammation Sinuses:  Essentially clear Soft tissues: No acute finding. Notable retropharyngeal carotids with atheromatous plaque. Limited intracranial: Tortuous intracranial vessels correlating with chart history of hypertension. IMPRESSION: Carious tooth 32 but no evidence of odontogenic soft tissue infection or abscess. Electronically Signed   By: JJorje GuildM.D.   On: 09/28/2021 04:09     PROCEDURES:  Critical Care performed: No      Procedures    IMPRESSION / MDM / ASSESSMENT AND PLAN / ED COURSE  I reviewed the triage vital signs and the nursing notes.    Patient here with dental caries causing dental pain with some trismus and swelling in the right inner cheek.    DIFFERENTIAL DIAGNOSIS (includes but not limited to):   Dental caries causing dental pain, dental abscess, facial cellulitis   Patient's presentation is most consistent with acute presentation with potential threat to life or bodily function.   PLAN: We will obtain CBC, BMP, CT of the face with contrast.  Will give Unasyn, pain and nausea medicine.  She received oxycodone in the waiting room without much relief.   MEDICATIONS GIVEN IN ED: Medications  oxyCODONE-acetaminophen (PERCOCET/ROXICET) 5-325 MG per tablet 1 tablet (1 tablet Oral Given 09/28/21 0049)  sodium chloride 0.9 % bolus 1,000 mL (0 mLs Intravenous Stopped 09/28/21 0416)  Ampicillin-Sulbactam (UNASYN) 3 g in sodium chloride 0.9 % 100 mL IVPB (0 g Intravenous Stopped 09/28/21 0416)  ondansetron (ZOFRAN) injection 4 mg (4 mg Intravenous Given 09/28/21 0316)  HYDROmorphone (DILAUDID) injection 1 mg (1 mg Intravenous Given 09/28/21 0317)  iohexol (OMNIPAQUE) 300 MG/ML solution 75 mL (75 mLs Intravenous Contrast Given 09/28/21 0348)     ED COURSE: Patient's labs show no leukocytosis.  Slightly elevated creatinine of 1.33.  She has received IV fluids here and has had mildly elevated creatinine intermittently over the past year.  CT of the face reviewed and interpreted by  myself and the radiologist shows carious tooth #32 but no abscess, facial cellulitis.  Will discharge home with short course of analgesia and close dental follow-up.  Patient feels much better and verbalized understanding and  is comfortable with the plan.   CONSULTS: No emergent dental consult needed given no obvious sign of odontogenic infection or abscess.  No Ludwigs.   OUTSIDE RECORDS REVIEWED: Reviewed patient's last OB/GYN note on 06/10/2021 and orthopedic note on 06/15/2021.       FINAL CLINICAL IMPRESSION(S) / ED DIAGNOSES   Final diagnoses:  Pain due to dental caries     Rx / DC Orders   ED Discharge Orders          Ordered    oxyCODONE-acetaminophen (PERCOCET) 5-325 MG tablet  Every 4 hours PRN        09/28/21 0420    ondansetron (ZOFRAN-ODT) 4 MG disintegrating tablet  Every 6 hours PRN        09/28/21 0420    Bacillus Coagulans-Inulin (PROBIOTIC) 1-250 BILLION-MG CAPS  Daily        09/28/21 0420    penicillin v potassium (VEETID) 500 MG tablet  4 times daily        09/28/21 0420             Note:  This document was prepared using Dragon voice recognition software and may include unintentional dictation errors.   Katrice Goel, Delice Bison, DO 09/28/21 [phone]

## 2021-09-28 NOTE — Telephone Encounter (Signed)
Pt seen in ED overnight, prescriptions sent by Dr. Leonides Schanz at that time. However, electronic rx submission failed, verified in Epic. Will re-send.

## 2021-09-28 NOTE — ED Triage Notes (Signed)
Pt presents via EMS for complaints of dental pain on the right side of her mouth for the last 3 week. She notes that her Dentist wont pull her tooth due to her having a CABG last year. Pt notes taking Tylenol PTA without improvement. Denies difficulty swallowing.

## 2021-09-28 NOTE — ED Triage Notes (Signed)
Pt to ED via [facility] from home with c/o dental pain. Pt seen 7/24 for same. Per EMS pt with hx of HTN and BP en route was elevated. Per EMS pt with hx of dental carries, and dentist refusing to pull tooth due to cardiac surgery approx 1 yr ago. Per EMS pt refusing to stop rocking back and forth en route.

## 2021-10-14 ENCOUNTER — Encounter: Payer: Self-pay | Admitting: Emergency Medicine

## 2021-10-14 ENCOUNTER — Ambulatory Visit
Admission: EM | Admit: 2021-10-14 | Discharge: 2021-10-14 | Disposition: A | Discharge status: Home / Self Care | Payer: Medicare Other | Attending: Family Medicine | Admitting: Family Medicine

## 2021-10-14 DIAGNOSIS — K0889 Other specified disorders of teeth and supporting structures: Secondary | ICD-10-CM | POA: Diagnosis not present

## 2021-10-14 MED ORDER — HYDROCODONE-ACETAMINOPHEN 7.5-325 MG PO TABS: 1.0000 | ORAL_TABLET | Freq: Four times a day (QID) | ORAL | 0 refills | Status: AC | PRN

## 2021-10-14 NOTE — ED Triage Notes (Signed)
Pt reports right ear and jaw pain x 3 days. States she got a tooth pulled on 8/11 and contacted the dentist and they advised her to be seen at UC due to the pain.

## 2021-10-14 NOTE — ED Provider Notes (Signed)
[facility]    CSN: [id number] Arrival date & time: 10/14/21  [place]      History   Chief Complaint Chief Complaint  Patient presents with   Otalgia   Jaw Pain    HPI Kayla Caldwell is a 46 y.o. female.   HPI  Kayla Caldwell states she had a tooth pulled up last week.  States that she heard a popping during the procedure.  She feels like she cannot open her mouth and not like to eat.  Her jaw feels tight.  Notes that she has been sweating at work.  She has been taking amoxicillin.  Says it hurts to force previously hurting for over a month.  She denies nausea, vomiting, headache, neck pain.  States that she has not been using a straw or smoking cigarettes and has been following all of her aftercare plans.  Her right ear pain that is worse when cold air hits her face.  Described as throbbing radiates to her lower jaw.  She called the dentist who directed her to the ED. says that she has taken 14 Tylenol in the last day.  Tylenol is not helping her pain.  She had a CABG in the last year and was told not to take ibuprofen as she is taking Plavix.    Past Medical History:  Diagnosis Date   COPD (chronic obstructive pulmonary disease) ([place])    Diabetes ([place])    Heart disease    Heart failure ([place])    Hyperlipidemia    Hypertension    MI (myocardial infarction) [facility])     Patient Active Problem List   Diagnosis Date Noted   Left shoulder pain 06/12/2021   History of CVA (cerebrovascular accident) 06/11/2021   Hypertensive urgency 06/11/2021   AKI (acute kidney injury) ([facility]) 06/11/2021   Unstable angina ([place]) 06/11/2021   Chest pain 04/11/2021   HLD (hyperlipidemia) 04/11/2021   Diabetes mellitus without complication ([place]) 43/32/9518   COPD (chronic obstructive pulmonary disease) ([place]) 04/11/2021   CAD (coronary artery disease) 04/11/2021   Hypokalemia 04/11/2021   Obesity, Class III, BMI 40-49.9 (morbid obesity) ([place]) 04/11/2021   Chronic diastolic CHF (congestive  heart failure) ([place]) 04/11/2021   Positive D-dimer    Class 3 severe obesity due to excess calories without serious comorbidity with body mass index (BMI) of 40.0 to 44.9 in adult ([place]) 10/27/2020   Atelectasis, left    Lobar pneumonia ([place]) 10/11/2020   CAD (coronary artery disease), native coronary artery 10/11/2020   CAD 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      Home Medications    Prior to Admission medications   Medication Sig Start Date End Date Taking? Authorizing Provider  HYDROcodone-acetaminophen (NORCO) 7.5-325 MG tablet Take 1 tablet by mouth every 6 (six) hours as needed for up to 3 days for moderate pain. 10/14/21 10/17/21 Yes Rhylen Pulido, DO  albuterol (VENTOLIN HFA) 108 (90 Base) MCG/ACT inhaler Inhale 2 puffs into the lungs every 6 (six) hours as needed for wheezing or shortness of breath. 03/19/21   Vanessa , MD  amLODipine (NORVASC) 10 MG tablet Take 1 tablet (10 mg total) by mouth daily. 10/27/20  Cletis Athens, MD  aspirin EC 81 MG EC tablet Take 1 tablet (81 mg total) by mouth daily. Swallow whole. 04/13/21   Fritzi Mandes, MD  atorvastatin (LIPITOR) 80 MG tablet Take 1 tablet (80 mg total) by mouth daily. 10/27/20   Cletis Athens, MD  Bacillus Coagulans-Inulin (PROBIOTIC) 1-250 BILLION-MG CAPS Take 1 capsule by mouth daily. 09/28/21   Carrie Mew, MD  bumetanide (BUMEX) 2 MG tablet Take 2 mg by mouth daily.    [provider]  carvedilol (COREG) 25 MG tablet Take 1 tablet (25 mg total) by mouth 2 (two) times daily with a meal. 10/27/20   Cletis Athens, MD  clopidogrel (PLAVIX) 75 MG tablet Take 75 mg by mouth daily.    [provider]  Continuous Blood Gluc Sensor (FREESTYLE LIBRE 14 DAY SENSOR) MISC 1 each  by Does not apply route every 14 (fourteen) days. 11/30/20   Cletis Athens, MD  ezetimibe (ZETIA) 10 MG tablet Take 1 tablet (10 mg total) by mouth daily. 10/27/20   Cletis Athens, MD  hydrOXYzine (ATARAX) 25 MG tablet Take 12.5 mg by mouth at bedtime as needed.    [provider]  insulin glargine (LANTUS) 100 UNIT/ML injection Inject 5 Units into the skin at bedtime.    [provider]  insulin lispro (HUMALOG) 100 UNIT/ML injection Inject 2 Units into the skin 3 (three) times daily before meals.    [provider]  lidocaine (LIDODERM) 5 % Place 1 patch onto the skin daily. Remove & Discard patch within 12 hours or as directed by MD 06/12/21   Enzo Bi, MD  magnesium oxide (MAG-OX) 400 MG tablet Take 400 mg by mouth daily.    [provider]  ondansetron (ZOFRAN-ODT) 4 MG disintegrating tablet Take 1 tablet (4 mg total) by mouth every 6 (six) hours as needed for nausea or vomiting. 09/28/21   Carrie Mew, MD  pantoprazole (PROTONIX) 40 MG tablet Take 1 tablet (40 mg total) by mouth 2 (two) times daily as needed. Home med. 06/12/21   Enzo Bi, MD  penicillin v potassium (VEETID) 500 MG tablet Take 1 tablet (500 mg total) by mouth 4 (four) times daily. 09/28/21   Carrie Mew, MD  potassium chloride SA (KLOR-CON M) 20 MEQ tablet TAKE 1 TABLET BY MOUTH DAILY 03/12/21   Cletis Athens, MD  promethazine (PHENERGAN) 12.5 MG tablet Take 1 tablet (12.5 mg total) by mouth every 12 (twelve) hours as needed for nausea or vomiting. 04/12/21   Fritzi Mandes, MD  ranolazine (RANEXA) 1000 MG SR tablet Take 1 tablet (1,000 mg total) by mouth 2 (two) times daily. 10/27/20   Cletis Athens, MD  senna (SENOKOT) 8.6 MG TABS tablet Take 1 tablet (8.6 mg total) by mouth at bedtime as needed. 06/12/21   Enzo Bi, MD    Family History Family History  Problem Relation Age of Onset   Hypertension Mother    Heart disease Mother    Depression Father    Hypertension Father    Heart  disease Father    Breast cancer Maternal Aunt    Cancer Maternal Aunt     Social History Social History   Tobacco Use   Smoking status: Never   Smokeless tobacco: Never  Vaping Use   Vaping Use: Never used  Substance Use Topics   Alcohol use: Never   Drug use: Never     Allergies   Compazine [prochlorperazine], Hydralazine, Lisinopril, Metoclopramide, Naproxen, Nitroglycerin, Nsaids, Tape, Toradol [ketorolac tromethamine], and  Tramadol   Review of Systems Review of Systems : :negative unless otherwise stated in HPI.      Physical Exam Triage Vital Signs ED Triage Vitals  Enc Vitals Group     BP 10/14/21 1856 (!) 163/88     Pulse Rate 10/14/21 1856 89     Resp 10/14/21 1856 16     Temp --      Temp Source 10/14/21 1856 Oral     SpO2 10/14/21 1854 99 %     Weight --      Height --      Head Circumference --      Peak Flow --      Pain Score 10/14/21 1854 9     Pain Loc --      Pain Edu? --      Excl. in [place]? --    No data found.  Updated Vital Signs BP (!) 163/88 (BP Location: Left Arm)   Pulse 89   Resp 16   SpO2 100%   Visual Acuity Right Eye Distance:   Left Eye Distance:   Bilateral Distance:    Right Eye Near:   Left Eye Near:    Bilateral Near:     Physical Exam  GEN:    alert, non-ill appearing female and no distress    HENT:  mucus membranes moist, oropharyngeal without lesions, tonsils without hypertrophy, no erythema ,no nasal discharge, right TM opaque, left TM normal  EYES:   pupils equal and reactive, no scleral injection NECK:  normal ROM RESP:  no increased work of breathing  CVS:   regular rate  Skin:   warm and dry, no rash on face    UC Treatments / Results  Labs (all labs ordered are listed, but only abnormal results are displayed) Labs Reviewed - No data to display  EKG   Radiology No results found.  Procedures Procedures (including critical care time)  Medications Ordered in UC Medications - No data to  display  Initial Impression / Assessment and Plan / UC Course  I have reviewed the triage vital signs and the nursing notes.  Pertinent labs & imaging results that were available during my care of the patient were reviewed by me and considered in my medical decision making (see chart for details).     Patient is a 46 year old female who presents for worsening dental pain after dental extraction on 10/08/2021.  Reviewed maxillofacial surgery notes.  Though she states that she has not been using any straws, she had a cup with a straw in it today.  I wonder if she is developing a dry socket versus nerve injury from her recent procedure.  She is taking amoxicillin and states she has several days left.  She reports she was told she cannot take ibuprofen and is allergic to Toradol and tramadol.  We will give a short course of Norco for pain control.  Advised patient to follow-up with her maxillofacial surgeon.  She voiced understanding.  Discussed MDM, treatment plan and plan for follow-up with patient/parent who agrees with plan.     Final Clinical Impressions(s) / UC Diagnoses   Final diagnoses:  Pain, dental     Discharge Instructions      I sent some pain medication to your pharmacy.  Do not drive or heavy operate machinery while taking this medication.  Be sure to follow-up with your dentist.     ED Prescriptions     Medication Sig Dispense Auth. Provider  HYDROcodone-acetaminophen (NORCO) 7.5-325 MG tablet Take 1 tablet by mouth every 6 (six) hours as needed for up to 3 days for moderate pain. 12 tablet Bevelyn Arriola, DO      I have reviewed the PDMP during this encounter.   Lyndee Hensen, DO 10/14/21 2057

## 2021-10-14 NOTE — Discharge Instructions (Addendum)
I sent some pain medication to your pharmacy.  Do not drive or heavy operate machinery while taking this medication.  Be sure to follow-up with your dentist.

## 2021-11-03 ENCOUNTER — Ambulatory Visit
Admission: EM | Admit: 2021-11-03 | Discharge: 2021-11-03 | Disposition: A | Discharge status: Home / Self Care | Payer: Medicare Other | Attending: Physician Assistant | Admitting: Physician Assistant

## 2021-11-03 DIAGNOSIS — K047 Periapical abscess without sinus: Secondary | ICD-10-CM

## 2021-11-03 DIAGNOSIS — K0889 Other specified disorders of teeth and supporting structures: Secondary | ICD-10-CM | POA: Diagnosis not present

## 2021-11-03 MED ORDER — HYDROCODONE-ACETAMINOPHEN 5-325 MG PO TABS: 1.0000 | ORAL_TABLET | Freq: Four times a day (QID) | ORAL | 0 refills | Status: AC | PRN

## 2021-11-03 MED ORDER — AMOXICILLIN-POT CLAVULANATE 875-125 MG PO TABS: 1.0000 | ORAL_TABLET | Freq: Two times a day (BID) | ORAL | 0 refills | Status: AC

## 2021-11-03 NOTE — ED Provider Notes (Signed)
[facility]    CSN: [id number] Arrival date & time: 11/03/21  1918      History   Chief Complaint Chief Complaint  Patient presents with   Dental Pain    HPI Kayla Caldwell is a 46 y.o. female presenting for continued pain of left lower molars.  Patient says 2 weeks ago the tooth in the back broke and over the past couple days she started to experience increased swelling and pain.  She says she had similar symptoms occur last month and had a tooth extracted by her dentist.  She cannot see her dentist for another 1 week.  She has been taking over-the-counter medication for pain relief but says it has not helped.  She denies any associated fevers or significant facial swelling or headaches.  HPI  Past Medical History:  Diagnosis Date   COPD (chronic obstructive pulmonary disease) ([place])    Diabetes ([place])    Heart disease    Heart failure ([place])    Hyperlipidemia    Hypertension    MI (myocardial infarction) [facility])     Patient Active Problem List   Diagnosis Date Noted   Left shoulder pain 06/12/2021   History of CVA (cerebrovascular accident) 06/11/2021   Hypertensive urgency 06/11/2021   AKI (acute kidney injury) ([place]) 06/11/2021   Unstable angina ([place]) 06/11/2021   Chest pain 04/11/2021   HLD (hyperlipidemia) 04/11/2021   Diabetes mellitus without complication ([place]) 78/29/5621   COPD (chronic obstructive pulmonary disease) ([place]) 04/11/2021   CAD (coronary artery disease) 04/11/2021   Hypokalemia 04/11/2021   Obesity, Class III, BMI 40-49.9 (morbid obesity) ([place]) 04/11/2021   Chronic diastolic CHF (congestive heart failure) ([place]) 04/11/2021   Positive D-dimer    Class 3 severe obesity due to excess calories without serious comorbidity with body mass index (BMI) of 40.0 to 44.9 in adult ([place]) 10/27/2020   Atelectasis, left    Lobar pneumonia ([place]) 10/11/2020   CAD (coronary artery disease), native coronary artery 10/11/2020   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      Home Medications    Prior to Admission medications   Medication Sig Start Date End Date Taking? Authorizing Provider  albuterol (VENTOLIN HFA) 108 (90 Base) MCG/ACT inhaler Inhale 2 puffs into the lungs every 6 (six) hours as needed for wheezing or shortness of breath. 03/19/21  Yes Vanessa [place], MD  amLODipine (NORVASC) 10 MG tablet Take 1 tablet (10 mg total) by mouth daily. 10/27/20  Yes Cletis [place], MD  amoxicillin-clavulanate (AUGMENTIN) 875-125 MG tablet Take 1 tablet by mouth every 12 (twelve) hours for 7 days. 11/03/21 11/10/21 Yes Danton Clap, PA-C  aspirin EC 81 MG EC tablet Take 1 tablet (81 mg total) by mouth daily. Swallow whole. 04/13/21  Yes Fritzi Mandes, MD  atorvastatin (LIPITOR) 80 MG tablet Take 1 tablet (80 mg total) by mouth daily. 10/27/20  Yes Masoud, Viann Shove, MD  Bacillus Coagulans-Inulin (PROBIOTIC) 1-250 BILLION-MG CAPS Take 1 capsule by mouth daily. 09/28/21  Yes Carrie Mew, MD  bumetanide (BUMEX) 2 MG tablet Take 2 mg by mouth daily.   Yes [provider]  carvedilol (COREG) 25 MG tablet  Take 1 tablet (25 mg total) by mouth 2 (two) times daily with a meal. 10/27/20  Yes Masoud, Viann Shove, MD  clopidogrel (PLAVIX) 75 MG tablet Take 75 mg by mouth daily.   Yes [provider]  Continuous Blood Gluc Sensor (FREESTYLE LIBRE 14 DAY SENSOR) MISC 1 each by Does not apply route every 14 (fourteen) days. 11/30/20  Yes Masoud, Viann Shove, MD  ezetimibe (ZETIA) 10 MG tablet Take 1 tablet (10 mg total) by mouth daily. 10/27/20  Yes Masoud, Viann Shove, MD  HYDROcodone-acetaminophen (NORCO/VICODIN) 5-325 MG tablet Take 1 tablet by mouth every 6 (six) hours as needed for up to 3 days. 11/03/21 11/06/21 Yes Danton Clap,  PA-C  hydrOXYzine (ATARAX) 25 MG tablet Take 12.5 mg by mouth at bedtime as needed.   Yes [provider]  insulin glargine (LANTUS) 100 UNIT/ML injection Inject 5 Units into the skin at bedtime.   Yes [provider]  insulin lispro (HUMALOG) 100 UNIT/ML injection Inject 2 Units into the skin 3 (three) times daily before meals.   Yes [provider]  lidocaine (LIDODERM) 5 % Place 1 patch onto the skin daily. Remove & Discard patch within 12 hours or as directed by MD 06/12/21  Yes Enzo Bi, MD  magnesium oxide (MAG-OX) 400 MG tablet Take 400 mg by mouth daily.   Yes [provider]  ondansetron (ZOFRAN-ODT) 4 MG disintegrating tablet Take 1 tablet (4 mg total) by mouth every 6 (six) hours as needed for nausea or vomiting. 09/28/21  Yes Carrie Mew, MD  pantoprazole (PROTONIX) 40 MG tablet Take 1 tablet (40 mg total) by mouth 2 (two) times daily as needed. Home med. 06/12/21  Yes Enzo Bi, MD  penicillin v potassium (VEETID) 500 MG tablet Take 1 tablet (500 mg total) by mouth 4 (four) times daily. 09/28/21  Yes Carrie Mew, MD  potassium chloride SA (KLOR-CON M) 20 MEQ tablet TAKE 1 TABLET BY MOUTH DAILY 03/12/21  Yes Cletis [place], MD  promethazine (PHENERGAN) 12.5 MG tablet Take 1 tablet (12.5 mg total) by mouth every 12 (twelve) hours as needed for nausea or vomiting. 04/12/21  Yes Fritzi Mandes, MD  ranolazine (RANEXA) 1000 MG SR tablet Take 1 tablet (1,000 mg total) by mouth 2 (two) times daily. 10/27/20  Yes Masoud, Viann Shove, MD  senna (SENOKOT) 8.6 MG TABS tablet Take 1 tablet (8.6 mg total) by mouth at bedtime as needed. 06/12/21   Enzo Bi, MD    Family History Family History  Problem Relation Age of Onset   Hypertension Mother    Heart disease Mother    Depression Father    Hypertension Father    Heart disease Father    Breast cancer Maternal Aunt    Cancer Maternal Aunt     Social History Social History   Tobacco Use   Smoking status:  Never   Smokeless tobacco: Never  Vaping Use   Vaping Use: Never used  Substance Use Topics   Alcohol use: Never   Drug use: Never     Allergies   Compazine [prochlorperazine], Hydralazine, Lisinopril, Metoclopramide, Naproxen, Nitroglycerin, Nsaids, Tape, Toradol [ketorolac tromethamine], and Tramadol   Review of Systems Review of Systems  Constitutional:  Negative for fatigue and fever.  HENT:  Positive for dental problem and facial swelling.   Neurological:  Negative for weakness.  Hematological:  Negative for adenopathy.     Physical Exam Triage Vital Signs ED Triage Vitals  Enc Vitals Group     BP 11/03/21  1942 (!) 170/97     Pulse Rate 11/03/21 1942 74     Resp --      Temp 11/03/21 1942 98.1 F (36.7 C)     Temp Source 11/03/21 1942 Oral     SpO2 11/03/21 1942 100 %     Weight 11/03/21 1940 242 lb (109.8 kg)     Height 11/03/21 1940 '5\' 4"'$  (1.626 m)     Head Circumference --      Peak Flow --      Pain Score 11/03/21 1940 10     Pain Loc --      Pain Edu? --      Excl. in [place]? --    No data found.  Updated Vital Signs BP (!) 170/97 (BP Location: Right Arm)   Pulse 74   Temp 98.1 F (36.7 C) (Oral)   Ht '5\' 4"'$  (1.626 m)   Wt 242 lb (109.8 kg)   SpO2 100%   BMI 41.54 kg/m      Physical Exam Vitals and nursing note reviewed.  Constitutional:      General: She is not in acute distress.    Appearance: Normal appearance. She is not ill-appearing or toxic-appearing.  HENT:     Head: Normocephalic and atraumatic.     Nose: Nose normal.     Mouth/Throat:     Mouth: Mucous membranes are moist.     Dentition: Dental tenderness present.     Pharynx: Oropharynx is clear.      Comments: The second lower molar on the left side is cracked and broken and there is surrounding mild erythema and swelling as well as tenderness. Eyes:     General: No scleral icterus.       Right eye: No discharge.        Left eye: No discharge.     Conjunctiva/sclera:  Conjunctivae normal.  Cardiovascular:     Rate and Rhythm: Normal rate and regular rhythm.     Heart sounds: Normal heart sounds.  Pulmonary:     Effort: Pulmonary effort is normal. No respiratory distress.  Musculoskeletal:     Cervical back: Neck supple.  Skin:    General: Skin is dry.  Neurological:     General: No focal deficit present.     Mental Status: She is alert. Mental status is at baseline.     Motor: No weakness.     Gait: Gait normal.  Psychiatric:        Mood and Affect: Mood normal.        Behavior: Behavior normal.        Thought Content: Thought content normal.      UC Treatments / Results  Labs (all labs ordered are listed, but only abnormal results are displayed) Labs Reviewed - No data to display  EKG   Radiology No results found.  Procedures Procedures (including critical care time)  Medications Ordered in UC Medications - No data to display  Initial Impression / Assessment and Plan / UC Course  I have reviewed the triage vital signs and the nursing notes.  Pertinent labs & imaging results that were available during my care of the patient were reviewed by me and considered in my medical decision making (see chart for details).   46 y/o female presenting for pain of left lower molar for the past couple days.  Tooth broke 2 weeks ago.  Has dentist appointment in 1 week.  She is afebrile and overall well-appearing but  does appear to be uncomfortable and in pain.  She does have a cracked second left lower molar with surrounding erythema and swelling that is mild.  No draining abscess or obvious abscess.  We will treat this time for suspected dental infection with Augmentin.  Provided short supply of Norco after reviewing controlled substance database.  Advised her to keep the appoint with her dentist to go to emergency department if any sudden or severe acute worsening of symptoms.   Final Clinical Impressions(s) / UC Diagnoses   Final diagnoses:   Pain, dental  Dental infection     Discharge Instructions      -I have sent antibiotics and something for pain.  Follow-up with your dentist. - Go to emergency department if your symptoms acutely worsen or you develop fever or increased facial swelling.     ED Prescriptions     Medication Sig Dispense Auth. Provider   amoxicillin-clavulanate (AUGMENTIN) 875-125 MG tablet Take 1 tablet by mouth every 12 (twelve) hours for 7 days. 14 tablet Laurene Footman B, PA-C   HYDROcodone-acetaminophen (NORCO/VICODIN) 5-325 MG tablet Take 1 tablet by mouth every 6 (six) hours as needed for up to 3 days. 9 tablet Danton Clap, PA-C      I have reviewed the PDMP during this encounter.   Danton Clap, PA-C 11/03/21 2006

## 2021-11-03 NOTE — ED Triage Notes (Addendum)
Pt c/o lower side dental pain,pt states she cracked tooth x2 weeks ago, pain onset yesterday. Pt does states she does have an appt scheduled w/ dentist next Wednesday 11/10/21

## 2021-11-03 NOTE — Discharge Instructions (Signed)
-  I have sent antibiotics and something for pain.  Follow-up with your dentist. - Go to emergency department if your symptoms acutely worsen or you develop fever or increased facial swelling.

## 2021-11-19 ENCOUNTER — Encounter: Payer: Self-pay | Admitting: Gastroenterology

## 2021-11-22 ENCOUNTER — Ambulatory Visit: Admission: RE | Admit: 2021-11-22 | Payer: Medicare Other | Source: Home / Self Care | Admitting: Gastroenterology

## 2021-11-22 ENCOUNTER — Encounter: Admission: RE | Payer: Self-pay | Source: Home / Self Care

## 2021-11-22 HISTORY — DX: Heart failure, unspecified: I50.9

## 2021-11-22 SURGERY — COLONOSCOPY
Anesthesia: General

## 2021-12-03 ENCOUNTER — Other Ambulatory Visit: Payer: Self-pay | Admitting: Internal Medicine

## 2021-12-21 ENCOUNTER — Emergency Department
Admission: EM | Admit: 2021-12-21 | Discharge: 2021-12-21 | Disposition: A | Discharge status: Home / Self Care | Payer: Medicare Other

## 2021-12-21 NOTE — ED Notes (Addendum)
First Nurse Note:  Called pt, states that she has been in contact with her OBGYN and no longer wants to be seen.

## 2022-01-22 ENCOUNTER — Other Ambulatory Visit: Payer: Self-pay

## 2022-01-22 ENCOUNTER — Emergency Department: Payer: Medicare Other

## 2022-01-22 ENCOUNTER — Emergency Department
Admission: EM | Admit: 2022-01-22 | Discharge: 2022-01-22 | Disposition: A | Discharge status: Home / Self Care | Payer: Medicare Other | Attending: Student in an Organized Health Care Education/Training Program | Admitting: Student in an Organized Health Care Education/Training Program

## 2022-01-22 DIAGNOSIS — Z951 Presence of aortocoronary bypass graft: Secondary | ICD-10-CM | POA: Insufficient documentation

## 2022-01-22 DIAGNOSIS — R0789 Other chest pain: Secondary | ICD-10-CM

## 2022-01-22 DIAGNOSIS — I10 Essential (primary) hypertension: Secondary | ICD-10-CM | POA: Insufficient documentation

## 2022-01-22 DIAGNOSIS — R079 Chest pain, unspecified: Secondary | ICD-10-CM | POA: Diagnosis present

## 2022-01-22 LAB — CBC
HCT: 37.5 % (ref 36.0–46.0)
Hemoglobin: 12.8 g/dL (ref 12.0–15.0)
MCH: 29.3 pg (ref 26.0–34.0)
MCHC: 34.1 g/dL (ref 30.0–36.0)
MCV: 85.8 fL (ref 80.0–100.0)
Platelets: 269 10*3/uL (ref 150–400)
RBC: 4.37 MIL/uL (ref 3.87–5.11)
RDW: 13.2 % (ref 11.5–15.5)
WBC: 5.7 10*3/uL (ref 4.0–10.5)
nRBC: 0 % (ref 0.0–0.2)

## 2022-01-22 LAB — BASIC METABOLIC PANEL
Anion gap: 10 (ref 5–15)
BUN: 11 mg/dL (ref 6–20)
CO2: 22 mmol/L (ref 22–32)
Calcium: 9.7 mg/dL (ref 8.9–10.3)
Chloride: 108 mmol/L (ref 98–111)
Creatinine, Ser: 0.67 mg/dL (ref 0.44–1.00)
GFR, Estimated: >60 mL/min (ref >60)
Glucose, Bld: 145 mg/dL — ABNORMAL HIGH (ref 70–99)
Potassium: 3.6 mmol/L (ref 3.5–5.1)
Sodium: 140 mmol/L (ref 135–145)

## 2022-01-22 LAB — TROPONIN I (HIGH SENSITIVITY)
Troponin I (High Sensitivity): 19 ng/L — ABNORMAL HIGH (ref <18)
Troponin I (High Sensitivity): 18 ng/L — ABNORMAL HIGH (ref <18)

## 2022-01-22 MED ORDER — MORPHINE SULFATE (PF) 4 MG/ML IV SOLN
4.0000 mg | INTRAVENOUS | Status: DC | PRN
  Administered 2022-01-22: 4 mg via INTRAVENOUS
  Filled 2022-01-22: qty 1

## 2022-01-22 MED ORDER — ONDANSETRON HCL 4 MG/2ML IJ SOLN
4.0000 mg | Freq: Once | INTRAMUSCULAR | Status: AC
  Administered 2022-01-22: 4 mg via INTRAVENOUS
  Filled 2022-01-22: qty 2

## 2022-01-22 MED ORDER — LABETALOL HCL 5 MG/ML IV SOLN
10.0000 mg | Freq: Once | INTRAVENOUS | Status: DC
  Filled 2022-01-22: qty 4

## 2022-01-22 MED ORDER — IOHEXOL 350 MG/ML SOLN
75.0000 mL | Freq: Once | INTRAVENOUS | Status: AC | PRN
  Administered 2022-01-22: 75 mL via INTRAVENOUS

## 2022-01-22 MED ORDER — LABETALOL HCL 5 MG/ML IV SOLN
10.0000 mg | Freq: Once | INTRAVENOUS | Status: AC
  Administered 2022-01-22: 10 mg via INTRAVENOUS
  Filled 2022-01-22: qty 4

## 2022-01-22 MED ORDER — ALUM & MAG HYDROXIDE-SIMETH 200-200-20 MG/5ML PO SUSP
30.0000 mL | Freq: Once | ORAL | Status: DC
  Filled 2022-01-22: qty 30

## 2022-01-22 NOTE — ED Provider Triage Note (Signed)
Emergency Medicine Provider Triage Evaluation Note  Kayla Caldwell , a 46 y.o. female  was evaluated in triage.  Pt complains of left sided chest pain with radiation into left arm. Started last pm.  Still taking plavix.   Review of Systems  Positive: + cardiac history,  CABG last year Negative: No recent chest injury  Physical Exam  Ht '5\' 3"'$  (1.6 m)   Wt 107.5 kg   BMI 41.98 kg/m  Gen:   Awake, no distress   Resp:  Normal effort , no wheezing  MSK:   Moves extremities without difficulty  Other:    Medical Decision Making  Medically screening exam initiated at 8:01 AM.  Appropriate orders placed.  Kayla Caldwell was informed that the remainder of the evaluation will be completed by another provider, this initial triage assessment does not replace that evaluation, and the importance of remaining in the ED until their evaluation is complete.     Johnn Hai, PA-C 01/22/22 [phone]

## 2022-01-22 NOTE — ED Provider Notes (Signed)
[facility] Provider Note    Event Date/Time   First MD Initiated Contact with Patient 01/22/22 [phone]     (approximate)   History   Chest Pain   HPI  Kayla Caldwell is a 46 y.o. female who presents to the ER for evaluation of chest pain that woke her from sleep.  It is radiating into her back and flank.  Has not had pain like this before.  No diaphoresis.  She denies any shortness of breath.  No nausea or vomiting no abdominal pain.  Does have a history of post CABG but this feels different from previous chest pain.     Physical Exam   Triage Vital Signs: ED Triage Vitals  Enc Vitals Group     BP 01/22/22 0802 (!) 191/105     Pulse Rate 01/22/22 0802 73     Resp 01/22/22 0802 16     Temp 01/22/22 0802 97.9 F (36.6 C)     Temp src --      SpO2 01/22/22 0802 100 %     Weight 01/22/22 0757 237 lb (107.5 kg)     Height 01/22/22 0757 '5\' 3"'$  (1.6 m)     Head Circumference --      Peak Flow --      Pain Score 01/22/22 0757 8     Pain Loc --      Pain Edu? --      Excl. in [place]? --     Most recent vital signs: Vitals:   01/22/22 1254 01/22/22 1330  BP: (!) 189/89 (!) 183/79  Pulse: 65 (!) 58  Resp: 18 19  Temp:    SpO2: 100% 100%     Constitutional: Alert  Eyes: Conjunctivae are normal.  Head: Atraumatic. Nose: No congestion/rhinnorhea. Mouth/Throat: Mucous membranes are moist.   Neck: Painless ROM.  Cardiovascular:   Good peripheral circulation. Respiratory: Normal respiratory effort.  No retractions.  Gastrointestinal: Soft and nontender.  Musculoskeletal:  no deformity Neurologic:  MAE spontaneously. No gross focal neurologic deficits are appreciated.  Skin:  Skin is warm, dry and intact. No rash noted. Psychiatric: Mood and affect are normal. Speech and behavior are normal.    ED Results / Procedures / Treatments   Labs (all labs ordered are listed, but only abnormal results are displayed) Labs Reviewed  BASIC  METABOLIC PANEL - Abnormal; Notable for the following components:      Result Value   Glucose, Bld 145 (*)    All other components within normal limits  TROPONIN I (HIGH SENSITIVITY) - Abnormal; Notable for the following components:   Troponin I (High Sensitivity) 19 (*)    All other components within normal limits  TROPONIN I (HIGH SENSITIVITY) - Abnormal; Notable for the following components:   Troponin I (High Sensitivity) 18 (*)    All other components within normal limits  CBC  POC URINE PREG, ED     EKG  ED ECG REPORT I, Merlyn Lot, the attending physician, personally viewed and interpreted this ECG.   Date: 01/22/2022  EKG Time: 7:59  Rate: 80  Rhythm: sinus  Axis: normal  Intervals: normal  ST&T Change: anterior t wave inversions, no stemi, no significant change from previous    RADIOLOGY Please see ED Course for my review and interpretation.  I personally reviewed all radiographic images ordered to evaluate for the above acute complaints and reviewed radiology reports and findings.  These findings were personally discussed with the patient.  Please see medical record for radiology report.    PROCEDURES:  Critical Care performed: No  Procedures   MEDICATIONS ORDERED IN ED: Medications  morphine (PF) 4 MG/ML injection 4 mg (4 mg Intravenous Given 01/22/22 1016)  alum & mag hydroxide-simeth (MAALOX/MYLANTA) 200-200-20 MG/5ML suspension 30 mL (30 mLs Oral Not Given 01/22/22 1412)  labetalol (NORMODYNE) injection 10 mg (10 mg Intravenous Not Given 01/22/22 1412)  labetalol (NORMODYNE) injection 10 mg (10 mg Intravenous Given 01/22/22 1015)  ondansetron (ZOFRAN) injection 4 mg (4 mg Intravenous Given 01/22/22 1015)  iohexol (OMNIPAQUE) 350 MG/ML injection 75 mL (75 mLs Intravenous Contrast Given 01/22/22 1103)  ondansetron (ZOFRAN) injection 4 mg (4 mg Intravenous Given 01/22/22 1315)     IMPRESSION / MDM / ASSESSMENT AND PLAN / ED COURSE  I reviewed  the triage vital signs and the nursing notes.                              Differential diagnosis includes, but is not limited to, ACS, pericarditis, esophagitis, boerhaaves, pe, dissection, pna, bronchitis, costochondritis  Patient presenting to the ER for evaluation of symptoms as described above.  Based on symptoms, risk factors and considered above differential, this presenting complaint could reflect a potentially life-threatening illness therefore the patient will be placed on continuous pulse oximetry and telemetry for monitoring.  Laboratory evaluation will be sent to evaluate for the above complaints.    Will order IV labetalol for htn,  will give IV morphine for pain.  Will order cta.   Clinical Course as of 01/22/22 1423  Sat Jan 22, 2022  1003 Chest x-ray on my review and interpretation does not show any evidence of pneumothorax or consolidation. [PR]  9381 CTA on my  review and interpretation does not show any evidence of dissection. [PR]  1410 CT Angio Chest Aorta W and/or Wo Contrast [PR]  8299 Patient reassessed.  She is completely pain-free.  Discussed my concern over her elevated blood pressure.  States that she does not want to be admitted to the hospital would prefer to follow-up as an outpatient will go home and take her home antihypertensive medications.  She denies any nausea no abdominal pain.  Feels like she is at her baseline.  Given her otherwise reassuring blood work and imaging findings here I think that is reasonable. [PR]    Clinical Course User Index [PR] Merlyn Lot, MD     FINAL CLINICAL IMPRESSION(S) / ED DIAGNOSES   Final diagnoses:  Atypical chest pain  Uncontrolled hypertension     Rx / DC Orders   ED Discharge Orders     None        Note:  This document was prepared using Dragon voice recognition software and may include unintentional dictation errors.    Merlyn Lot, MD 01/22/22 [phone]

## 2022-01-22 NOTE — ED Triage Notes (Signed)
Pt presents via POV c/o left sided chest pain into the left arm since last PM. Reports hx of CABG last year. Reports pain in arm is intermittent however chest pain has been persistent.

## 2022-01-24 ENCOUNTER — Ambulatory Visit: Admit: 2022-01-24 | Discharge: 2022-01-24 | Disposition: A | Discharge status: Home / Self Care | Payer: MEDICARE

## 2022-01-24 ENCOUNTER — Emergency Department: Admit: 2022-01-24 | Discharge: 2022-01-24 | Disposition: A | Discharge status: Home / Self Care | Payer: MEDICARE

## 2022-01-24 DIAGNOSIS — N12 Tubulo-interstitial nephritis, not specified as acute or chronic: Principal | ICD-10-CM

## 2022-01-24 DIAGNOSIS — R109 Unspecified abdominal pain: Principal | ICD-10-CM

## 2022-01-24 MED ORDER — CEFDINIR 300 MG CAPSULE
ORAL_CAPSULE | Freq: Two times a day (BID) | ORAL | 0 refills | 10 days | Status: CP
Start: 2022-01-24 — End: 2022-02-03

## 2022-03-27 ENCOUNTER — Other Ambulatory Visit: Payer: Self-pay

## 2022-03-27 ENCOUNTER — Emergency Department: Payer: 59

## 2022-03-27 ENCOUNTER — Emergency Department
Admission: EM | Admit: 2022-03-27 | Discharge: 2022-03-27 | Disposition: A | Discharge status: Home / Self Care | Payer: 59 | Attending: Emergency Medicine | Admitting: Emergency Medicine

## 2022-03-27 DIAGNOSIS — J40 Bronchitis, not specified as acute or chronic: Secondary | ICD-10-CM | POA: Diagnosis not present

## 2022-03-27 DIAGNOSIS — R079 Chest pain, unspecified: Secondary | ICD-10-CM

## 2022-03-27 DIAGNOSIS — Z951 Presence of aortocoronary bypass graft: Secondary | ICD-10-CM | POA: Diagnosis not present

## 2022-03-27 DIAGNOSIS — R0789 Other chest pain: Secondary | ICD-10-CM | POA: Diagnosis present

## 2022-03-27 LAB — CBC
HCT: 37.9 % (ref 36.0–46.0)
Hemoglobin: 12.5 g/dL (ref 12.0–15.0)
MCH: 29 pg (ref 26.0–34.0)
MCHC: 33 g/dL (ref 30.0–36.0)
MCV: 87.9 fL (ref 80.0–100.0)
Platelets: 240 10*3/uL (ref 150–400)
RBC: 4.31 MIL/uL (ref 3.87–5.11)
RDW: 14.1 % (ref 11.5–15.5)
WBC: 6.4 10*3/uL (ref 4.0–10.5)
nRBC: 0 % (ref 0.0–0.2)

## 2022-03-27 LAB — BASIC METABOLIC PANEL
Anion gap: 6 (ref 5–15)
BUN: 14 mg/dL (ref 6–20)
CO2: 25 mmol/L (ref 22–32)
Calcium: 9.1 mg/dL (ref 8.9–10.3)
Chloride: 107 mmol/L (ref 98–111)
Creatinine, Ser: 0.94 mg/dL (ref 0.44–1.00)
GFR, Estimated: >60 mL/min (ref >60)
Glucose, Bld: 158 mg/dL — ABNORMAL HIGH (ref 70–99)
Potassium: 3.7 mmol/L (ref 3.5–5.1)
Sodium: 138 mmol/L (ref 135–145)

## 2022-03-27 LAB — TROPONIN I (HIGH SENSITIVITY)
Troponin I (High Sensitivity): 12 ng/L (ref <18)
Troponin I (High Sensitivity): 13 ng/L (ref <18)

## 2022-03-27 MED ORDER — MORPHINE SULFATE (PF) 4 MG/ML IV SOLN
4.0000 mg | Freq: Once | INTRAVENOUS | Status: AC
  Administered 2022-03-27: 4 mg via INTRAVENOUS
  Filled 2022-03-27: qty 1

## 2022-03-27 MED ORDER — IOHEXOL 350 MG/ML SOLN
100.0000 mL | Freq: Once | INTRAVENOUS | Status: AC | PRN
  Administered 2022-03-27: 100 mL via INTRAVENOUS

## 2022-03-27 MED ORDER — ONDANSETRON HCL 4 MG/2ML IJ SOLN
4.0000 mg | Freq: Once | INTRAMUSCULAR | Status: AC
  Administered 2022-03-27: 4 mg via INTRAVENOUS
  Filled 2022-03-27: qty 2

## 2022-03-27 MED ORDER — FENTANYL CITRATE PF 50 MCG/ML IJ SOSY
50.0000 ug | PREFILLED_SYRINGE | Freq: Once | INTRAMUSCULAR | Status: AC
  Administered 2022-03-27: 50 ug via INTRAVENOUS
  Filled 2022-03-27: qty 1

## 2022-03-27 MED ORDER — PREDNISONE 50 MG PO TABS: 50.0000 mg | ORAL_TABLET | Freq: Every day | ORAL | 0 refills | Status: DC

## 2022-03-27 NOTE — ED Provider Notes (Signed)
[facility] Provider Note  Patient Contact: 10:49 PM (approximate)   History   Chest Pain   HPI  Kayla Caldwell is a 47 y.o. female who presents emergency department planing of right-sided chest pain.  Patient has a history of a CABG, having had a heart attack last year.  Patient had this performed in [place].  Patient is complaining of right-sided chest pain that is sharp, pleuritic in nature.  No emesis, nausea, GI complaints.  Patient has no shortness of breath.  Again right-sided chest pain.  No history of DVT or PE.     Physical Exam   Triage Vital Signs: ED Triage Vitals  Enc Vitals Group     BP 03/27/22 1731 (!) 154/103     Pulse Rate 03/27/22 1731 (!) 55     Resp 03/27/22 1731 20     Temp 03/27/22 1731 98.2 F (36.8 C)     Temp Source 03/27/22 1731 Oral     SpO2 03/27/22 1731 98 %     Weight 03/27/22 1728 238 lb (108 kg)     Height 03/27/22 1728 '5\' 3"'$  (1.6 m)     Head Circumference --      Peak Flow --      Pain Score 03/27/22 1728 9     Pain Loc --      Pain Edu? --      Excl. in [place]? --     Most recent vital signs: Vitals:   03/27/22 1731 03/27/22 1946  BP: (!) 154/103 (!) 152/103  Pulse: (!) 55 68  Resp: 20 (!) 22  Temp: 98.2 F (36.8 C)   SpO2: 98% 100%     General: Alert and in no acute distress.  Cardiovascular:  Good peripheral perfusion Respiratory: Normal respiratory effort without tachypnea or retractions. Lungs CTAB. Good air entry to the bases with no decreased or absent breath sounds Gastrointestinal: Bowel sounds 4 quadrants. Soft and nontender to palpation. No guarding or rigidity. No palpable masses. No distention. No CVA tenderness. Musculoskeletal: Full range of motion to all extremities.  Neurologic:  No gross focal neurologic deficits are appreciated.  Skin:   No rash noted Other:   ED Results / Procedures / Treatments   Labs (all labs ordered are listed, but only abnormal results are  displayed) Labs Reviewed  BASIC METABOLIC PANEL - Abnormal; Notable for the following components:      Result Value   Glucose, Bld 158 (*)    All other components within normal limits  CBC  POC URINE PREG, ED  TROPONIN I (HIGH SENSITIVITY)  TROPONIN I (HIGH SENSITIVITY)     EKG  ED ECG REPORT I, Charline Bills Taleigh Gero,  personally viewed and interpreted this ECG.   Date: 03/27/2022  EKG Time: 1727 hrs.  Rate: 76 bpm  Rhythm: normal sinus rhythm, compared to previous EKG from 01/22/2022 no significant change  Axis: Normal axis  Intervals:none  ST&T Change: No ST elevation or depression noted  No STEMI.  Normal sinus rhythm.   RADIOLOGY  I personally viewed, evaluated, and interpreted these images as part of my medical decision making, as well as reviewing the written report by the radiologist.  ED Provider Interpretation: Bronchial thickening, no evidence of PE.  No evidence of consolidation concerning for pneumonia.  CABG changes identified.  CT Angio Chest PE W and/or Wo Contrast  Result Date: 03/27/2022 CLINICAL DATA:  Chest pain. Pulmonary embolism suspected. High probability. Complains of stabbing right chest  pain. CABG reportedly 1 year ago. EXAM: CT ANGIOGRAPHY CHEST WITH CONTRAST TECHNIQUE: Multidetector CT imaging of the chest was performed using the standard protocol during bolus administration of intravenous contrast. Multiplanar CT image reconstructions and MIPs were obtained to evaluate the vascular anatomy. RADIATION DOSE REDUCTION: This exam was performed according to the departmental dose-optimization program which includes automated exposure control, adjustment of the mA and/or kV according to patient size and/or use of iterative reconstruction technique. CONTRAST:  143m OMNIPAQUE IOHEXOL 350 MG/ML SOLN COMPARISON:  PA Lat chest today, PA Lat 01/22/2022, CTA chest studies from 01/22/2022 and 04/11/2021. FINDINGS: Cardiovascular: CABG changes are again noted with  well-healed median sternotomy. Native coronary arteries are heavily calcified for age. There is no significant pericardial effusion. There is mild cardiomegaly with a left chamber predominance, slightly prominent superior pulmonary veins. Normal variant bronchiobicarotid trunk. No aortic aneurysm, dissection or stenosis. There is early takeoff of the left vertebral artery from the left subclavian. Pulmonary arteries are normal in caliber without evidence of thromboemboli. Mediastinum/Nodes: No enlarged mediastinal, hilar, or axillary lymph nodes. Thyroid gland, trachea, and esophagus demonstrate no significant findings. Lungs/Pleura: Mild diffuse bronchial thickening. No pleural effusion, thickening or pneumothorax. The lungs are clear. Upper Abdomen: No acute findings. There are calcific plaques in the SMA and renal arteries. Musculoskeletal: Mild thoracic spondylosis and degenerative disc disease. No concerning regional bone lesions. No suspicious findings of the visualized chest wall. Review of the MIP images confirms the above findings. IMPRESSION: 1. No evidence of arterial dilatation or embolus. 2. Cardiomegaly with slightly prominent superior pulmonary veins. No edema or pleural effusion. 3. CABG changes. 4. Mild diffuse bronchial thickening. No consolidation. 5. Calcific plaques in the SMA and renal arteries. Electronically Signed   By: KTelford NabM.D.   On: 03/27/2022 21:31   DG Chest 2 View  Result Date: 03/27/2022 CLINICAL DATA:  Chest pain EXAM: CHEST - 2 VIEW COMPARISON:  Chest x-ray January 22, 2022 FINDINGS: The heart size and mediastinal contours are within normal limits. Both lungs are clear. The visualized skeletal structures are unremarkable. IMPRESSION: No active cardiopulmonary disease. Electronically Signed   By: DDorise BullionIII M.D.   On: 03/27/2022 17:55    PROCEDURES:  Critical Care performed: No  Procedures   MEDICATIONS ORDERED IN ED: Medications  morphine (PF) 4  MG/ML injection 4 mg (4 mg Intravenous Given 03/27/22 2028)  ondansetron (ZOFRAN) injection 4 mg (4 mg Intravenous Given 03/27/22 2028)  iohexol (OMNIPAQUE) 350 MG/ML injection 100 mL (100 mLs Intravenous Contrast Given 03/27/22 2103)  fentaNYL (SUBLIMAZE) injection 50 mcg (50 mcg Intravenous Given 03/27/22 2125)     IMPRESSION / MDM / ASSESSMENT AND PLAN / ED COURSE  I reviewed the triage vital signs and the nursing notes.                                 Differential diagnosis includes, but is not limited to, nonspecific chest pain, STEMI, NSTEMI, bronchitis, pneumonia, costochondritis  Patient's presentation is most consistent with acute presentation with potential threat to life or bodily function.   Patient's diagnosis is consistent with nonspecific chest pain, bronchitis.  Patient presents emergency department complaining of right-sided chest pain.  History of CABG and previous STEMI.  Troponins are reassuring, in fact are lower than typical for the patient.  EKG is reassuring with normal sinus rhythm with no evidence of STEMI.  Patient's labs are otherwise reassuring.  Given  the reported pain, I did evaluate the patient with CT scan which revealed no evidence of PE.  Peribronchial thickening consistent with bronchitis.  Patient has received several rounds of pain medication and is currently asymptomatic.  Will treat with a short round of steroid for the patient.  Concerning signs and symptoms and return precaution discussed with patient.  She should follow-up with her cardiologist..  Patient is given ED precautions to return to the ED for any worsening or new symptoms.     FINAL CLINICAL IMPRESSION(S) / ED DIAGNOSES   Final diagnoses:  Nonspecific chest pain  Bronchitis     Rx / DC Orders   ED Discharge Orders     None        Note:  This document was prepared using Dragon voice recognition software and may include unintentional dictation errors.   Brynda Peon 03/27/22 2254    Rada Hay, MD 03/28/22 1740

## 2022-03-27 NOTE — ED Triage Notes (Signed)
Pt to ED for sharp stabbing chest pain to R chest and under R breast. Hx CABG 1 year ago. States pain takes her breath away. NSR on EKG, handed to provider. Pt sees Dr Dema Severin cardiologist, takes plavix. Also took '324mg'$  aspirin this AM. States pain has gotten worse throughout the day.

## 2022-03-27 NOTE — ED Notes (Signed)
See triage note. Pt states pain woke her up this morning and has been constant ever since. Pt states pain worse with deep breath denies any recent medication changes or changes in activity. Pt A&Ox4, appears to be in significant pain, ambulatory. Provider at bedside.

## 2022-03-31 ENCOUNTER — Other Ambulatory Visit: Payer: Self-pay | Admitting: Orthopedic Surgery

## 2022-03-31 DIAGNOSIS — M25412 Effusion, left shoulder: Secondary | ICD-10-CM

## 2022-03-31 DIAGNOSIS — R2231 Localized swelling, mass and lump, right upper limb: Secondary | ICD-10-CM

## 2022-04-06 ENCOUNTER — Other Ambulatory Visit: Payer: Self-pay | Admitting: Family Medicine

## 2022-04-06 DIAGNOSIS — Z1231 Encounter for screening mammogram for malignant neoplasm of breast: Secondary | ICD-10-CM

## 2022-04-23 ENCOUNTER — Ambulatory Visit
Admission: EM | Admit: 2022-04-23 | Discharge: 2022-04-23 | Disposition: A | Discharge status: Home / Self Care | Payer: 59 | Attending: Family Medicine | Admitting: Family Medicine

## 2022-04-23 DIAGNOSIS — B9689 Other specified bacterial agents as the cause of diseases classified elsewhere: Secondary | ICD-10-CM | POA: Diagnosis present

## 2022-04-23 DIAGNOSIS — N760 Acute vaginitis: Secondary | ICD-10-CM | POA: Diagnosis present

## 2022-04-23 DIAGNOSIS — A5901 Trichomonal vulvovaginitis: Secondary | ICD-10-CM

## 2022-04-23 LAB — WET PREP, GENITAL
Sperm: NONE SEEN
WBC, Wet Prep HPF POC: <10 — AB (ref <10)
Yeast Wet Prep HPF POC: NONE SEEN

## 2022-04-23 LAB — URINALYSIS, W/ REFLEX TO CULTURE (INFECTION SUSPECTED)
Bilirubin Urine: NEGATIVE
Glucose, UA: NEGATIVE mg/dL
Hgb urine dipstick: NEGATIVE
Ketones, ur: NEGATIVE mg/dL
Nitrite: NEGATIVE
Protein, ur: NEGATIVE mg/dL
Specific Gravity, Urine: 1.015 (ref 1.005–1.030)
pH: 7.5 (ref 5.0–8.0)

## 2022-04-23 MED ORDER — METRONIDAZOLE 500 MG PO TABS: 500.0000 mg | ORAL_TABLET | Freq: Two times a day (BID) | ORAL | 0 refills | Status: DC

## 2022-04-23 NOTE — Discharge Instructions (Addendum)
Your urine and vaginal swab showed trichomonas and bacterial infection in your vagina. Stop by the pharmacy to pick up your prescriptions.  Follow up with your primary care provider for retesting for trichomoniasis in 6 weeks.

## 2022-04-23 NOTE — ED Triage Notes (Signed)
Pt states that she used a new wash. She now irritated in the vaginal area. No discharge. Started this morning.

## 2022-04-23 NOTE — ED Provider Notes (Signed)
[facility]    CSN: [id number] Arrival date & time: 04/23/22  1052      History   Chief Complaint Chief Complaint  Patient presents with   Vaginal Itching     HPI HPI Kayla Caldwell is a 47 y.o. female.    Kayla Caldwell presents for vaginal itching. She used some feminine wipes yesterday and thinks she has a yeast infection. She was sexually active in 2022.  She had open surgery and hasn't have sex since.    Patient has had a hysterectomy. - Abnormal vaginal discharge: no  - vaginal bleeding: no - Dysuria: no - Hematuria: no - Urinary urgency: no - Urinary frequency: no  - Fever: no - Abdominal pain no - Pelvic pain: no - Rash/Skin lesions/mouth ulcers: no - Nausea: no - Vomiting: no  - Back Pain: no  - CVA tenderness  - Headache: no       Past Medical History:  Diagnosis Date   CHF (congestive heart failure) (HCC)    COPD (chronic obstructive pulmonary disease) (HCC)    Diabetes (HCC)    Heart disease    Heart failure (HCC)    Hyperlipidemia    Hypertension    MI (myocardial infarction) [facility])     Patient Active Problem List   Diagnosis Date Noted   Left shoulder pain 06/12/2021   History of CVA (cerebrovascular accident) 06/11/2021   Hypertensive urgency 06/11/2021   AKI (acute kidney injury) ([place]) 06/11/2021   Unstable angina ([place]) 06/11/2021   Chest pain 04/11/2021   HLD (hyperlipidemia) 04/11/2021   Diabetes mellitus without complication ([place]) [id number]   COPD (chronic obstructive pulmonary disease) ([place]) 04/11/2021   CAD (coronary artery disease) 04/11/2021   Hypokalemia 04/11/2021   Obesity, Class III, BMI 40-49.9 (morbid obesity) ([place]) 04/11/2021   Chronic diastolic CHF (congestive heart failure) ([place]) 04/11/2021   Positive D-dimer    Class 3 severe obesity due to excess calories without serious comorbidity with body mass index (BMI) of 40.0 to 44.9 in adult ([place]) 10/27/2020   Atelectasis, left    Lobar  pneumonia ([place]) 10/11/2020   CAD (coronary artery disease), native coronary artery 10/11/2020   CAD s/p  CABG x4 07/2020 10/11/2020   Pneumonia 10/11/2020   Diabetes ([place])    Hypertension     Past Surgical History:  Procedure Laterality Date   ABDOMINAL HYSTERECTOMY     CARDIAC SURGERY     CORONARY ARTERY BYPASS GRAFT      OB History   No obstetric history on file.      Home Medications    Prior to Admission medications   Medication Sig Start Date End Date Taking? Authorizing Provider  amLODipine (NORVASC) 10 MG tablet Take 1 tablet (10 mg total) by mouth daily. 10/27/20  Yes Cletis Athens, MD  aspirin EC 81 MG EC tablet Take 1 tablet (81 mg total) by mouth daily. Swallow whole. 04/13/21  Yes Fritzi Mandes, MD  atorvastatin (LIPITOR) 80 MG tablet Take 1 tablet (80 mg total) by mouth daily. 10/27/20  Yes Masoud, Viann Shove, MD  Bacillus Coagulans-Inulin (PROBIOTIC) 1-250 BILLION-MG CAPS Take 1 capsule by mouth daily. 09/28/21  Yes Carrie Mew, MD  bumetanide (BUMEX) 2 MG tablet Take 2 mg by mouth daily.   Yes [provider]  carvedilol (COREG) 25 MG tablet Take 1 tablet (25 mg total) by mouth 2 (two) times daily with a meal. 10/27/20  Yes Masoud, Viann Shove, MD  clopidogrel (PLAVIX) 75 MG tablet Take 75  mg by mouth daily.   Yes [provider]  Continuous Blood Gluc Sensor (FREESTYLE LIBRE 14 DAY SENSOR) MISC 1 each by Does not apply route every 14 (fourteen) days. 11/30/20  Yes Masoud, Viann Shove, MD  ezetimibe (ZETIA) 10 MG tablet Take 1 tablet (10 mg total) by mouth daily. 10/27/20  Yes Masoud, Viann Shove, MD  hydrOXYzine (ATARAX) 25 MG tablet Take 12.5 mg by mouth at bedtime as needed.   Yes [provider]  insulin glargine (LANTUS) 100 UNIT/ML injection Inject 5 Units into the skin at bedtime.   Yes [provider]  insulin lispro (HUMALOG) 100 UNIT/ML injection Inject 2 Units into the skin 3 (three) times daily before meals.   Yes [provider]   magnesium oxide (MAG-OX) 400 MG tablet Take 400 mg by mouth daily.   Yes [provider]  metroNIDAZOLE (FLAGYL) 500 MG tablet Take 1 tablet (500 mg total) by mouth 2 (two) times daily. 04/23/22  Yes Randel Hargens, DO  pantoprazole (PROTONIX) 40 MG tablet Take 1 tablet (40 mg total) by mouth 2 (two) times daily as needed. Home med. 06/12/21  Yes Enzo Bi, MD  albuterol (VENTOLIN HFA) 108 (90 Base) MCG/ACT inhaler Inhale 2 puffs into the lungs every 6 (six) hours as needed for wheezing or shortness of breath. 03/19/21   Vanessa Aceitunas, MD  lidocaine (LIDODERM) 5 % Place 1 patch onto the skin daily. Remove & Discard patch within 12 hours or as directed by MD 06/12/21   Enzo Bi, MD  potassium chloride SA (KLOR-CON M) 20 MEQ tablet TAKE 1 TABLET BY MOUTH DAILY 03/12/21   Cletis Athens, MD  ranolazine (RANEXA) 1000 MG SR tablet Take 1 tablet (1,000 mg total) by mouth 2 (two) times daily. 10/27/20   Cletis Athens, MD  senna (SENOKOT) 8.6 MG TABS tablet Take 1 tablet (8.6 mg total) by mouth at bedtime as needed. 06/12/21   Enzo Bi, MD    Family History Family History  Problem Relation Age of Onset   Hypertension Mother    Heart disease Mother    Depression Father    Hypertension Father    Heart disease Father    Breast cancer Maternal Aunt    Cancer Maternal Aunt     Social History Social History   Tobacco Use   Smoking status: Never   Smokeless tobacco: Never  Vaping Use   Vaping Use: Never used  Substance Use Topics   Alcohol use: Never   Drug use: Never     Allergies   Compazine [prochlorperazine], Hydralazine, Lisinopril, Metoclopramide, Naproxen, Nitroglycerin, Nsaids, Tape, Toradol [ketorolac tromethamine], and Tramadol   Review of Systems Review of Systems: :negative unless otherwise stated in HPI.      Physical Exam Triage Vital Signs ED Triage Vitals  Enc Vitals Group     BP 04/23/22 1132 (!) 176/86     Pulse Rate 04/23/22 1132 68     Resp 04/23/22 1132  16     Temp 04/23/22 1132 98.1 F (36.7 C)     Temp Source 04/23/22 1132 Oral     SpO2 04/23/22 1132 97 %     Weight 04/23/22 1130 234 lb (106.1 kg)     Height --      Head Circumference --      Peak Flow --      Pain Score 04/23/22 1129 0     Pain Loc --      Pain Edu? --  Excl. in [place]? --    No data found.  Updated Vital Signs BP (!) 176/86   Pulse 68   Temp 98.1 F (36.7 C) (Oral)   Resp 16   Wt 106.1 kg   SpO2 97%   BMI 41.45 kg/m   Visual Acuity Right Eye Distance:   Left Eye Distance:   Bilateral Distance:    Right Eye Near:   Left Eye Near:    Bilateral Near:     Physical Exam GEN: well appearing female in no acute distress  CVS: well perfused, regular rate and rhythm  RESP: speaking in full sentences without pause, CTA bilaterally   ABD: soft, non-tender, non-distended, no palpable masses   GU: deferred, patient performed self swab  SKIN: warm and dry    UC Treatments / Results  Labs (all labs ordered are listed, but only abnormal results are displayed) Labs Reviewed  WET PREP, GENITAL - Abnormal; Notable for the following components:      Result Value   Trich, Wet Prep PRESENT (*)    Clue Cells Wet Prep HPF POC PRESENT (*)    WBC, Wet Prep HPF POC <10 (*)    All other components within normal limits  URINALYSIS, W/ REFLEX TO CULTURE (INFECTION SUSPECTED) - Abnormal; Notable for the following components:   APPearance HAZY (*)    Leukocytes,Ua TRACE (*)    Bacteria, UA FEW (*)    Trichomonas, UA PRESENT (*)    All other components within normal limits    EKG   Radiology No results found.  Procedures Procedures (including critical care time)  Medications Ordered in UC Medications - No data to display  Initial Impression / Assessment and Plan / UC Course  I have reviewed the triage vital signs and the nursing notes.  Pertinent labs & imaging results that were available during my care of the patient were reviewed by me and  considered in my medical decision making (see chart for details).      Patient is a 47 y.o.Marland Kitchen female  who presents for vaginal irritation.  Overall patient is well-appearing and afebrile.  Vital signs stable.  UA not consistent with acute cystitis. She does have trichomoniasis on microscopy. Bacterial vaginitis and trichomoniasis confirmed on wet prep.  Gonorrhea and Chlamydia testing declined and she is adamant that she is not sexually active.  - Treatment: Flagyl 500 BID x 7 days and advised patient to not drink alcohol while taking this medication.  - Abstain from coitus during course of treatment.  - Repeat testing in 6 weeks   Return precautions including abdominal pain, fever, chills, nausea, or vomiting given. Discussed MDM, treatment plan and plan for follow-up with patient who agrees with plan.        Final Clinical Impressions(s) / UC Diagnoses   Final diagnoses:  BV (bacterial vaginosis)  Trichomonal vaginitis     Discharge Instructions      Your urine and vaginal swab showed trichomonas and bacterial infection in your vagina. Stop by the pharmacy to pick up your prescriptions.  Follow up with your primary care provider for retesting for trichomoniasis in 6 weeks.      ED Prescriptions     Medication Sig Dispense Auth. Provider   metroNIDAZOLE (FLAGYL) 500 MG tablet Take 1 tablet (500 mg total) by mouth 2 (two) times daily. 14 tablet Sereniti Wan, Ronnette Juniper, DO      PDMP not reviewed this encounter.   Lyndee Hensen, DO 04/24/22 1210

## 2022-04-26 ENCOUNTER — Ambulatory Visit
Admission: RE | Admit: 2022-04-26 | Discharge: 2022-04-26 | Disposition: A | Discharge status: Home / Self Care | Payer: 59 | Source: Ambulatory Visit | Attending: Orthopedic Surgery | Admitting: Orthopedic Surgery

## 2022-04-26 DIAGNOSIS — R2231 Localized swelling, mass and lump, right upper limb: Secondary | ICD-10-CM

## 2022-04-26 DIAGNOSIS — M25512 Pain in left shoulder: Secondary | ICD-10-CM

## 2022-04-26 MED ORDER — GADOPICLENOL 0.5 MMOL/ML IV SOLN
10.0000 mL | Freq: Once | INTRAVENOUS | Status: AC | PRN
  Administered 2022-04-26: 10 mL via INTRAVENOUS

## 2022-04-28 ENCOUNTER — Other Ambulatory Visit: Payer: Self-pay

## 2022-04-28 ENCOUNTER — Emergency Department (HOSPITAL_COMMUNITY)
Admission: EM | Admit: 2022-04-28 | Discharge: 2022-04-28 | Disposition: A | Discharge status: Home / Self Care | Payer: 59 | Attending: Emergency Medicine | Admitting: Emergency Medicine

## 2022-04-28 DIAGNOSIS — Z7982 Long term (current) use of aspirin: Secondary | ICD-10-CM | POA: Diagnosis not present

## 2022-04-28 DIAGNOSIS — D1722 Benign lipomatous neoplasm of skin and subcutaneous tissue of left arm: Secondary | ICD-10-CM | POA: Diagnosis not present

## 2022-04-28 DIAGNOSIS — Z7902 Long term (current) use of antithrombotics/antiplatelets: Secondary | ICD-10-CM | POA: Diagnosis not present

## 2022-04-28 DIAGNOSIS — Z794 Long term (current) use of insulin: Secondary | ICD-10-CM | POA: Insufficient documentation

## 2022-04-28 DIAGNOSIS — M25512 Pain in left shoulder: Secondary | ICD-10-CM | POA: Diagnosis present

## 2022-04-28 DIAGNOSIS — M75102 Unspecified rotator cuff tear or rupture of left shoulder, not specified as traumatic: Secondary | ICD-10-CM | POA: Insufficient documentation

## 2022-04-28 MED ORDER — OXYCODONE-ACETAMINOPHEN 5-325 MG PO TABS
2.0000 | ORAL_TABLET | Freq: Once | ORAL | Status: AC
  Administered 2022-04-28: 2 via ORAL
  Filled 2022-04-28: qty 2

## 2022-04-28 MED ORDER — OXYCODONE HCL 5 MG PO TABS: 5.0000 mg | ORAL_TABLET | ORAL | 0 refills | Status: DC | PRN

## 2022-04-28 NOTE — ED Provider Notes (Signed)
[facility] Provider Note   CSN: [id number] Arrival date & time: 04/28/22  1041     History  Chief Complaint  Patient presents with   lump on shoulder    Analyse Kayla Caldwell is a 47 y.o. female.  Patient presents with worsening left shoulder pain especially last few weeks.  Patient had intermittent for several months.  Patient had MRI done outpatient yesterday but not sure results.  Patient denies any fevers or new injuries.  No history of known rotator or shoulder pathology.  Patient denies any chest pain, back pain or abdominal pain with this.       Home Medications Prior to Admission medications   Medication Sig Start Date End Date Taking? Authorizing Provider  albuterol (VENTOLIN HFA) 108 (90 Base) MCG/ACT inhaler Inhale 2 puffs into the lungs every 6 (six) hours as needed for wheezing or shortness of breath. 03/19/21   Vanessa Jarales, MD  amLODipine (NORVASC) 10 MG tablet Take 1 tablet (10 mg total) by mouth daily. 10/27/20   Cletis Athens, MD  aspirin EC 81 MG EC tablet Take 1 tablet (81 mg total) by mouth daily. Swallow whole. 04/13/21   Fritzi Mandes, MD  atorvastatin (LIPITOR) 80 MG tablet Take 1 tablet (80 mg total) by mouth daily. 10/27/20   Cletis Athens, MD  Bacillus Coagulans-Inulin (PROBIOTIC) 1-250 BILLION-MG CAPS Take 1 capsule by mouth daily. 09/28/21   Carrie Mew, MD  bumetanide (BUMEX) 2 MG tablet Take 2 mg by mouth daily.    [provider]  carvedilol (COREG) 25 MG tablet Take 1 tablet (25 mg total) by mouth 2 (two) times daily with a meal. 10/27/20   Cletis Athens, MD  clopidogrel (PLAVIX) 75 MG tablet Take 75 mg by mouth daily.    [provider]  Continuous Blood Gluc Sensor (FREESTYLE LIBRE 14 DAY SENSOR) MISC 1 each by Does not apply route every 14 (fourteen) days. 11/30/20   Cletis Athens, MD  ezetimibe (ZETIA) 10 MG tablet Take 1 tablet (10 mg total) by mouth daily. 10/27/20   Cletis Athens, MD   hydrOXYzine (ATARAX) 25 MG tablet Take 12.5 mg by mouth at bedtime as needed.    [provider]  insulin glargine (LANTUS) 100 UNIT/ML injection Inject 5 Units into the skin at bedtime.    [provider]  insulin lispro (HUMALOG) 100 UNIT/ML injection Inject 2 Units into the skin 3 (three) times daily before meals.    [provider]  lidocaine (LIDODERM) 5 % Place 1 patch onto the skin daily. Remove & Discard patch within 12 hours or as directed by MD 06/12/21   Enzo Bi, MD  magnesium oxide (MAG-OX) 400 MG tablet Take 400 mg by mouth daily.    [provider]  metroNIDAZOLE (FLAGYL) 500 MG tablet Take 1 tablet (500 mg total) by mouth 2 (two) times daily. 04/23/22   Brimage, Ronnette Juniper, DO  oxyCODONE (ROXICODONE) 5 MG immediate release tablet Take 1 tablet (5 mg total) by mouth every 4 (four) hours as needed for severe pain. 04/28/22   Elnora Morrison, MD  pantoprazole (PROTONIX) 40 MG tablet Take 1 tablet (40 mg total) by mouth 2 (two) times daily as needed. Home med. 06/12/21   Enzo Bi, MD  potassium chloride SA (KLOR-CON M) 20 MEQ tablet TAKE 1 TABLET BY MOUTH DAILY 03/12/21   Cletis Athens, MD  ranolazine (RANEXA) 1000 MG SR tablet Take 1 tablet (1,000 mg total) by mouth 2 (  two) times daily. 10/27/20   Cletis Athens, MD  senna (SENOKOT) 8.6 MG TABS tablet Take 1 tablet (8.6 mg total) by mouth at bedtime as needed. 06/12/21   Enzo Bi, MD      Allergies    Compazine [prochlorperazine], Hydralazine, Lisinopril, Metoclopramide, Naproxen, Nitroglycerin, Nsaids, Tape, Toradol [ketorolac tromethamine], and Tramadol    Review of Systems   Review of Systems  Constitutional:  Negative for chills and fever.  HENT:  Negative for congestion.   Eyes:  Negative for visual disturbance.  Respiratory:  Negative for shortness of breath.   Cardiovascular:  Negative for chest pain.  Gastrointestinal:  Negative for abdominal pain and vomiting.  Genitourinary:  Negative for  dysuria and flank pain.  Musculoskeletal:  Negative for back pain, neck pain and neck stiffness.  Skin:  Negative for rash.  Neurological:  Negative for light-headedness and headaches.    Physical Exam Updated Vital Signs BP (!) 165/94 (BP Location: Right Arm)   Pulse (!) 57   Temp 98.1 F (36.7 C)   Resp 18   Ht '5\' 3"'$  (1.6 m)   Wt 105.2 kg   SpO2 100%   BMI 41.10 kg/m  Physical Exam Vitals and nursing note reviewed.  Constitutional:      General: She is not in acute distress.    Appearance: She is well-developed.  HENT:     Head: Normocephalic and atraumatic.     Mouth/Throat:     Mouth: Mucous membranes are moist.  Eyes:     General:        Right eye: No discharge.        Left eye: No discharge.     Conjunctiva/sclera: Conjunctivae normal.  Neck:     Trachea: No tracheal deviation.  Cardiovascular:     Rate and Rhythm: Normal rate and regular rhythm.  Pulmonary:     Effort: Pulmonary effort is normal.  Abdominal:     General: There is no distension.     Palpations: Abdomen is soft.     Tenderness: There is no abdominal tenderness. There is no guarding.  Musculoskeletal:        General: Tenderness present. Normal range of motion.     Cervical back: Normal range of motion and neck supple. No rigidity.     Comments: Patient has moderate tenderness with palpation anterior and lateral left shoulder mild joint effusion, lipoma palpated anteriorly approximately 3 cm.  No significant warmth, no erythema.  Patient has decreased range of motion flexion abduction and external rotation.  No significant tenderness distally.  Patient is normal strength with grip strength and wrist extension.  Skin:    General: Skin is warm.     Capillary Refill: Capillary refill takes less than 2 seconds.     Findings: No rash.  Neurological:     General: No focal deficit present.     Mental Status: She is alert.     Cranial Nerves: No cranial nerve deficit.  Psychiatric:        Mood and  Affect: Mood normal.     ED Results / Procedures / Treatments   Labs (all labs ordered are listed, but only abnormal results are displayed) Labs Reviewed - No data to display  EKG None  Radiology No results found.  Procedures Procedures    Medications Ordered in ED Medications  oxyCODONE-acetaminophen (PERCOCET/ROXICET) 5-325 MG per tablet 2 tablet (2 tablets Oral Given 04/28/22 1656)    ED Course/ Medical Decision Making/ A&P  Medical Decision Making Risk Prescription drug management.   Patient presents with worsening shoulder pain for several months without new direct trauma.  Clinical concern for rotator cuff strain/tear given exam.  MRI results reviewed independently showing lipoma and supraspinatus tear.  Results reviewed independently.  Pain meds given in the ER patient is not driving and allergic to NSAIDs.  Short pain med prescription narcotics given sent to pharmacy.  Instructed patient follow-up with orthopedics to discuss treatment options.  Patient and family member comfortable with this plan.  No sign of significant infection or septic joint.        Final Clinical Impression(s) / ED Diagnoses Final diagnoses:  Nontraumatic tear of supraspinatus tendon, left  Lipoma of left shoulder    Rx / DC Orders ED Discharge Orders          Ordered    oxyCODONE (ROXICODONE) 5 MG immediate release tablet  Every 4 hours PRN,   Status:  Discontinued        04/28/22 1655    oxyCODONE (ROXICODONE) 5 MG immediate release tablet  Every 4 hours PRN        04/28/22 1705              Elnora Morrison, MD 04/28/22 1707

## 2022-04-28 NOTE — Discharge Instructions (Signed)
Take Tylenol as needed every 4 hours for mild pain and use ice regularly. For severe pain take perocet however realize they have the potential for addiction and it can make you sleepy and has tylenol in it.  No operating machinery while taking. Call orthopedics for urgent follow-up.

## 2022-04-28 NOTE — ED Provider Triage Note (Signed)
Emergency Medicine Provider Triage Evaluation Note  Kayla Caldwell , a 46 y.o. female  was evaluated in triage.  Pt complains of left shoulder pain. She states that same has been ongoing for several months. Has a swollen 'lump' on her left shoulder that is progressively becoming more painful.  Has been following outpatient for same and had an MRI yesterday but has not gotten results back yet. States 'my daughter told me to come here for management because I dont feel like my doctor knows what he's doing.' She denies any changes in condition today.   Of note, upon chart review, MRI imaging reveals   1. In the area of interest of the superolateral left shoulder, there is prominent subcutaneous fat. The patient's palpable abnormality appears to correspond to a prominent benign fat lobule within the subcutaneous fat. This does not demonstrate a complete outer border to indicate a discrete lipoma. It is not significantly changed compared to 06/12/2021 MRI. 2. Interval worsening of the previously seen midsubstance tear at the critical zone of the posterior supraspinatus tendon, now a moderate to high-grade partial-thickness tear nearly extending through the articular tendon surface and measuring up to 9 mm in AP dimension. No significant tendon retraction. 3. New horizontal linear partial-thickness midsubstance tear of the anterior supraspinatus tendon footprint measuring up to 7 mm in transverse dimension and 5 mm in AP dimension. 4. Unchanged partial-thickness interstitial tear at the deep aspect of the infraspinatus musculotendinous junction. 5. New mild superior subscapularis tendinosis. 6. Mild-to-moderate degenerative changes of the acromioclavicular joint. Unchanged os acromiale.  Review of Systems  Positive:  Negative:   Physical Exam  BP (!) 165/94 (BP Location: Right Arm)   Pulse (!) 57   Temp 98.1 F (36.7 C)   Resp 18   Ht '5\' 3"'$  (1.6 m)   Wt 105.2 kg   SpO2 100%    BMI 41.10 kg/m  Gen:   Awake, no distress   Resp:  Normal effort  MSK:   Moves extremities without difficulty  Other:  TTP left shoulder  Medical Decision Making  Medically screening exam initiated at 11:41 AM.  Appropriate orders placed.  Vinnie Langton Kalyssa Brozek was informed that the remainder of the evaluation will be completed by another provider, this initial triage assessment does not replace that evaluation, and the importance of remaining in the ED until their evaluation is complete.     Bud Face, PA-C 04/28/22 1144

## 2022-04-28 NOTE — ED Triage Notes (Signed)
Pt. Stated, Kayla Caldwell had a mass or lipoma on my left shoulder for a year . I had a MRI 2 days ago and Im waiting for the results. It started hurting about 2 months ago bad.

## 2022-04-28 NOTE — ED Notes (Signed)
Pt called out to this RN stating that she wanted to leave AMA. Provider informed and stated that he would assess pt.

## 2022-05-04 ENCOUNTER — Ambulatory Visit
Admission: RE | Admit: 2022-05-04 | Discharge: 2022-05-04 | Disposition: A | Discharge status: Home / Self Care | Payer: 59 | Source: Ambulatory Visit | Attending: Family Medicine | Admitting: Family Medicine

## 2022-05-04 DIAGNOSIS — Z1231 Encounter for screening mammogram for malignant neoplasm of breast: Secondary | ICD-10-CM | POA: Diagnosis present

## 2022-06-14 ENCOUNTER — Ambulatory Visit
Admit: 2022-06-14 | Payer: MEDICARE | Attending: Student in an Organized Health Care Education/Training Program | Primary: Student in an Organized Health Care Education/Training Program

## 2022-06-15 ENCOUNTER — Other Ambulatory Visit: Payer: Self-pay

## 2022-06-15 ENCOUNTER — Emergency Department
Admission: EM | Admit: 2022-06-15 | Discharge: 2022-06-15 | Disposition: A | Discharge status: Home / Self Care | Payer: 59 | Attending: Emergency Medicine | Admitting: Emergency Medicine

## 2022-06-15 ENCOUNTER — Encounter: Payer: Self-pay | Admitting: *Deleted

## 2022-06-15 ENCOUNTER — Emergency Department: Payer: 59

## 2022-06-15 DIAGNOSIS — R079 Chest pain, unspecified: Secondary | ICD-10-CM | POA: Diagnosis present

## 2022-06-15 DIAGNOSIS — R7989 Other specified abnormal findings of blood chemistry: Secondary | ICD-10-CM | POA: Insufficient documentation

## 2022-06-15 DIAGNOSIS — M25512 Pain in left shoulder: Secondary | ICD-10-CM | POA: Insufficient documentation

## 2022-06-15 LAB — TROPONIN I (HIGH SENSITIVITY)
Troponin I (High Sensitivity): 18 ng/L — ABNORMAL HIGH (ref <18)
Troponin I (High Sensitivity): 19 ng/L — ABNORMAL HIGH (ref <18)

## 2022-06-15 LAB — BASIC METABOLIC PANEL
Anion gap: 8 (ref 5–15)
BUN: 20 mg/dL (ref 6–20)
CO2: 23 mmol/L (ref 22–32)
Calcium: 8.8 mg/dL — ABNORMAL LOW (ref 8.9–10.3)
Chloride: 106 mmol/L (ref 98–111)
Creatinine, Ser: 0.91 mg/dL (ref 0.44–1.00)
GFR, Estimated: >60 mL/min (ref >60)
Glucose, Bld: 179 mg/dL — ABNORMAL HIGH (ref 70–99)
Potassium: 3.4 mmol/L — ABNORMAL LOW (ref 3.5–5.1)
Sodium: 137 mmol/L (ref 135–145)

## 2022-06-15 LAB — CBC
HCT: 37 % (ref 36.0–46.0)
Hemoglobin: 12.2 g/dL (ref 12.0–15.0)
MCH: 29.1 pg (ref 26.0–34.0)
MCHC: 33 g/dL (ref 30.0–36.0)
MCV: 88.3 fL (ref 80.0–100.0)
Platelets: 225 10*3/uL (ref 150–400)
RBC: 4.19 MIL/uL (ref 3.87–5.11)
RDW: 13.7 % (ref 11.5–15.5)
WBC: 7.7 10*3/uL (ref 4.0–10.5)
nRBC: 0 % (ref 0.0–0.2)

## 2022-06-15 MED ORDER — CYCLOBENZAPRINE HCL 5 MG PO TABS: 5.0000 mg | ORAL_TABLET | Freq: Three times a day (TID) | ORAL | 0 refills | Status: DC | PRN

## 2022-06-15 MED ORDER — FENTANYL CITRATE PF 50 MCG/ML IJ SOSY
50.0000 ug | PREFILLED_SYRINGE | Freq: Once | INTRAMUSCULAR | Status: AC
  Administered 2022-06-15: 50 ug via INTRAVENOUS
  Filled 2022-06-15: qty 1

## 2022-06-15 NOTE — ED Provider Notes (Signed)
[facility] Provider Note    Event Date/Time   First MD Initiated Contact with Patient 06/15/22 2104     (approximate)   History   Chest Pain   HPI  Kayla Caldwell is a 47 y.o. female  who presents to the emergency department today because of concern for arm and chest pain. Arm pain started yesterday. Left arm. States she does have history of rotator cuff issues so was not sure if that was what it was. Does lift at work. Today however started having pain in her left chest. Sharp pain. No SOB. No N/V. No measured fevers but she does state she is in menopause so occasionally will have hot flashes. Does have history of cardiac disease.       Physical Exam   Triage Vital Signs: ED Triage Vitals  Enc Vitals Group     BP 06/15/22 1922 (!) 207/98     Pulse Rate 06/15/22 1922 (!) 105     Resp 06/15/22 1922 20     Temp 06/15/22 1922 98.6 F (37 C)     Temp Source 06/15/22 1922 Oral     SpO2 06/15/22 1922 97 %     Weight 06/15/22 1922 235 lb (106.6 kg)     Height 06/15/22 1922  (1.6 m)     Head Circumference --      Peak Flow --      Pain Score 06/15/22 1925 8     Pain Loc --      Pain Edu? --      Excl. in GC? --     Most recent vital signs: Vitals:   06/15/22 1922  BP: (!) 207/98  Pulse: (!) 105  Resp: 20  Temp: 98.6 F (37 C)  SpO2: 97%   General: Awake, alert, oriented. CV:  Good peripheral perfusion. Regular rate and rhythm. Resp:  Normal effort. Lungs clear. Abd:  No distention.  Other:  No lower extremity edema.    ED Results / Procedures / Treatments   Labs (all labs ordered are listed, but only abnormal results are displayed) Labs Reviewed  BASIC METABOLIC PANEL - Abnormal; Notable for the following components:      Result Value   Potassium 3.4 (*)    Glucose, Bld 179 (*)    Calcium 8.8 (*)    All other components within normal limits  TROPONIN I (HIGH SENSITIVITY) - Abnormal; Notable for the following  components:   Troponin I (High Sensitivity) 18 (*)    All other components within normal limits  TROPONIN I (HIGH SENSITIVITY) - Abnormal; Notable for the following components:   Troponin I (High Sensitivity) 19 (*)    All other components within normal limits  CBC     EKG  I, Phineas Semen, attending physician, personally viewed and interpreted this EKG  EKG Time: 1927 Rate: 82 Rhythm: normal sinus rhythm Axis: normal Intervals: qtc 469 QRS: narrow, q waves II, III, aVF, V1 ST changes: no st elevation Impression: abnormal ekg   RADIOLOGY I independently interpreted and visualized the CXR. My interpretation: No pneumonia Radiology interpretation:  IMPRESSION:  No active cardiopulmonary disease.      PROCEDURES:  Critical Care performed: No   MEDICATIONS ORDERED IN ED: Medications - No data to display   IMPRESSION / MDM / ASSESSMENT AND PLAN / ED COURSE  I reviewed the triage vital signs and the nursing notes.  Differential diagnosis includes, but is not limited to, ACS, pneumonia, PTX, PE.  Patient's presentation is most consistent with acute presentation with potential threat to life or bodily function.   The patient is on the cardiac monitor to evaluate for evidence of arrhythmia and/or significant heart rate changes.  Patient presented to the emergency department today because of concerns for left chest pain.  Patient had also been having some left shoulder pain.  On exam lungs were clear.  Regular rate and rhythm.  Chest x-ray did not show any pneumonia or pneumothorax.  Troponin minimally elevated did not have any significant change on repeat patient has had slight elevation in the past.  I did discuss possibility of PE with patient.  However patient without any concerning risk factors.  She did have CT angio performed earlier this year which was negative.  Patient states she has chronically elevated D-dimers and felt  comfortable deferring CT angio at this time.  I do think this is reasonable.  I do think it is likely patient's pain is more muscle skeletal in nature.  She does have history of rotator cuff injuries and did heavy lifting at work.  Will plan on discharging.      FINAL CLINICAL IMPRESSION(S) / ED DIAGNOSES   Final diagnoses:  Nonspecific chest pain        Note:  This document was prepared using Dragon voice recognition software and may include unintentional dictation errors.    Phineas Semen, MD 06/15/22 [phone]

## 2022-06-15 NOTE — ED Provider Triage Note (Signed)
Emergency Medicine Provider Triage Evaluation Note  Kayla Caldwell , a 47 y.o. female  was evaluated in triage.  Pt complains of left shoulder pain and left side chest pain x 2 days. History of CABG patient states that she cannot distinguish between her chronic shoulder pain due to rotator cuff tear or cardiac related pain.   Physical Exam  BP (!) 207/98 (BP Location: Right Arm)   Pulse (!) 105   Temp 98.6 F (37 C) (Oral)   Resp 20   Ht  (1.6 m)   Wt 106.6 kg   SpO2 97%   BMI 41.63 kg/m  Gen:   Awake, no distress   Resp:  Normal effort  MSK:   Moves extremities without difficulty  Other:    Medical Decision Making  Medically screening exam initiated at 7:27 PM.  Appropriate orders placed.  Yehuda Savannah Jolea Dolle was informed that the remainder of the evaluation will be completed by another provider, this initial triage assessment does not replace that evaluation, and the importance of remaining in the ED until their evaluation is complete.  Cardiac protocols initiated.    Chinita Pester, FNP 06/15/22 2342

## 2022-06-15 NOTE — Discharge Instructions (Signed)
Please seek medical attention for any high fevers, chest pain, shortness of breath, change in behavior, persistent vomiting, bloody stool or any other new or concerning symptoms.  

## 2022-06-15 NOTE — ED Triage Notes (Signed)
Pt has pain in left shoulder and chest pain.  Sx for 2 days.  No n/v  pt reports sweating heavy today.  Pt alert  speech clear.

## 2022-07-05 ENCOUNTER — Ambulatory Visit
Admit: 2022-07-05 | Discharge: 2022-07-06 | Payer: MEDICARE | Attending: Student in an Organized Health Care Education/Training Program | Primary: Student in an Organized Health Care Education/Training Program

## 2022-07-05 DIAGNOSIS — E1121 Type 2 diabetes mellitus with diabetic nephropathy: Principal | ICD-10-CM

## 2022-07-05 DIAGNOSIS — Z9861 Coronary angioplasty status: Principal | ICD-10-CM

## 2022-07-05 DIAGNOSIS — M75102 Unspecified rotator cuff tear or rupture of left shoulder, not specified as traumatic: Principal | ICD-10-CM

## 2022-07-05 DIAGNOSIS — Z794 Long term (current) use of insulin: Principal | ICD-10-CM

## 2022-07-05 DIAGNOSIS — Z951 Presence of aortocoronary bypass graft: Principal | ICD-10-CM

## 2022-07-05 DIAGNOSIS — I5032 Chronic diastolic (congestive) heart failure: Principal | ICD-10-CM

## 2022-07-05 DIAGNOSIS — E7849 Other hyperlipidemia: Principal | ICD-10-CM

## 2022-07-05 DIAGNOSIS — Z87891 Personal history of nicotine dependence: Principal | ICD-10-CM

## 2022-07-05 DIAGNOSIS — I10 Essential (primary) hypertension: Principal | ICD-10-CM

## 2022-07-05 DIAGNOSIS — I252 Old myocardial infarction: Principal | ICD-10-CM

## 2022-07-05 MED ORDER — SPIRONOLACTONE 25 MG TABLET
ORAL_TABLET | Freq: Every day | ORAL | 3 refills | 90 days | Status: CP
Start: 2022-07-05 — End: 2023-07-05

## 2022-07-05 MED ORDER — ATORVASTATIN 80 MG TABLET
ORAL_TABLET | Freq: Every day | ORAL | 0 refills | 90 days | Status: CP
Start: 2022-07-05 — End: ?

## 2022-07-05 MED ORDER — CLOPIDOGREL 75 MG TABLET
ORAL_TABLET | Freq: Every day | ORAL | 0 refills | 90 days | Status: CP
Start: 2022-07-05 — End: ?

## 2022-07-05 MED ORDER — EZETIMIBE 10 MG TABLET
ORAL_TABLET | Freq: Every day | ORAL | 0 refills | 90 days | Status: CP
Start: 2022-07-05 — End: ?

## 2022-07-05 MED ORDER — BUMETANIDE 2 MG TABLET
ORAL_TABLET | ORAL | 3 refills | 90 days | Status: CP
Start: 2022-07-05 — End: 2023-06-30

## 2022-07-05 MED ORDER — CARVEDILOL 25 MG TABLET
ORAL_TABLET | Freq: Two times a day (BID) | ORAL | 0 refills | 90 days | Status: CP
Start: 2022-07-05 — End: ?

## 2022-07-05 MED ORDER — RANOLAZINE ER 500 MG TABLET,EXTENDED RELEASE,12 HR
ORAL_TABLET | Freq: Two times a day (BID) | ORAL | 3 refills | 90 days | Status: CP
Start: 2022-07-05 — End: 2023-06-30

## 2022-07-06 MED ORDER — EVOLOCUMAB 140 MG/ML SUBCUTANEOUS PEN INJECTOR
SUBCUTANEOUS | 0 refills | 0 days | Status: CP
Start: 2022-07-06 — End: 2023-05-25
  Filled 2022-07-14: qty 6, 84d supply, fill #0

## 2022-07-11 DIAGNOSIS — Z951 Presence of aortocoronary bypass graft: Principal | ICD-10-CM

## 2022-07-11 DIAGNOSIS — Z9861 Coronary angioplasty status: Principal | ICD-10-CM

## 2022-07-11 DIAGNOSIS — I252 Old myocardial infarction: Principal | ICD-10-CM

## 2022-07-13 MED ORDER — EMPTY CONTAINER
2 refills | 0 days
Start: 2022-07-13 — End: ?

## 2022-07-13 NOTE — Unmapped (Signed)
[facility] Specialty Medication Onboarding    Specialty Medication: Repatha  Prior Authorization: Approved   Financial Assistance: No - copay  <$25  Final Copay/Day Supply: $0 / 84    Insurance Restrictions: None     Notes to Pharmacist: None  Credit Card on File: not applicable    The triage team has completed the benefits investigation and has determined that the patient is able to fill this medication at [facility]. Please contact the patient to complete the onboarding or follow up with the prescribing physician as needed.

## 2022-07-13 NOTE — Unmapped (Signed)
SPORTS MEDICINE NEW VISIT    ASSESSMENT      Deborah Bauer is a 47 y.o. female  with:  1. Chronic L shoulder pain, rotator cuff tendonitis    PLAN:    -- We had a good discussion with the patient regarding her operative and nonoperative management options. She is at higher risk for complications following rotator cuff surgery due to her cardiac history but surgery could be an option for her in the future. She has a strong desire to avoid surgery at this time. We recommend another corticosteroid injection as well as a referral to physical therapy and pain management.     Risks and benefits of injection were discussed. were discussed.  Timeout was performed.  A mixture of 9 cc of ropivacaine and 40 mg of Kenalog was injected into the left shoulder subacromial space using aseptic tecnique.  The patient tolerated this well and reported immediate improvement in left shoulder pain    --Imaging findings, further diagnostic and therapeutic options were reviewed with the patient, along with the benefits and drawbacks of each, and the patient expressed understanding    Prescriptions today:  physical therapy, pain management    Follow-up: PRN    X-rays at next visit:  None      SUBJECTIVE       History of Present Illness: 47 y.o. female who presents for initial evaluation of her left shoulder. She has a multiple year history of left shoulder pain. She has previously seen Dr. Allena Katz for this issue and has had multiple injections. The injections tend to offer good pain relief for about a month. 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 Her left shoulder pain is waking her up at night. She has previously been offered arthroscopic surgery but her cardiologist recommended she avoid it due to her history of multiple Mis and a CABG in 2022. She cannot take NSAIDs due to her heart history but she takes flexeril and tylenol. She has a strong preference to avoid surgery. She is left hand dominant. Medical History  Past Medical History:   Diagnosis Date    Anxiety     Arthritis     Cardiovascular disease     CHF (congestive heart failure) (CMS-HCC)     COPD (chronic obstructive pulmonary disease) (CMS-HCC)     Dental caries     Hypertension     Myocardial infarction (CMS-HCC) 08/03/2020    Heart attack, 3rd heart attack had two prior to this event    Type 2 diabetes mellitus (CMS-HCC)     a1c- 7.01 August 2021,      a1c of  13 in 2022    Surgical History  Past Surgical History:   Procedure Laterality Date    CORONARY ARTERY BYPASS GRAFT  08/18/2020    Medications  has a current medication list which includes the following prescription(s): acetaminophen, albuterol, albuterol sulfate, amlodipine-atorvastatin, aspirin, atorvastatin, bumetanide, carvedilol, clopidogrel, cyclobenzaprine, diclofenac sodium, empty container, evolocumab, ezetimibe, flash glucose sensor, fluticasone propionate, hydroxyzine, insulin glargine, insulin lispro, ondansetron, ranolazine, semaglutide, spironolactone, suvorexant, and vilazodone.   Tobacco/Alcohol History  Social History     Tobacco Use   Smoking Status Former    Current packs/day: 0.00    Types: Cigarettes    Quit date: 2010    Years since quitting: 14.3   Smokeless Tobacco Never     Social History     Substance and Sexual Activity   Alcohol Use Never  Social History        Occupational History    Occupation: Arts administrator: [employer]       Home Address:  [address]  [place] [address] Family History  Family History   Problem Relation Age of Onset    Heart disease Mother     Heart disease Father         Allergies   Pantoprazole, Tramadol, Jardiance [empagliflozin], Compazine [prochlorperazine], Hydralazine, Lisinopril, Nitroglycerin, Reglan [metoclopramide hcl], Toradol [ketorolac], and Adhesive tape-silicones       Review of Systems  .   Marland Kitchen  No chest pain, dyspnea, nausea, fevers, chills         OBJECTIVE     Physical Exam:    DETAILED PHYSICAL EXAM  General Appearance well-nourished and no acute distress   Vitals Estimated body mass index is 41.93 kg/m?? as calculated from the following:    Height as of 01/24/22: 160 cm (5' 3).    Weight as of 07/05/22: 107.4 kg (236 lb 11.2 oz).   Mood and Affect alert, cooperative, and pleasant       Cardiovascular well-perfused distally and no swelling         MUSCULOSKELETAL   LEFT:      SHOULDER: Range of motion: FE 0-100, ER 45, IR to L4  Strength:4/5 FE, ABD, ER, IR  Provocative tests/ tenderness: anterior shoulder pain with any manipulation of arm          Imaging/other tests: none    Orthopaedic PROMIS:  Failed to redirect to the Timeline version of the REVFS SmartLink.         No data to display                PRO Status:  Patient completed PRO.      ADMINISTRATIVE     I have personally reviewed and interpreted the images (as available).  I have personally reviewed prior records and incorporated relevant information above (as available).    @[email or web address] @    PATIENT PROFILE     Practice: new to practice  Plan: Continued conservative management     PROCEDURES     Procedures     DME     DME ORDER:  Dx:  ,                     ______________________________________________________________________    Documentation assistance was provided by Kelle Darting, Scribe, on 07/13/2022 at 3:25 PM for Dr. Olga Millers, Queen Blossom, MD

## 2022-07-13 NOTE — Unmapped (Signed)
[facility]   Specialty Lite Counseling    Deborah Bauer is a 47 y.o. female with history of STEMI/ CABG who I am counseling today on initiation of therapy.  I am speaking to the patient.    Was a Nurse, learning disability used for this call? No    Verified patient's date of birth / HIPAA.    Specialty Lite medication(s) to be sent: General Specialty: Repatha      Non-specialty medications/supplies to be sent: Higher education careers adviser      Medications not needed at this time: n/a         An offer to provide counseling to the patient regarding their medication was made. The patient declined counseling      Current Medications (including OTC/herbals), Comorbidities and Allergies     Current Outpatient Medications   Medication Sig Dispense Refill    acetaminophen (TYLENOL) 325 MG tablet Take 2 tablets (650 mg total) by mouth every four (4) hours as needed.      albuterol HFA 90 mcg/actuation inhaler Inhale 2 puffs.      albuterol sulfate 90 mcg/actuation AePB Inhale 2 puffs every four (4) hours as needed.      amlodipine-atorvastatin (CADUET) 10-10 mg per tablet Take 1 tablet by mouth daily.      aspirin (ECOTRIN) 81 MG tablet Take 1 tablet (81 mg total) by mouth daily.      atorvastatin (LIPITOR) 80 MG tablet Take 1 tablet (80 mg total) by mouth daily. 90 tablet 0    bumetanide (BUMEX) 2 MG tablet Take 1 tablet (2 mg total) by mouth every other day. 45 tablet 3    carvedilol (COREG) 25 MG tablet Take 1 tablet (25 mg total) by mouth two (2) times a day. 180 tablet 0    clopidogrel (PLAVIX) 75 mg tablet Take 1 tablet (75 mg total) by mouth daily. 90 tablet 0    cyclobenzaprine (FLEXERIL) 5 MG tablet Take 1 tablet (5 mg total) by mouth.      diclofenac sodium (VOLTAREN) 1 % gel Apply 2 g topically.      evolocumab 140 mg/mL PnIj Inject 140 mg under the skin every fourteen (14) days for 24 doses. 10 mL 0    ezetimibe (ZETIA) 10 mg tablet Take 1 tablet (10 mg total) by mouth daily. 90 tablet 0    flash glucose sensor kit 1 each by Miscellaneous route.      fluticasone propionate (FLONASE) 50 mcg/actuation nasal spray 2 sprays into each nostril.      hydrOXYzine (ATARAX) 25 MG tablet Take 0.5 tablets (12.5 mg total) by mouth.      insulin glargine (LANTUS) 100 unit/mL injection Inject 0.02 mL (2 Units total) under the skin nightly.      insulin lispro (HUMALOG) 100 unit/mL injection Inject 0.04 mL (4 Units total) under the skin Three (3) times a day before meals. 4 units before meals      ondansetron (ZOFRAN) 8 MG tablet       ranolazine (RANEXA) 500 MG 12 hr tablet Take 1 tablet (500 mg total) by mouth two (2) times a day. 180 tablet 3    semaglutide 3 mg Tab Take 3 mg by mouth. rybelsus      spironolactone (ALDACTONE) 25 MG tablet Take 1 tablet (25 mg total) by mouth daily. 90 tablet 3    suvorexant (BELSOMRA) 5 mg tablet Take 1 tablet (5 mg total) by mouth daily as needed. Marland Kitchen5  vilazodone (VIIBRYD) 20 mg tablet Take 1 tablet (20 mg total) by mouth daily.       No current facility-administered medications for this visit.       Allergies   Allergen Reactions    Pantoprazole Other (See Comments) and Swelling    Tramadol Swelling    Jardiance [Empagliflozin]      Recurrent yeast infections    Compazine [Prochlorperazine]     Hydralazine Other (See Comments)     Reaction Type: Side Effect; Reaction(s): racing thoughts   Anxiety, racing thoughts    Reaction Type: Side Effect; Reaction(s): racing thoughts   Reaction Type: Side Effect; Reaction(s): racing thoughts   Anxiety, racing thoughts   Anxiety, racing thoughts    Anxiety, racing thoughts    Reaction Type: Side Effect; Reaction(s): racing thoughts    Lisinopril     Nitroglycerin Other (See Comments)     Reaction Type: Side Effect; Reaction(s): syncope      Patient states that medication causes her to pass out    Patient states that medication causes her to pass out    Reaction Type: Side Effect; Reaction(s): syncope    Reglan [Metoclopramide Hcl]     Toradol [Ketorolac] Adhesive Tape-Silicones Other (See Comments)       Patient Active Problem List   Diagnosis    Myocardial infarction (CMS-HCC)       Reviewed and up to date in Epic.    Appropriateness of Therapy     Prescription has been clinically reviewed: Yes    Financial Information     Medication Assistance provided: Prior Authorization    Anticipated copay of $0 (84 days) reviewed with patient.     Patient Specific Needs     Does the patient have any physical, cognitive, or cultural barriers? No    Does the patient have adequate living arrangements? (i.e. the ability to store and take their medication appropriately) Yes    Did you identify any home environmental safety or security hazards? No    Patient prefers to have medications discussed with  Patient     Is the patient or caregiver able to read and understand education materials at a high school level or above? Yes    Patient's primary language is  English     Is the patient high risk? No    SOCIAL DETERMINANTS OF HEALTH     At the [facility], we have learned that life circumstances - like trouble affording food, housing, utilities, or transportation can affect the health of many of our patients.   That is why we wanted to ask: are you currently experiencing any life circumstances that are negatively impacting your health and/or quality of life? Patient declined to answer    Social Determinants of Health     Financial Resource Strain: Not on file   Internet Connectivity: Not on file   Food Insecurity: No Food Insecurity (09/07/2020)    Received from [facility]    Hunger Vital Sign     Worried About Running Out of Food in the Last Year: Never true     Ran Out of Food in the Last Year: Never true   Tobacco Use: Medium Risk (06/23/2022)    Received from [facility]    Patient History     Smoking Tobacco Use: Former     Smokeless Tobacco Use: Never     Passive Exposure: Not on file   Housing/Utilities: Not on file   Alcohol Use:  Not At Risk (07/05/2022) Alcohol Use     How often do you have a drink containing alcohol?: Never     How many drinks containing alcohol do you have on a typical day when you are drinking?: 1 - 2     How often do you have 5 or more drinks on one occasion?: Never   Transportation Needs: No Transportation Needs (09/07/2020)    Received from [facility] - Transportation     Lack of Transportation (Medical): No     Lack of Transportation (Non-Medical): No   Substance Use: Low Risk  (07/05/2022)    Substance Use     Taken prescription drugs for non-medical reasons: Never     Taken illegal drugs: Never     Patient indicated they have taken drugs in the past year for non-medical reasons: Yes, [positive answer(s)]: Not on file   Health Literacy: Not on file   Physical Activity: Not on file   Interpersonal Safety: Not on file   Stress: Not on file   Intimate Partner Violence: Not on file   Depression: Not at risk (07/05/2022)    PHQ-2     PHQ-2 Score: 1   Social Connections: Not on file       Would you be willing to receive help with any of the needs that you have identified today? Not applicable    Delivery Information     Verified delivery address.    Scheduled delivery date: 07/14/22    Expected start date: 07/14/22    Medication will be delivered via Same Day Courier to the prescription address in [facility].  This shipment will not require a signature.      Explained the services we provide at [facility] and that each month we will send text messages and/or mychart messages to set up refills. Informed patient that refills should be scheduled 7-10 days prior to when they will run out of medication. Informed patient that a welcome packet, containing information about our pharmacy and other support services, a Notice of Privacy Practices, and a drug information handout will be sent.      The patient or caregiver noted above participated in the development of this care plan and knows that they can request review of or adjustments to the care plan at any time.      Patient or caregiver verbalized understanding of the above information as well as how to contact the pharmacy at [phone] option 4 with any questions/concerns.  The pharmacy is open Monday through Friday 8:30am-4:30pm.  A pharmacist is available 24/7 via pager to answer any clinical questions they may have.      Camillo Flaming, PharmD  [facility] Specialty Pharmacist

## 2022-07-14 ENCOUNTER — Ambulatory Visit: Admit: 2022-07-14 | Discharge: 2022-07-15 | Payer: MEDICARE

## 2022-07-14 MED ADMIN — triamcinolone acetonide (KENALOG-40) injection 40 mg: 40 mg | INTRA_ARTICULAR | @ 16:00:00 | Stop: 2022-07-14

## 2022-07-14 MED FILL — EMPTY CONTAINER: 120 days supply | Qty: 1 | Fill #0

## 2022-07-18 NOTE — Unmapped (Signed)
I saw and evaluated the patient, participating in the key elements the service. I discussed the findings, assessment and plan with Resident and agree with the Resident's findings and plan as documented the Resident's note. I was present for the entirety of the procedure(s).

## 2022-07-27 ENCOUNTER — Ambulatory Visit: Admit: 2022-07-27 | Payer: MEDICARE

## 2022-07-27 NOTE — Unmapped (Signed)
[facility]  [address] [place], [place] [address]    [phone]    This note is to let you know that Ronda Fairly did not show for their scheduled Physical Therapy Evaluation.  Please contact me if you have any questions or concerns.    Thank you for this referral,     Signed: Karn Pickler, PT  07/27/2022 10:05 AM

## 2022-08-03 ENCOUNTER — Emergency Department: Admit: 2022-08-03 | Discharge: 2022-08-04 | Disposition: A | Discharge status: Home / Self Care | Payer: MEDICARE

## 2022-08-03 ENCOUNTER — Ambulatory Visit: Admit: 2022-08-03 | Discharge: 2022-08-04 | Disposition: A | Discharge status: Home / Self Care | Payer: MEDICARE

## 2022-08-03 DIAGNOSIS — R197 Diarrhea, unspecified: Principal | ICD-10-CM

## 2022-08-03 DIAGNOSIS — R079 Chest pain, unspecified: Principal | ICD-10-CM

## 2022-08-03 DIAGNOSIS — R1084 Generalized abdominal pain: Principal | ICD-10-CM

## 2022-08-03 LAB — CBC W/ AUTO DIFF
BASOPHILS ABSOLUTE COUNT: 0.1 10*9/L (ref 0.0–0.1)
BASOPHILS RELATIVE PERCENT: 1 %
EOSINOPHILS ABSOLUTE COUNT: 0.2 10*9/L (ref 0.0–0.5)
EOSINOPHILS RELATIVE PERCENT: 1.9 %
HEMATOCRIT: 37.5 % (ref 34.0–44.0)
HEMOGLOBIN: 13 g/dL (ref 11.3–14.9)
LYMPHOCYTES ABSOLUTE COUNT: 2.9 10*9/L (ref 1.1–3.6)
LYMPHOCYTES RELATIVE PERCENT: 30.5 %
MEAN CORPUSCULAR HEMOGLOBIN CONC: 34.5 g/dL (ref 32.0–36.0)
MEAN CORPUSCULAR HEMOGLOBIN: 30.9 pg (ref 25.9–32.4)
MEAN CORPUSCULAR VOLUME: 89.4 fL (ref 77.6–95.7)
MEAN PLATELET VOLUME: 9.3 fL (ref 6.8–10.7)
MONOCYTES ABSOLUTE COUNT: 0.8 10*9/L (ref 0.3–0.8)
MONOCYTES RELATIVE PERCENT: 9 %
NEUTROPHILS ABSOLUTE COUNT: 5.4 10*9/L (ref 1.8–7.8)
NEUTROPHILS RELATIVE PERCENT: 57.6 %
NUCLEATED RED BLOOD CELLS: 0 /100{WBCs} (ref <=4)
PLATELET COUNT: 240 10*9/L (ref 150–450)
RED BLOOD CELL COUNT: 4.2 10*12/L (ref 3.95–5.13)
RED CELL DISTRIBUTION WIDTH: 14.6 % (ref 12.2–15.2)
WBC ADJUSTED: 9.4 10*9/L (ref 3.6–11.2)

## 2022-08-03 LAB — COMPREHENSIVE METABOLIC PANEL
ALBUMIN: 3.9 g/dL (ref 3.4–5.0)
ALKALINE PHOSPHATASE: 111 U/L (ref 46–116)
ALT (SGPT): 10 U/L (ref 10–49)
ANION GAP: 6 mmol/L (ref 5–14)
AST (SGOT): 15 U/L (ref <=34)
BILIRUBIN TOTAL: 0.6 mg/dL (ref 0.3–1.2)
BLOOD UREA NITROGEN: 17 mg/dL (ref 9–23)
BUN / CREAT RATIO: 16
CALCIUM: 9.2 mg/dL (ref 8.7–10.4)
CHLORIDE: 109 mmol/L — ABNORMAL HIGH (ref 98–107)
CO2: 23.2 mmol/L (ref 20.0–31.0)
CREATININE: 1.04 mg/dL — ABNORMAL HIGH
EGFR CKD-EPI (2021) FEMALE: 66 mL/min/{1.73_m2} (ref >=60)
GLUCOSE RANDOM: 115 mg/dL (ref 70–179)
POTASSIUM: 4.3 mmol/L (ref 3.4–4.8)
PROTEIN TOTAL: 7.5 g/dL (ref 5.7–8.2)
SODIUM: 138 mmol/L (ref 135–145)

## 2022-08-03 LAB — GREEN LITHIUM HEPARIN EXTRA TUBE: EXTRA TUBE GREEN LI HEP: 0

## 2022-08-03 LAB — HIGH SENSITIVITY TROPONIN I - 2 HOUR SERIAL
HIGH SENSITIVITY TROPONIN - DELTA (0-2H): 1 ng/L (ref <=7)
HIGH-SENSITIVITY TROPONIN I - 2 HOUR: 33 ng/L (ref <=34)

## 2022-08-03 LAB — PROTIME-INR
INR: 1.01
PROTIME: 11.2 s (ref 9.9–12.6)

## 2022-08-03 LAB — HIGH SENSITIVITY TROPONIN I - SERIAL: HIGH SENSITIVITY TROPONIN I: 34 ng/L (ref <=34)

## 2022-08-04 MED ADMIN — lactated ringers bolus 1,000 mL: 1000 mL | INTRAVENOUS | @ 01:00:00 | Stop: 2022-08-03

## 2022-08-04 MED ADMIN — iohexol (OMNIPAQUE) 350 mg iodine/mL solution 100 mL: 100 mL | INTRAVENOUS | @ 02:00:00 | Stop: 2022-08-03

## 2022-08-04 MED ADMIN — carvedilol (COREG) tablet 25 mg: 25 mg | ORAL | @ 03:00:00 | Stop: 2022-08-03

## 2022-08-04 MED ADMIN — fentaNYL (PF) (SUBLIMAZE) injection 25 mcg: 25 ug | INTRAVENOUS | @ 01:00:00 | Stop: 2022-08-03

## 2022-08-04 MED ADMIN — aluminum-magnesium hydroxide-simethicone (MAALOX MAX) 80-80-8 mg/mL oral suspension: 30 mL | ORAL | @ 03:00:00 | Stop: 2022-08-03

## 2022-08-04 MED ADMIN — sucralfate (CARAFATE) oral suspension: 1 g | ORAL | @ 03:00:00 | Stop: 2022-08-03

## 2022-08-04 MED ADMIN — acetaminophen (TYLENOL) tablet 1,000 mg: 1000 mg | ORAL | @ 03:00:00 | Stop: 2022-08-03

## 2022-08-04 NOTE — Unmapped (Signed)
Pt c/o left sided chest pain, LLQ abd pain and N/\V/D that started yesterday. Pt admit to having a bypass surg on June 2022.

## 2022-08-04 NOTE — Unmapped (Signed)
ULTRASOUND PIV PROCEDURE NOTE    Indications:   Poor venous access.    Ultrasound guidance was necessary to obtain access.     Procedure Details:  Identity of the patient was confirmed via name, medical record number and date of birth. The availability of the correct equipment was verified.    The vein was identified and measured for ultrasound catheter insertion.       Vein measurement (without tourniquet):   0.43 cm   A(n) 20 gauge 1.25 catheter was selected based on the recommendations below:    [facility] Catheter/Vein Ratio Guidelines    Chart to determine PIV catheter size/length to use based on vein diameter and depth   Catheter Gauge Size (g)  22g 20g 18g   Catheter length (inches)  1.75 1.75 1.75   Catheter diameter measurement (mm) 0.9 mm 1.1 mm 1.3 mm          Minimum required vein diameter       Sonosite (cm)  0.27 cm 0.33 cm 0.39 cm          Maximum vein depth  1.25 cm 1.25 cm 1.25 cm          The field was prepared with necessary supplies and equipment.  Probe cover and sterile gel utilized. Insertion site was prepped with chlorhexidineand allowed to dry.  The catheter extension was primed with normal saline.  The [place] PIV was placed in the R Forearm with 1attempt(s). See LDA for additional details.    Catheter aspirated, 15 mL blood return present. The catheter was then flushed with 20 mL of normal saline. Insertion site cleansed, and dressing applied per manufacturer guidelines. The catheter was inserted without difficulty by Lance Morin, RN.    Thank you,     Lance Morin, RN    Ultrasound Resource Nurse    Workup / Procedure Time:  15 minutes  \

## 2022-08-04 NOTE — Unmapped (Signed)
[facility] [place] [facility]  Emergency Department Provider Note     ED Clinical Impression     Final diagnoses:   Generalized abdominal pain (Primary)   Diarrhea, unspecified type   Chest pain, unspecified type        Impression, Medical Decision Making, ED Course     Impression: 47 y.o. female with past medical history of CAD s/p multiple Mis, PCIs, and CABG x4 in 07/2020, HFpEF, HTN, HLD, and T2DM who presents with 1 day of intermittent left-sided stabbing chest pain and intermittently sharp abdominal pain with 3 episodes of NB diarrhea as described below.     Upon evaluation in the ED, the patient is nontoxic appearing, in no acute distress. Vitals are significant for hypertension to 200/86, otherwise hemodynamically stable. Physical exam revealed diffuse abdominal tenderness without rebound, no fullness.  No lower extremity edema.  No JVD.  Cardiopulmonary exam notable for slightly decreased right radial pulse, difficult to get blood pressure on that arm.    DDx/MDM: Gastritis, gastroenteritis, ACS, arrhythmia, aortic dissection among other etiologies. Diagnostic workup as below. Plan for basic labs, hsTrop, PT-INR, EKG, CT chest/abd, and CXR. Will treat patient with fentanyl and IV fluids.  Workup was reassuring.  She is given a liter of fluids.  Blood pressure remained elevated, but she is due for her nighttime blood pressure medications.  This was ordered for her.  She is not tachycardic.  Afebrile.  Patient improved after medications of Tylenol and GI cocktail.  Did not feel that the fentanyl helped her significantly.  Did note that her blood pressure was 212/122, however on chart review it appears as though on her last visit she had a systolic in the 200s as well and has recently had changes to her blood pressure medication with discontinuation of her amlodipine as well. Will recommend that she has close follow-up with her primary care doctor, patient is aware of this and will attempt to call them in the morning.  Return precautions were provided.    Orders Placed This Encounter   Procedures    XR Chest 2 views    CTA Chest/Abd (Aortic Dissection)    CBC w/ Differential    Comprehensive Metabolic Panel    PT-INR    hsTroponin I (serial 0-2-6H w/ delta)    hsTroponin I - 2 Hour    hsTroponin I - 6 Hour    ED Extra Tubes    ECG 12 Lead    ECG 12 Lead    ECG 12 Lead       ED Course as of 08/03/22 2239   Wed Aug 03, 2022   2221 Ordered patient's nightly Coreg.       Independent Interpretation of Studies: I have independently interpreted the following studies:  CT without acute aortic dissection, no acute abdominal pathology.  Chest x-ray is reassuring.    Discussion of Management With Other Providers or Support Staff: I discussed the management of this patient with the:  None  ____________________________________    The case was discussed with the attending physician, who is in agreement with the above assessment and plan.      History     Chief Complaint  Chief Complaint   Patient presents with    Chest Pain       HPI   Deborah Bauer is a 47 y.o. female with past medical history of CAD s/p multiple Mis, PCIs, and CABG x4 in 07/2020, HFpEF, HTN, HLD, and T2DM who presents with multiple complaints. The  patient reports 1 day of intermittent left-sided stabbing chest pain as well as intermittently sharp abdominal pain with 3 episodes of NB diarrhea. She states this is different than prior MI, far less intense. She has taken Tylenol without relief. She did not eat dinner last night and states she had to pass BM immediately after eating this morning. Her PO intake is unchanged. She is also endorsing posterior headache today, however no speech changes/difficulties or unilateral weakness/numbness. Recently stopped taking her amlodipine. No history of migraines. No recent travel or sick contacts. No recent antibiotic use. No mechanical heart valves. Denies back pain, hematochezia and hematuria.     Daily medications include 25 mg coreg and 25 mg spironolactone.    Outside Historian(s): N/A    External Records Reviewed: I have reviewed 07/05/22 Cards office note for PMH.    Past Medical History:   Diagnosis Date    Anxiety     Arthritis     Cardiovascular disease     CHF (congestive heart failure) (CMS-HCC)     COPD (chronic obstructive pulmonary disease) (CMS-HCC)     Dental caries     Hypertension     Myocardial infarction (CMS-HCC) 08/03/2020    Heart attack, 3rd heart attack had two prior to this event    Type 2 diabetes mellitus (CMS-HCC)     a1c- 7.01 August 2021,      a1c of  13 in 2022       Past Surgical History:   Procedure Laterality Date    CORONARY ARTERY BYPASS GRAFT  08/18/2020         Current Facility-Administered Medications:     acetaminophen (TYLENOL) tablet 1,000 mg, 1,000 mg, Oral, Once, Areen Trautner P, MD    sucralfate (CARAFATE) oral suspension, 1 g, Oral, Once **AND** aluminum-magnesium hydroxide-simethicone (MAALOX MAX) 80-80-8 mg/mL oral suspension, 30 mL, Oral, Once, Michio Thier P, MD    carvedilol (COREG) tablet 25 mg, 25 mg, Oral, Once, Loyal Rudy, Archie Balboa, MD    Current Outpatient Medications:     acetaminophen (TYLENOL) 325 MG tablet, Take 2 tablets (650 mg total) by mouth every four (4) hours as needed., Disp: , Rfl:     albuterol HFA 90 mcg/actuation inhaler, Inhale 2 puffs., Disp: , Rfl:     albuterol sulfate 90 mcg/actuation AePB, Inhale 2 puffs every four (4) hours as needed., Disp: , Rfl:     amlodipine-atorvastatin (CADUET) 10-10 mg per tablet, Take 1 tablet by mouth daily., Disp: , Rfl:     aspirin (ECOTRIN) 81 MG tablet, Take 1 tablet (81 mg total) by mouth daily., Disp: , Rfl:     atorvastatin (LIPITOR) 80 MG tablet, Take 1 tablet (80 mg total) by mouth daily., Disp: 90 tablet, Rfl: 0    bumetanide (BUMEX) 2 MG tablet, Take 1 tablet (2 mg total) by mouth every other day., Disp: 45 tablet, Rfl: 3    carvedilol (COREG) 25 MG tablet, Take 1 tablet (25 mg total) by mouth two (2) times a day., Disp: 180 tablet, Rfl: 0 clopidogrel (PLAVIX) 75 mg tablet, Take 1 tablet (75 mg total) by mouth daily., Disp: 90 tablet, Rfl: 0    cyclobenzaprine (FLEXERIL) 5 MG tablet, Take 1 tablet (5 mg total) by mouth., Disp: , Rfl:     diclofenac sodium (VOLTAREN) 1 % gel, Apply 2 g topically., Disp: , Rfl:     empty container (SHARPS-A-GATOR DISPOSAL SYSTEM) Misc, Use as directed, Disp: 1 each, Rfl: 2    evolocumab 140  mg/mL PnIj, Inject 140 mg under the skin every fourteen (14) days for 24 doses., Disp: 10 mL, Rfl: 0    ezetimibe (ZETIA) 10 mg tablet, Take 1 tablet (10 mg total) by mouth daily., Disp: 90 tablet, Rfl: 0    flash glucose sensor kit, 1 each by Miscellaneous route., Disp: , Rfl:     fluticasone propionate (FLONASE) 50 mcg/actuation nasal spray, 2 sprays into each nostril., Disp: , Rfl:     hydrOXYzine (ATARAX) 25 MG tablet, Take 0.5 tablets (12.5 mg total) by mouth., Disp: , Rfl:     insulin glargine (LANTUS) 100 unit/mL injection, Inject 0.02 mL (2 Units total) under the skin nightly., Disp: , Rfl:     insulin lispro (HUMALOG) 100 unit/mL injection, Inject 0.04 mL (4 Units total) under the skin Three (3) times a day before meals. 4 units before meals, Disp: , Rfl:     ondansetron (ZOFRAN) 8 MG tablet, , Disp: , Rfl:     ranolazine (RANEXA) 500 MG 12 hr tablet, Take 1 tablet (500 mg total) by mouth two (2) times a day., Disp: 180 tablet, Rfl: 3    semaglutide 3 mg Tab, Take 3 mg by mouth. rybelsus, Disp: , Rfl:     spironolactone (ALDACTONE) 25 MG tablet, Take 1 tablet (25 mg total) by mouth daily., Disp: 90 tablet, Rfl: 3    suvorexant (BELSOMRA) 5 mg tablet, Take 1 tablet (5 mg total) by mouth daily as needed. .5, Disp: , Rfl:     vilazodone (VIIBRYD) 20 mg tablet, Take 1 tablet (20 mg total) by mouth daily., Disp: , Rfl:     Allergies  Pantoprazole, Tramadol, Jardiance [empagliflozin], Compazine [prochlorperazine], Hydralazine, Lisinopril, Nitroglycerin, Reglan [metoclopramide hcl], Toradol [ketorolac], and Adhesive tape-silicones    Family History  Family History   Problem Relation Age of Onset    Heart disease Mother     Heart disease Father        Social History  Social History     Tobacco Use    Smoking status: Former     Current packs/day: 0.00     Types: Cigarettes     Quit date: 2010     Years since quitting: 14.4    Smokeless tobacco: Never   Substance Use Topics    Alcohol use: Never    Drug use: Never        Physical Exam     VITAL SIGNS:      Vitals:    08/03/22 1912 08/03/22 1928 08/03/22 2200 08/03/22 2201   BP:  200/86 212/122    Pulse:  62 67 80   Resp:  14 26 17    Temp: 36.8 ??C (98.2 ??F)      TempSrc:       SpO2:  99% 99% 99%   Weight:       Height:           Constitutional: Alert and oriented. No acute distress.  Eyes: Conjunctivae are normal.  HEENT: Normocephalic and atraumatic. Conjunctivae clear. No congestion. Moist mucous membranes.   Cardiovascular: Rate as above, regular rhythm. Normal and symmetric distal pulses. Brisk capillary refill. Normal skin turgor.  Respiratory: Normal respiratory effort. Breath sounds are normal. There are no wheezing or crackles heard.  Gastrointestinal: Soft, non-distended, mildly tender to palpation diffusely without guarding  Genitourinary: Deferred.  Musculoskeletal: Non-tender with normal range of motion in all extremities.  Neurologic: Normal speech and language. No gross focal neurologic deficits are appreciated. Patient is moving  all extremities equally, face is symmetric at rest and with speech.  Skin: Skin is warm, dry and intact. No rash noted.  Psychiatric: Mood and affect are normal. Speech and behavior are normal.     Radiology     CTA Chest/Abd (Aortic Dissection)   Final Result   No evidence of acute aortic syndrome.               XR Chest 2 views   Final Result      No acute abnormality.          Pertinent labs & imaging results that were available during my care of the patient were independently interpreted by me and considered in my medical decision making (see chart for details).    Portions of this record have been created using Scientist, clinical (histocompatibility and immunogenetics). Dictation errors have been sought, but may not have been identified and corrected.      Documentation assistance was provided by Kela Millin, Scribe, on August 03, 2022 at 8:50 PM for Melvern Sample, MD  A scribe was used when documenting this visit. I agree with the above documentation. Signed by  Leslye Peer, MD on  August 03, 2022 at 10:34 PM        Leslye Peer, MD  Resident  08/03/22 [id number]

## 2022-08-04 NOTE — Unmapped (Signed)
Discharge instructions explained to patient. Pt verbalized understanding. IV removed. Pt hypertensive but MD aware. Medication administered. Ambulatory to discharge.

## 2022-08-21 MED ORDER — ATORVASTATIN 80 MG TABLET
ORAL_TABLET | ORAL | 0 refills | 0 days
Start: 2022-08-21 — End: ?

## 2022-08-21 MED ORDER — CARVEDILOL 25 MG TABLET
ORAL_TABLET | Freq: Two times a day (BID) | ORAL | 0 refills | 0 days
Start: 2022-08-21 — End: ?

## 2022-08-22 MED ORDER — ATORVASTATIN 80 MG TABLET
ORAL_TABLET | ORAL | 0 refills | 0 days | Status: CP
Start: 2022-08-22 — End: ?

## 2022-08-22 MED ORDER — CARVEDILOL 25 MG TABLET
ORAL_TABLET | Freq: Two times a day (BID) | ORAL | 0 refills | 90 days | Status: CP
Start: 2022-08-22 — End: ?

## 2022-08-29 MED ORDER — AMLODIPINE 10 MG TABLET
ORAL_TABLET | Freq: Every day | ORAL | 3 refills | 90 days | Status: CP
Start: 2022-08-29 — End: 2023-08-29

## 2022-08-29 NOTE — Unmapped (Signed)
Pt called to state the spironolactone is causing her vomit and have a real bad headache. Pt wants to know if she can stop the spironolactone and go back on the amlodipine. Per Dr. Nedra Hai, that is fine to do, but will want to follow up with her at the end of July to check on her. Call transferred to front desk to schedule.    Dellie Catholic, RN

## 2022-09-07 ENCOUNTER — Ambulatory Visit
Admit: 2022-09-07 | Discharge: 2022-09-08 | Payer: MEDICARE | Attending: Student in an Organized Health Care Education/Training Program | Primary: Student in an Organized Health Care Education/Training Program

## 2022-09-07 DIAGNOSIS — R5383 Other fatigue: Principal | ICD-10-CM

## 2022-09-07 DIAGNOSIS — I252 Old myocardial infarction: Principal | ICD-10-CM

## 2022-09-07 DIAGNOSIS — M75102 Unspecified rotator cuff tear or rupture of left shoulder, not specified as traumatic: Principal | ICD-10-CM

## 2022-09-07 DIAGNOSIS — Z1211 Encounter for screening for malignant neoplasm of colon: Principal | ICD-10-CM

## 2022-09-07 DIAGNOSIS — R110 Nausea: Principal | ICD-10-CM

## 2022-09-07 DIAGNOSIS — E1100 Type 2 diabetes mellitus with hyperosmolarity without nonketotic hyperglycemic-hyperosmolar coma (NKHHC): Principal | ICD-10-CM

## 2022-09-07 DIAGNOSIS — R7989 Other specified abnormal findings of blood chemistry: Principal | ICD-10-CM

## 2022-09-07 DIAGNOSIS — E559 Vitamin D deficiency, unspecified: Principal | ICD-10-CM

## 2022-09-07 LAB — BASIC METABOLIC PANEL
ANION GAP: 2 mmol/L — ABNORMAL LOW (ref 5–14)
BLOOD UREA NITROGEN: 26 mg/dL — ABNORMAL HIGH (ref 9–23)
BUN / CREAT RATIO: 24
CALCIUM: 9.6 mg/dL (ref 8.7–10.4)
CHLORIDE: 110 mmol/L — ABNORMAL HIGH (ref 98–107)
CO2: 25 mmol/L (ref 20.0–31.0)
CREATININE: 1.09 mg/dL — ABNORMAL HIGH
EGFR CKD-EPI (2021) FEMALE: 62 mL/min/{1.73_m2} (ref >=60)
GLUCOSE RANDOM: 132 mg/dL — ABNORMAL HIGH (ref 70–99)
POTASSIUM: 4.6 mmol/L (ref 3.4–4.8)
SODIUM: 137 mmol/L (ref 135–145)

## 2022-09-07 LAB — TSH: THYROID STIMULATING HORMONE: 0.747 u[IU]/mL (ref 0.550–4.780)

## 2022-09-07 LAB — MAGNESIUM: MAGNESIUM: 2 mg/dL (ref 1.6–2.6)

## 2022-09-07 NOTE — Unmapped (Signed)
Assessment and Plan:     47 yo female presents today to establish care       Type 2 diabetes mellitus with hyperosmolarity without coma, without long-term current use of insulin (CMS-HCC), Obesity Class III  -Chronic. Diagnosed in 2014, started on insulin around 2018.   - POCT glycosylated hemoglobin (Hb A1C) was found to be 7.0%  -She is currently taking Lantus 2.5U at night and Humalog with meals. Additionally she is taking Rybelsus 3mg  every day. However, appears that she might have developed ADE with Rybelsus  -Previously tried but did not tolerate: Metformin (GI side effects), Jardiance (Yeast infections), Trulicity (Nausea/vomiting)  -Given her A1C is at 7.0% and concern that she is developing ADE with Rybelsus, used shared decision making and will stop Rybelsus and monitor BG at home while taking insulin.  -Discussed next medication that I would recommend starting for diabetes control would be: DPP4-inhibitors. She will let me know if she wishes to start this prior to next visit.   -We are unable to use Pioglitazone due to hx of HF. Could consider sulfonylureas, however would need to stop insulin to decrease risk of hypoglycemia   -Will perform foot exam next visit  -She reports last exam was in 03/2022 and denies diabetic retinopathy. Continue annual exam  -Urine Microalbumin/Creatinine ratio due 02/16/2023  -A1C goal <7.0%. 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 Nedra Hai  -ACEi/ARB/ARNI: not currently on---she developed cough with Lisinopril previously.   -BB: Carvedilol 25mg  BID  -SGLT2i: not taking as developed ADE with medication (Yeast infections)   -Spirolactone? Yes, Spironactone 25mg  every day   -Diuretics: Bumex 2mg  every other day  -Additional Anti-hypertensive: Amlodipine 10mg  qd  -Lipid lowering agent: Atorvastatin 80mg  every day, Zeita 10mg  every day, Evolocumab 140mg  SubQ every 14 days  -Anti-platelet therapy: Aspirin 81mg  every day, Clopidogrel 75mg  every day   -For chronic angina: Currently taking Ranexa 500mg  BID  -BP goal per ADA and ACC/AHA of <130/80    Convulsions, CVA  -Hospitalized in 10/30/2012    Nontraumatic tear of left rotator cuff, unspecified tear extent  -Evaluated by Gaylord Shih [facility]) on 06/23/2022 who recommended surgery. She would like to avoid surgery if possible.   -She was re-evaluated by [facility] on 07/14/2022 where it was recommended to receive corticosteroid injection and to undergo PT and pain management.   -She should NOT take NSAIDs given her cardiac history. Current pain regimen: Tylenol 650mg  q4 hrs PRN. She is no longer taking flexiril or using topical anti-inflammatories it appears.     Nausea  -acute on chronic history it appears.   -She went to the ER on 08/03/2022 where she had a CTA Chest/abdomen obtained which did not reveal any gross abnormalities outside of hepatomegaly and fat containing umbilical hernia   -We will stop Rybelsus to see if this is the cause of her nausea  -She did see GI on 05/12/2021 for similar symptoms as well where it was recommended to have colonoscopy obtained. This unfortunately was not done. However she is agreeable to have referral placed today  -On chart review, she appeared to have had EGD in 2009 that revealed H. Pylori present.   -Okay to continue Zofran for symptom management. However discussed importance to try to find cause to her nausea as well. We will discuss obtaining EGD and gastric emptying study for further  investigation as well    Bipolar disorder, Insomnia  -Currently prescibed Suvorexant 5mg  daily PRN, Viibryd 20mg  every day        HEALTH MAINTENANCE   -Mammogram BI-RADS 1 on 05/04/2022. Repeat 05/04/2023  -Hysterectomy in 2018. She reports that her previous OBGYN said she did not need a pap smear until she was sexually active.   -She has not undergone CRC screening. She is agreeable for colonoscopy. Colon cancer screening: Order placed   -     Colonoscopy; Future  -Vitamin D deficiency: obtaining Vitamin D 25 Hydroxy (25OH D2 + D3)  -Fatigue, unspecified type: obtaining TSH  -Elevated serum creatinine: obtaining  Basic Metabolic Panel  -Hypomagnesemia: obtaining Magnesium Level  -She is a former smoker, quit in 2011. Will need to confirm smoking history as she may qualify for lung cancer screening at age 37      Advised to follow up in 1 month    I personally spent >60 minutes face-to-face and non-face-to-face in the care of this patient, which includes all pre, intra, and post visit time on the date of service.  All documented time was specific to the E/M visit and does not include any procedures that may have been performed. Significant amount of time was spent reviewing records from [place], [facility] and [facility]          Subjective:     Deborah Bauer 47 y.o.female  is here to establish care   Her mother is from [facility]    Moved from [place] 2 years ago   She had CABG before this move   She had 2 MI before the surgery     She has 2 daughters and a son who passed away ( on her birthday 09/03/19)  She has 1 granddaughter who is 8yo     She lives with her oldest daughter right now and will be moving from [place] to [place] in September     She notes she has been having nausea and sensitivity to smells   She states that when she switched from amlodipine to spirolactone this caused an issue   She does believe the headaches and nausea have been more severe since taking the spirolactone.   She checks her BG and BP when this happens which she notes is normal     She is checking AM BG which is usually 80-90 and then will check 3 times a day     Hysterectomy: in 2018     She does feel safe at home     She is not smoking marijuana or tobacco use     She drinks a lot of water, does not eat sweets     FH of   -breast cancer   -Pancreatic cancer     FH of CVA  -Mother passed from MI 24 years (in 18)  -Father passed from MI 5 years ago (in 2019)    No LMP recorded. Patient has had a hysterectomy.    She does see an eye doctor. She reports no diabetic retinopathy, in 03/2022.       Colonoscopy/EGD 07/12/2007:   FINAL PATHOLOGIC DIAGNOSIS    1.     Cecum, biopsy:           Colonic mucosa with non-specific inflammation.  2.     Gastric, biopsy:           Gastric mucosa with mild to moderate inflammation.  Special stain (H. pylori):  Positive for structures consistent  with Helicobacter; controls                appropriate.  3.     Polyp at 25 cm, biopsy:           Adenoma with moderate atypia.  4.     Duodenum, biopsy:           Acute and chronic duodenitis with focal surface erosion.  5.     Rectum, biopsy:           Benign rectal mucosa with non-specific inflammation.       ROS:     ROS negative unless otherwise noted in HPI    Vital Signs:     Wt Readings from Last 3 Encounters:   09/07/22 (!) 109.3 kg (241 lb)   08/03/22 (!) 106.6 kg (235 lb)   07/05/22 (!) 107.4 kg (236 lb 11.2 oz)     Temp Readings from Last 3 Encounters:   09/07/22 36.3 ??C (97.3 ??F) (Temporal)   08/03/22 36.4 ??C (97.5 ??F) (Oral)   07/05/22 36.9 ??C (98.4 ??F)     BP Readings from Last 3 Encounters:   09/07/22 118/90   08/03/22 212/122   07/05/22 146/74     Pulse Readings from Last 3 Encounters:   09/07/22 60   08/03/22 80   07/05/22 56       Objective:     General Appearance: Alert, cooperative, in no acute distress  Head and Neck: Normocephalic, atraumatic.   EENT: Wearing glasses, EOMI  Chest: surgical scar on chest.   Respiratory:  Respiratory effort unremarkable. No cough or audible wheeze. No conversational dyspnea noted  Musculoskeletal: Gait and station unremarkable.   Integumentary: Warm and dry. Tattoos present on upper extremities   Neurologic: AOX3 CN II-XII grossly intact.   Psychiatric: Mood and affect appropriate for situation.

## 2022-09-08 LAB — VITAMIN D 25 HYDROXY: VITAMIN D, TOTAL (25OH): 9.8 ng/mL — ABNORMAL LOW (ref 20.0–80.0)

## 2022-09-08 NOTE — Unmapped (Signed)
Called pt and made aware. She will pick up on Monday

## 2022-09-08 NOTE — Unmapped (Signed)
The patient is requesting for the Nurse to contact them in regards to  wanting to know if Dr. Rosine Beat will fill out the form for her to have the permanent handicap placecard. Patient says Dr. Rosine Beat filled out the form yesterday but it was only for the 6 month place card  Please contact The patient by Cell Phone

## 2022-09-08 NOTE — Unmapped (Signed)
Pt states the form came from office . Would like to pick the new one up tomorrow

## 2022-09-11 DIAGNOSIS — E559 Vitamin D deficiency, unspecified: Principal | ICD-10-CM

## 2022-09-11 MED ORDER — ERGOCALCIFEROL (VITAMIN D2) 1,250 MCG (50,000 UNIT) CAPSULE
ORAL_CAPSULE | ORAL | 0 refills | 84 days | Status: CP
Start: 2022-09-11 — End: 2022-11-28

## 2022-09-15 NOTE — Unmapped (Signed)
Form is ready for pick up. Pt is aware

## 2022-09-15 NOTE — Unmapped (Signed)
Pt daughter came in to pick up an handicap form. I do not see form filled out on providers desk please advise.

## 2022-09-19 IMAGING — CT CT ANGIO CHEST
2 of 7 series · 18 of 46 positions shown · IV contrast (APPLIED)
Comparison: 11/18/2020.  Current chest radiograph.

CLINICAL DATA: Left-sided chest pain radiating to the jaw beginning
this morning. Also shortness of breath.

EXAM:
CT ANGIOGRAPHY CHEST WITH CONTRAST
TECHNIQUE: Multidetector CT imaging of the chest was performed using the
standard protocol during bolus administration of intravenous
contrast. Multiplanar CT image reconstructions and MIPs were
obtained to evaluate the vascular anatomy.

[Series 6: thins · axial · 0.67mm/px · z∈[-603,-387]mm · 15 of 301 slices shown]
[im 16/301  lung]
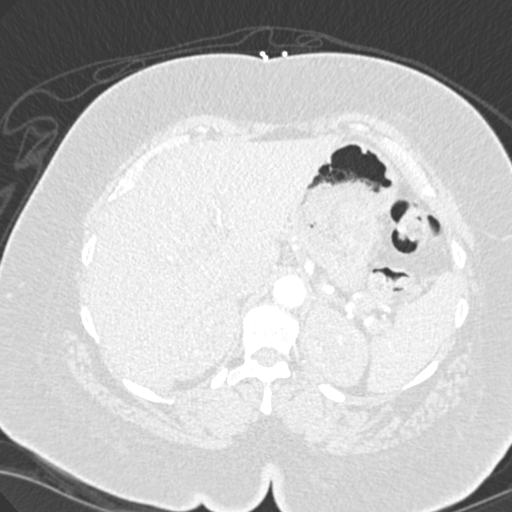
[im 31/301  soft-tissue]
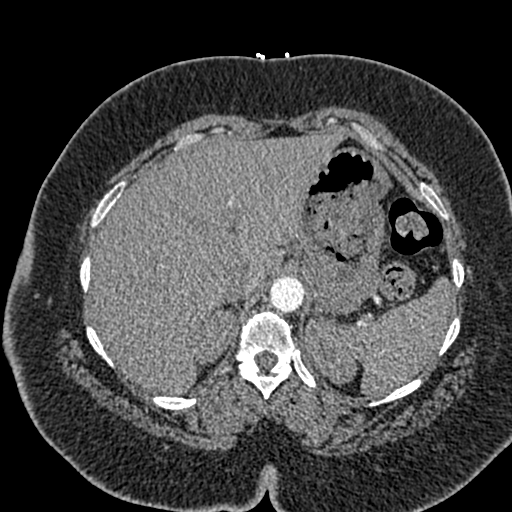
[im 61/301  lung]
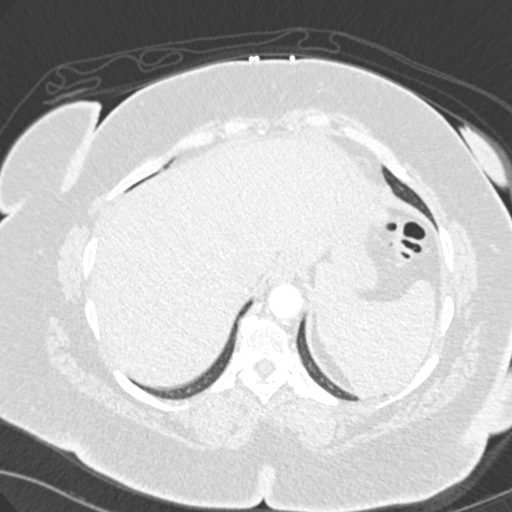
[im 76/301  soft-tissue]
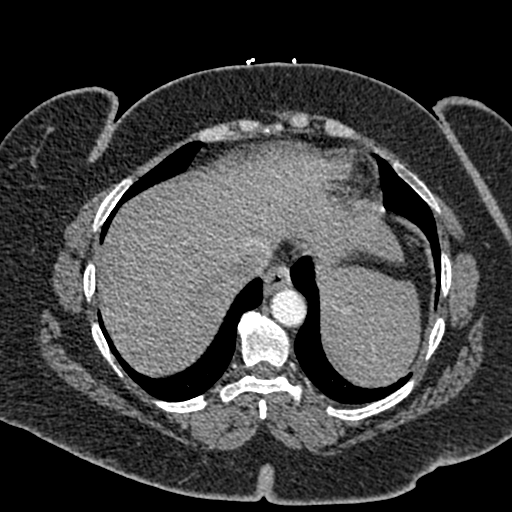
[im 91/301  lung]
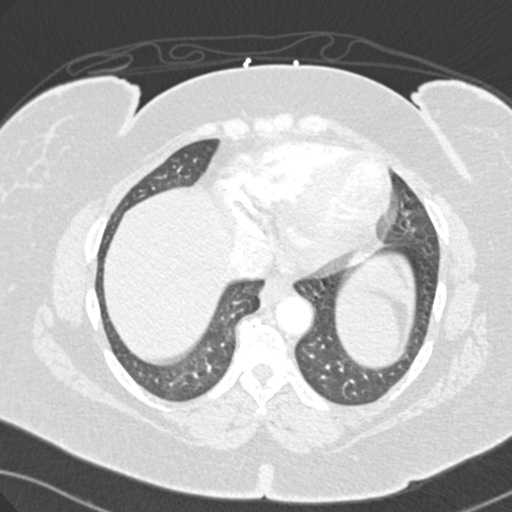
[im 106/301  soft-tissue]
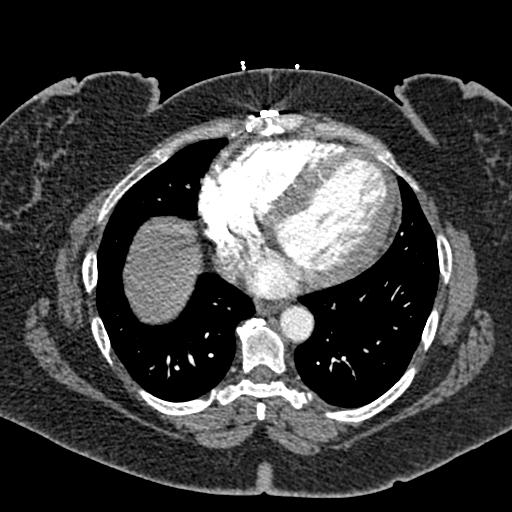
[im 136/301  lung]
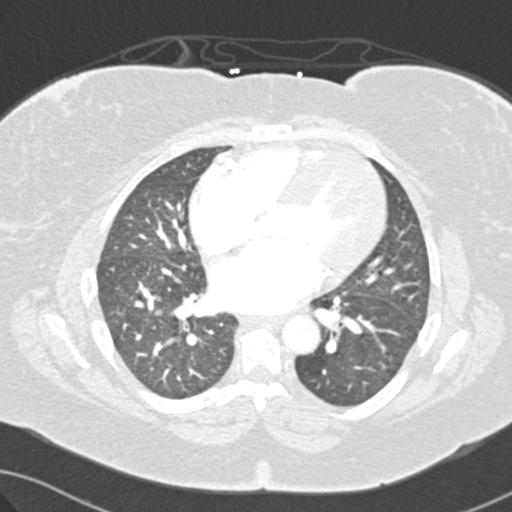
[im 151/301  soft-tissue]
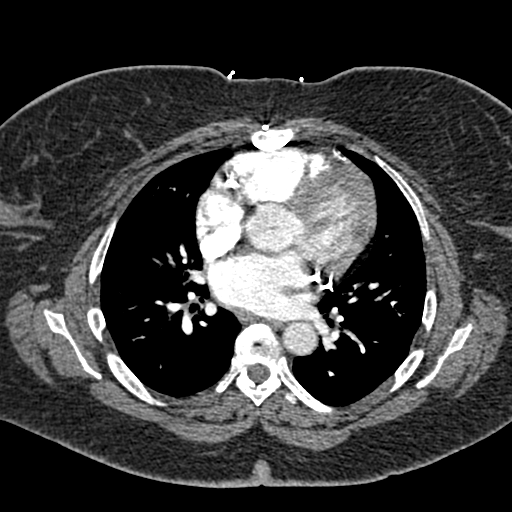
[im 166/301  lung]
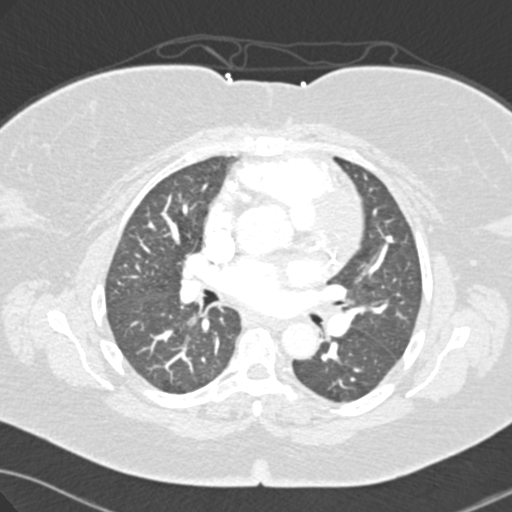
[im 196/301  soft-tissue]
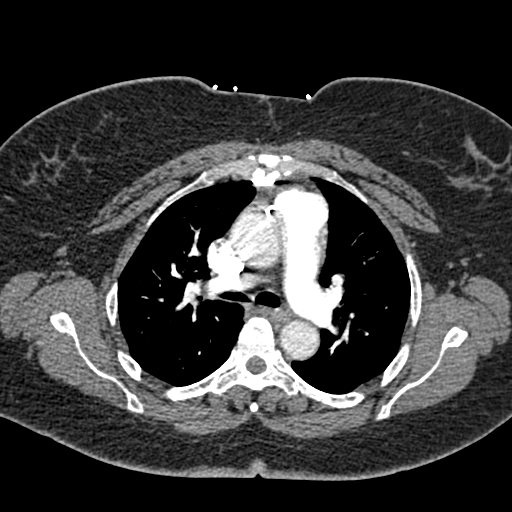
[im 211/301  lung]
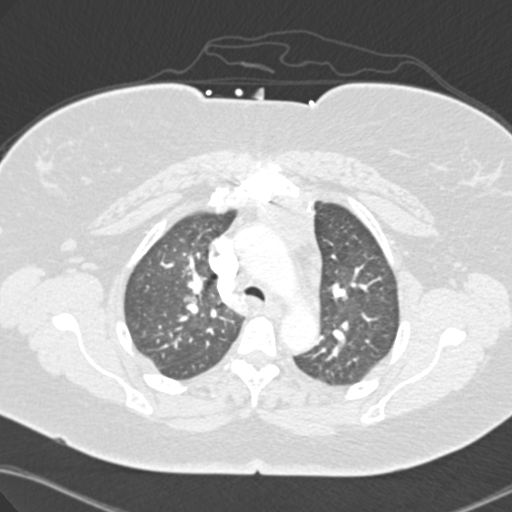
[im 226/301  soft-tissue]
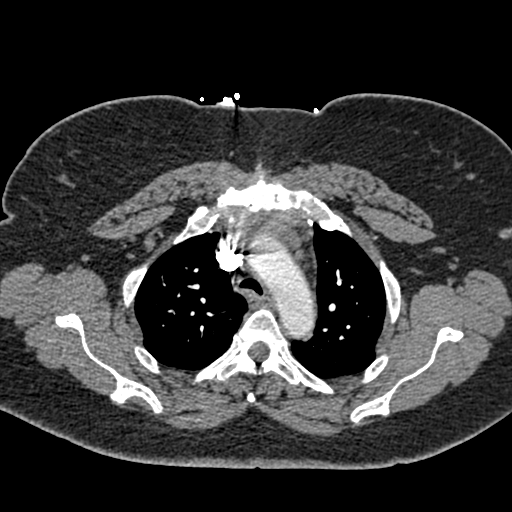
[im 241/301  lung]
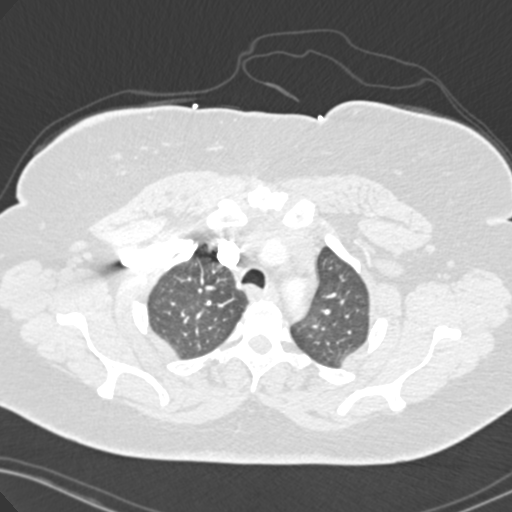
[im 271/301  soft-tissue]
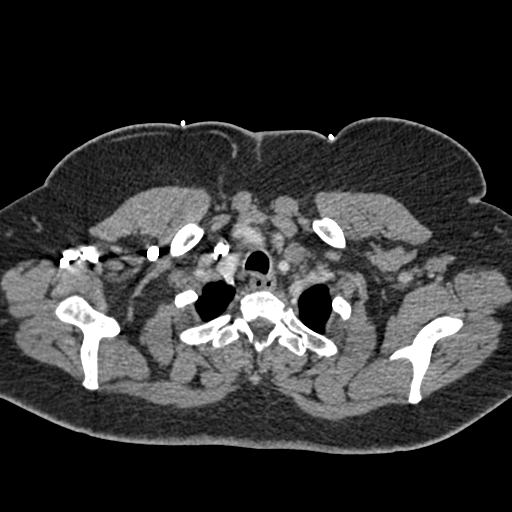
[im 286/301  lung]
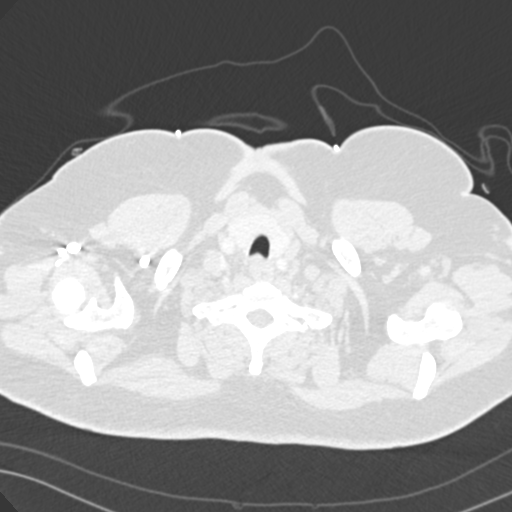

[Series 8: coronal mpr · coronal · 0.51mm/px · 3 of 106 slices shown]
[im 27/106  soft-tissue]
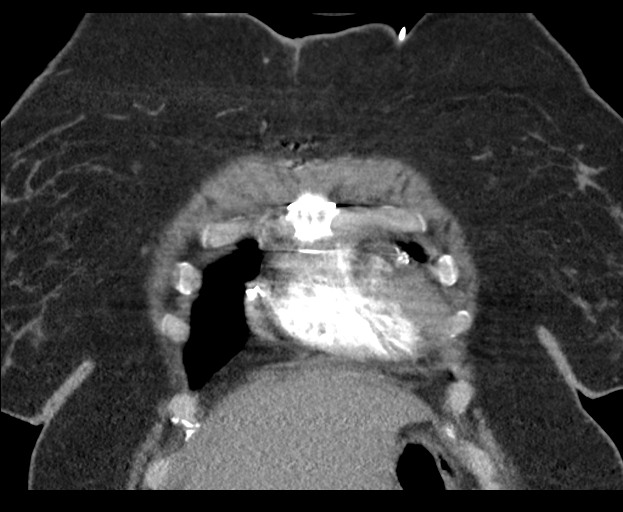
[im 53/106  soft-tissue]
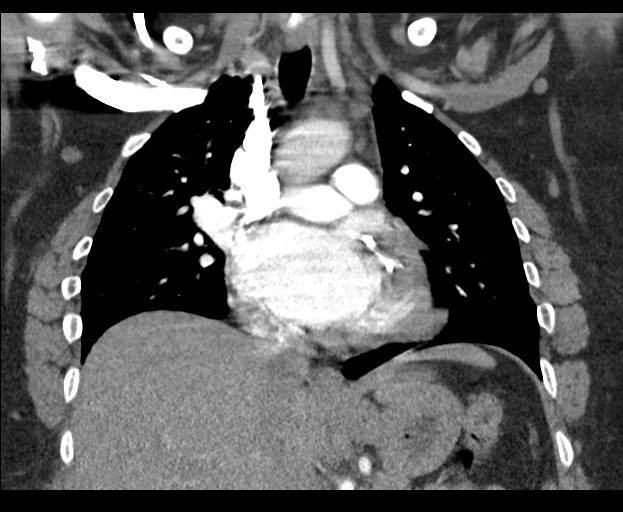
[im 79/106  soft-tissue]
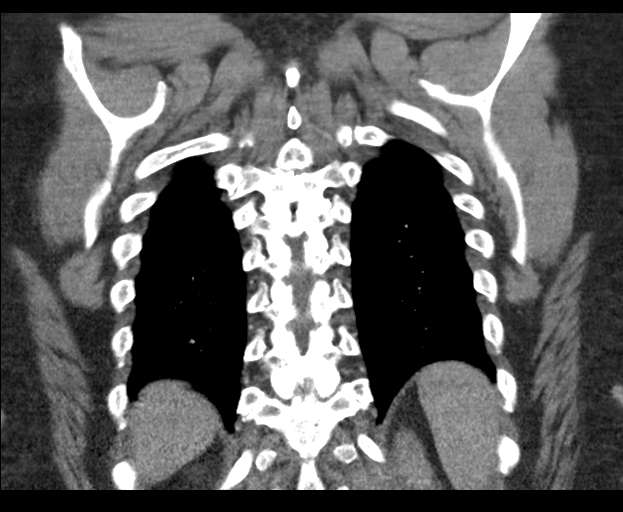

[18 of 46 positions shown; findings below may reference images not displayed]

RADIATION DOSE REDUCTION: This exam was performed according to the
departmental dose-optimization program which includes automated
exposure control, adjustment of the mA and/or kV according to
patient size and/or use of iterative reconstruction technique.

CONTRAST:  100mL OMNIPAQUE IOHEXOL 350 MG/ML SOLN
FINDINGS: Cardiovascular: Pulmonary arteries are well opacified. There is no
evidence of a pulmonary embolism.

Heart normal in size and configuration. Previous CABG surgery.
Coronary artery calcifications. No pericardial effusion. Great
vessels are normal in caliber. No aortic dissection or
atherosclerosis.

Mediastinum/Nodes: Normal visualized thyroid. No neck base,
mediastinal or hilar masses or enlarged lymph nodes. Trachea and
esophagus are unremarkable.

Lungs/Pleura: Lungs are clear. No pleural effusion or pneumothorax.

Upper Abdomen: Unremarkable.

Musculoskeletal: No fracture or acute finding. No bone lesion. No
chest wall masses.

Review of the MIP images confirms the above findings.
IMPRESSION: 1. No evidence of a pulmonary embolism.
2. No acute findings.  Clear lungs.

## 2022-09-19 IMAGING — CR DG CHEST 2V
2 series · 2 of 2 positions shown · non-contrast
Comparison: 03/18/2021.

CLINICAL DATA: Left-sided chest pain radiating to the right jaw
beginning this morning.

EXAM:
CHEST - 2 VIEW

[chest pa]
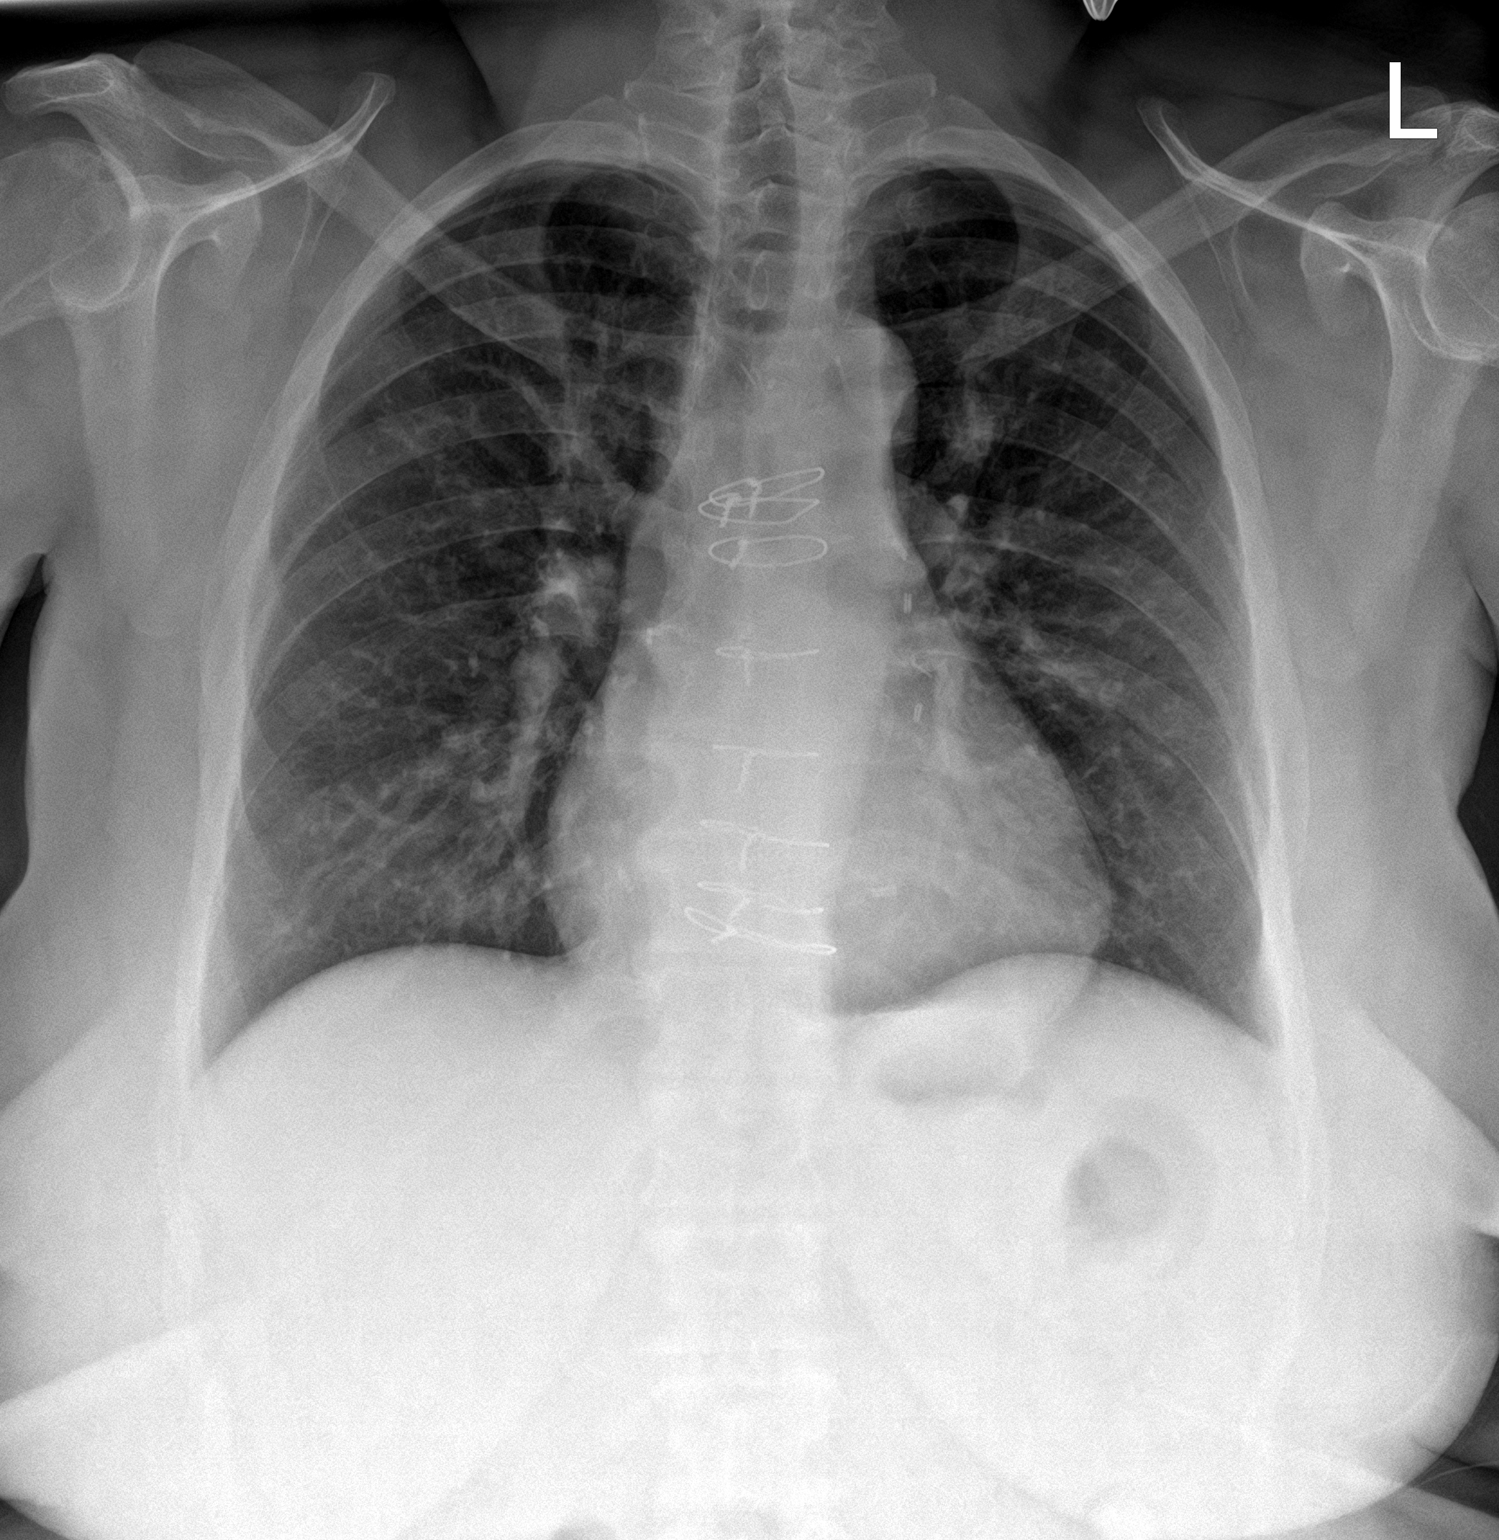

[chest lat]
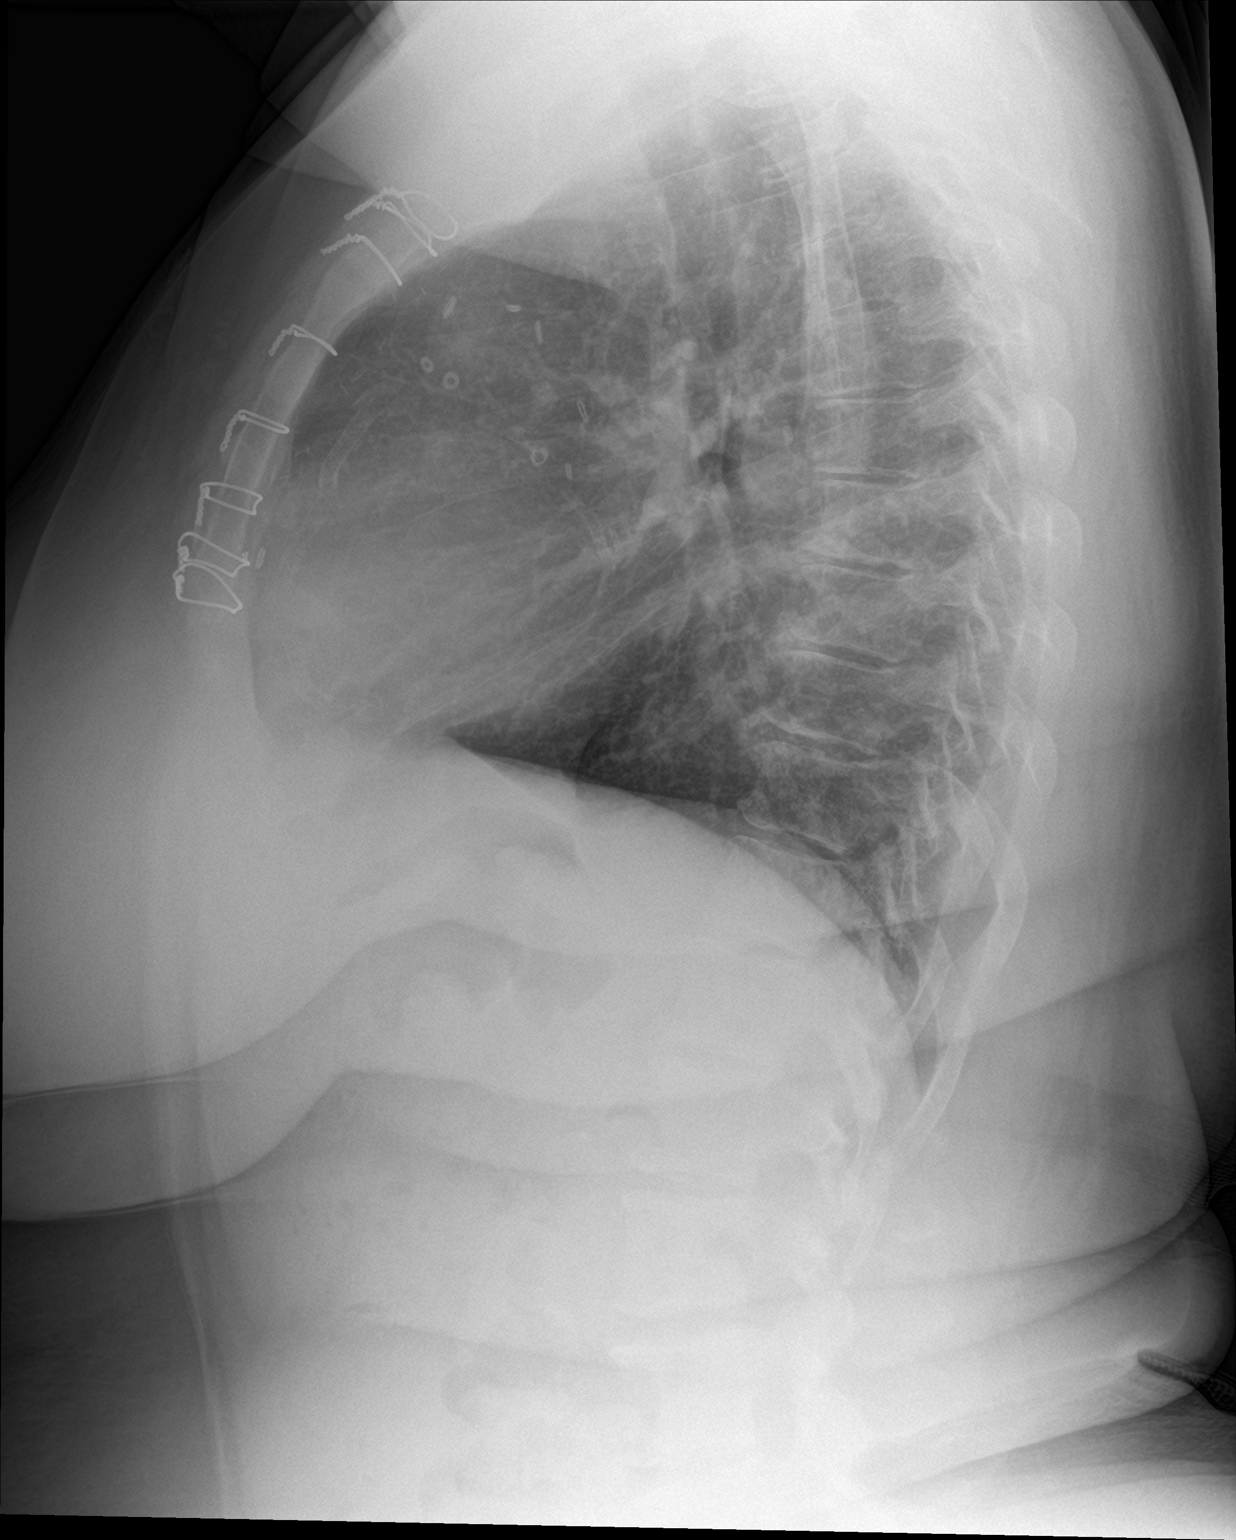

[2 of 2 positions shown; findings below may reference images not displayed]

FINDINGS: Stable changes from prior CABG surgery. Cardiac silhouette is normal
in size and configuration. Normal mediastinal and hilar contours.

Clear lungs.  No pleural effusion or pneumothorax.

Skeletal structures are intact.
IMPRESSION: No active cardiopulmonary disease.

## 2022-09-26 DIAGNOSIS — Z9861 Coronary angioplasty status: Principal | ICD-10-CM

## 2022-09-26 DIAGNOSIS — I252 Old myocardial infarction: Principal | ICD-10-CM

## 2022-09-26 DIAGNOSIS — Z951 Presence of aortocoronary bypass graft: Principal | ICD-10-CM

## 2022-09-26 MED ORDER — REPATHA SURECLICK 140 MG/ML SUBCUTANEOUS PEN INJECTOR
SUBCUTANEOUS | 0 refills | 140 days
Start: 2022-09-26 — End: 2023-08-15

## 2022-09-26 NOTE — Unmapped (Signed)
[facility] Refill Coordination Note    Specialty Lite Medication(s) to be Shipped:   Repatha    Other medication(s) to be shipped: No additional medications requested for fill at this time     Deborah Bauer, DOB: [birth year]  Phone: [phone] (home)       All above HIPAA information was verified with patient.     Was a Nurse, learning disability used for this call? No    Changes to medications: [name] reports no changes at this time.  Changes to insurance: No      REFERRAL TO PHARMACIST     Referral to the pharmacist: Not needed      [facility]     Shipping address confirmed in Epic.     Delivery Scheduled: Yes, Expected medication delivery date: 10/04/22.     Medication will be delivered via Same Day Courier to the prescription address in Epic WAM.    [name]   [facility] Specialty Technician

## 2022-09-27 MED ORDER — REPATHA SURECLICK 140 MG/ML SUBCUTANEOUS PEN INJECTOR
SUBCUTANEOUS | 0 refills | 140 days | Status: CP
Start: 2022-09-27 — End: 2023-08-16
  Filled 2022-10-04: qty 6, 84d supply, fill #0

## 2022-10-01 MED ORDER — EZETIMIBE 10 MG TABLET
ORAL_TABLET | Freq: Every day | ORAL | 0 refills | 0 days
Start: 2022-10-01 — End: ?

## 2022-10-03 MED ORDER — EZETIMIBE 10 MG TABLET
ORAL_TABLET | Freq: Every day | ORAL | 0 refills | 90 days | Status: CP
Start: 2022-10-03 — End: ?

## 2022-10-13 ENCOUNTER — Ambulatory Visit
Admit: 2022-10-13 | Payer: MEDICARE | Attending: Student in an Organized Health Care Education/Training Program | Primary: Student in an Organized Health Care Education/Training Program

## 2022-10-19 ENCOUNTER — Other Ambulatory Visit: Payer: Self-pay

## 2022-10-19 ENCOUNTER — Emergency Department
Admission: EM | Admit: 2022-10-19 | Discharge: 2022-10-19 | Disposition: A | Discharge status: Home / Self Care | Payer: 59 | Attending: Emergency Medicine | Admitting: Emergency Medicine

## 2022-10-19 ENCOUNTER — Encounter: Payer: Self-pay | Admitting: *Deleted

## 2022-10-19 DIAGNOSIS — M25511 Pain in right shoulder: Secondary | ICD-10-CM | POA: Insufficient documentation

## 2022-10-19 DIAGNOSIS — G8929 Other chronic pain: Secondary | ICD-10-CM | POA: Diagnosis not present

## 2022-10-19 DIAGNOSIS — M545 Low back pain, unspecified: Secondary | ICD-10-CM | POA: Diagnosis present

## 2022-10-19 DIAGNOSIS — M25512 Pain in left shoulder: Secondary | ICD-10-CM | POA: Insufficient documentation

## 2022-10-19 MED ORDER — METHOCARBAMOL 500 MG PO TABS: 500.0000 mg | ORAL_TABLET | Freq: Three times a day (TID) | ORAL | 1 refills | Status: DC | PRN

## 2022-10-19 MED ORDER — OXYCODONE-ACETAMINOPHEN 5-325 MG PO TABS: 1.0000 | ORAL_TABLET | Freq: Three times a day (TID) | ORAL | 0 refills | Status: DC | PRN

## 2022-10-19 MED ORDER — METHOCARBAMOL 500 MG PO TABS
500.0000 mg | ORAL_TABLET | Freq: Once | ORAL | Status: AC
  Administered 2022-10-19: 500 mg via ORAL
  Filled 2022-10-19: qty 1

## 2022-10-19 MED ORDER — OXYCODONE-ACETAMINOPHEN 5-325 MG PO TABS
1.0000 | ORAL_TABLET | Freq: Once | ORAL | Status: AC
  Administered 2022-10-19: 1 via ORAL
  Filled 2022-10-19: qty 1

## 2022-10-19 NOTE — ED Provider Notes (Signed)
[facility] Provider Note    Event Date/Time   First MD Initiated Contact with Patient 10/19/22 2151     (approximate)   History   Back Pain   HPI  Kayla Caldwell is a 47 y.o. female who presents with complaints of acute on chronic back pain.  Patient reports he typically gets injections in her shoulders as well as back but she is not due for them.  She is having pain in her low back primarily which is making it difficult for her to sleep.  No weakness in the lower extremities, no urinary incontinence, no fevers     Physical Exam   Triage Vital Signs: ED Triage Vitals  Encounter Vitals Group     BP 10/19/22 2138 (!) 173/102     Systolic BP Percentile --      Diastolic BP Percentile --      Pulse Rate 10/19/22 2138 91     Resp 10/19/22 2138 18     Temp 10/19/22 2138 98.3 F (36.8 C)     Temp Source 10/19/22 2138 Oral     SpO2 10/19/22 2138 96 %     Weight --      Height --      Head Circumference --      Peak Flow --      Pain Score 10/19/22 2136 9     Pain Loc --      Pain Education --      Exclude from Growth Chart --     Most recent vital signs: Vitals:   10/19/22 2138  BP: (!) 173/102  Pulse: 91  Resp: 18  Temp: 98.3 F (36.8 C)  SpO2: 96%     General: Awake, no distress.  CV:  Good peripheral perfusion.  Resp:  Normal effort.  Abd:  No distention.  Other:  Back: No no vertebral tenderness palpation, no CVA tenderness, mild tenderness over the shoulders bilaterally where she has known lipomas   ED Results / Procedures / Treatments   Labs (all labs ordered are listed, but only abnormal results are displayed) Labs Reviewed - No data to display   EKG     RADIOLOGY     PROCEDURES:  Critical Care performed:   Procedures   MEDICATIONS ORDERED IN ED: Medications  oxyCODONE-acetaminophen (PERCOCET/ROXICET) 5-325 MG per tablet 1 tablet (has no administration in time range)  methocarbamol (ROBAXIN)  tablet 500 mg (has no administration in time range)     IMPRESSION / MDM / ASSESSMENT AND PLAN / ED COURSE  I reviewed the triage vital signs and the nursing notes. Patient's presentation is most consistent with exacerbation of chronic illness.  Patient with exacerbation of chronic back pain.  Will treat with analgesics here to help her rest, muscle relaxer provided, outpatient follow-up recommended, no indication for further workup at this time.        FINAL CLINICAL IMPRESSION(S) / ED DIAGNOSES   Final diagnoses:  Chronic midline low back pain, unspecified whether sciatica present     Rx / DC Orders   ED Discharge Orders          Ordered    oxyCODONE-acetaminophen (PERCOCET) 5-325 MG tablet  Caldwell 8 hours PRN        10/19/22 2201    methocarbamol (ROBAXIN) 500 MG tablet  Caldwell 8 hours PRN        10/19/22 2201             Note:  This document was prepared using Dragon voice recognition software and may include unintentional dictation errors.   Kayla Every, MD 10/19/22 2206

## 2022-10-19 NOTE — ED Triage Notes (Signed)
Pt ambulatory to triage, reports 2 days of lower back pain, pain goes down her legs, no numbness or tingling. Also c/o pain in the left shoulder from a lipoma and now pain in the right arm from arthritis.

## 2022-11-03 ENCOUNTER — Emergency Department: Payer: 59

## 2022-11-03 ENCOUNTER — Encounter: Payer: Self-pay | Admitting: Emergency Medicine

## 2022-11-03 ENCOUNTER — Other Ambulatory Visit: Payer: Self-pay

## 2022-11-03 ENCOUNTER — Emergency Department
Admission: EM | Admit: 2022-11-03 | Discharge: 2022-11-03 | Disposition: A | Discharge status: Home / Self Care | Payer: 59 | Attending: Emergency Medicine | Admitting: Emergency Medicine

## 2022-11-03 DIAGNOSIS — M79604 Pain in right leg: Secondary | ICD-10-CM | POA: Diagnosis not present

## 2022-11-03 DIAGNOSIS — E119 Type 2 diabetes mellitus without complications: Secondary | ICD-10-CM | POA: Diagnosis not present

## 2022-11-03 DIAGNOSIS — R079 Chest pain, unspecified: Secondary | ICD-10-CM | POA: Diagnosis present

## 2022-11-03 DIAGNOSIS — I110 Hypertensive heart disease with heart failure: Secondary | ICD-10-CM | POA: Diagnosis not present

## 2022-11-03 DIAGNOSIS — I2510 Atherosclerotic heart disease of native coronary artery without angina pectoris: Secondary | ICD-10-CM | POA: Diagnosis not present

## 2022-11-03 DIAGNOSIS — J449 Chronic obstructive pulmonary disease, unspecified: Secondary | ICD-10-CM | POA: Diagnosis not present

## 2022-11-03 DIAGNOSIS — Z951 Presence of aortocoronary bypass graft: Secondary | ICD-10-CM | POA: Diagnosis not present

## 2022-11-03 DIAGNOSIS — I509 Heart failure, unspecified: Secondary | ICD-10-CM | POA: Diagnosis not present

## 2022-11-03 DIAGNOSIS — R0789 Other chest pain: Secondary | ICD-10-CM | POA: Insufficient documentation

## 2022-11-03 LAB — BASIC METABOLIC PANEL
Anion gap: 10 (ref 5–15)
BUN: 14 mg/dL (ref 6–20)
CO2: 21 mmol/L — ABNORMAL LOW (ref 22–32)
Calcium: 9 mg/dL (ref 8.9–10.3)
Chloride: 104 mmol/L (ref 98–111)
Creatinine, Ser: 0.83 mg/dL (ref 0.44–1.00)
GFR, Estimated: >60 mL/min (ref >60)
Glucose, Bld: 191 mg/dL — ABNORMAL HIGH (ref 70–99)
Potassium: 3.3 mmol/L — ABNORMAL LOW (ref 3.5–5.1)
Sodium: 135 mmol/L (ref 135–145)

## 2022-11-03 LAB — CBC
HCT: 34.3 % — ABNORMAL LOW (ref 36.0–46.0)
Hemoglobin: 11.8 g/dL — ABNORMAL LOW (ref 12.0–15.0)
MCH: 29.8 pg (ref 26.0–34.0)
MCHC: 34.4 g/dL (ref 30.0–36.0)
MCV: 86.6 fL (ref 80.0–100.0)
Platelets: 262 10*3/uL (ref 150–400)
RBC: 3.96 MIL/uL (ref 3.87–5.11)
RDW: 12.6 % (ref 11.5–15.5)
WBC: 6.3 10*3/uL (ref 4.0–10.5)
nRBC: 0 % (ref 0.0–0.2)

## 2022-11-03 LAB — TROPONIN I (HIGH SENSITIVITY): Troponin I (High Sensitivity): 19 ng/L — ABNORMAL HIGH (ref <18)

## 2022-11-03 MED ORDER — OXYCODONE-ACETAMINOPHEN 5-325 MG PO TABS
2.0000 | ORAL_TABLET | Freq: Once | ORAL | Status: AC
  Administered 2022-11-03: 2 via ORAL
  Filled 2022-11-03: qty 2

## 2022-11-03 MED ORDER — ONDANSETRON 4 MG PO TBDP
4.0000 mg | ORAL_TABLET | Freq: Once | ORAL | Status: AC
  Administered 2022-11-03: 4 mg via ORAL
  Filled 2022-11-03: qty 1

## 2022-11-03 MED ORDER — OXYCODONE-ACETAMINOPHEN 5-325 MG PO TABS: 1.0000 | ORAL_TABLET | ORAL | 0 refills | Status: DC | PRN

## 2022-11-03 MED ORDER — ONDANSETRON 4 MG PO TBDP: 4.0000 mg | ORAL_TABLET | Freq: Three times a day (TID) | ORAL | 0 refills | Status: AC | PRN

## 2022-11-03 NOTE — ED Provider Notes (Signed)
[facility] Provider Note    Event Date/Time   First MD Initiated Contact with Patient 11/03/22 1334     (approximate)  History   Chief Complaint: Leg Pain and Chest Pain  HPI  Kayla Caldwell is a 47 y.o. female with a past medical history of CHF, COPD, diabetes, CAD, hypertension, hyperlipidemia, bypass in 2022, presents to the emergency department for right leg pain as well as chest pain.  According to the patient last night she developed pain in her right leg states pain is throughout the entire leg but mostly in the area where they took a vein for her CABG procedure.  Patient states she has been experiencing intermittent pain in the left chest as well.  No pleuritic pain.  No nausea no shortness of breath.  Physical Exam   Triage Vital Signs: ED Triage Vitals  Encounter Vitals Group     BP 11/03/22 1248 (!) 162/70     Systolic BP Percentile --      Diastolic BP Percentile --      Pulse Rate 11/03/22 1248 66     Resp 11/03/22 1248 18     Temp 11/03/22 1248 97.9 F (36.6 C)     Temp Source 11/03/22 1248 Oral     SpO2 11/03/22 1248 97 %     Weight 11/03/22 1248 230 lb (104.3 kg)     Height 11/03/22 1248 5\' 4"  (1.626 m)     Head Circumference --      Peak Flow --      Pain Score 11/03/22 1247 10     Pain Loc --      Pain Education --      Exclude from Growth Chart --     Most recent vital signs: Vitals:   11/03/22 1248  BP: (!) 162/70  Pulse: 66  Resp: 18  Temp: 97.9 F (36.6 C)  SpO2: 97%    General: Awake, no distress.  CV:  Good peripheral perfusion.  Regular rate and rhythm  Resp:  Normal effort.  Equal breath sounds bilaterally.  Abd:  No distention.  Soft, nontender.  No rebound or guarding. Other:  Mild tenderness to palpation throughout the right leg.  No specific area seems to be more tender.  Good range of motion.  No edema no erythema.   ED Results / Procedures / Treatments   EKG  EKG viewed and interpreted by  myself shows sinus bradycardia 58 bpm with a narrow QRS, normal axis, normal intervals, nonspecific ST changes.  No C elevation.  RADIOLOGY  X-ray reviewed and interpreted by myself shows no obvious consolidation. Radiology is read the x-ray as cardiomegaly with interstitial densities possibly reflecting mild edema. Ultrasound is negative for DVT   MEDICATIONS ORDERED IN ED: Medications - No data to display   IMPRESSION / MDM / ASSESSMENT AND PLAN / ED COURSE  I reviewed the triage vital signs and the nursing notes.  Patient's presentation is most consistent with acute presentation with potential threat to life or bodily function.  Patient presents to the emergency department for pains in her right leg as well as her chest since last night.  Denies any pleuritic pain.  No shortness of breath.  No nausea or diaphoresis.  Patient does state her main complaint is pain throughout her right leg which started last night.  We will dose pain medication and obtain labs including cardiac enzymes.  Will obtain a chest x-ray as well as a right  lower extremity ultrasound to rule out DVT patient agreeable to plan of care and workup.  Patient's workup overall reassuring chemistry shows no significant findings, CBC is reassuring with a normal white blood cell count.  Troponin of 19 which is unchanged from historical values.  Chest x-ray shows mild edema or interstitial changes.  Patient is taking Bumex.  Ultrasound is negative for DVT.  Given the patient's reassuring workup I believe the patient is safe for discharge home.  Patient denies any shortness of breath and is satting 97% on room air.  We will discharge with a short course of pain medication have the patient follow-up with her doctor regarding her leg pain.  FINAL CLINICAL IMPRESSION(S) / ED DIAGNOSES   Right leg pain Chest pain  Note:  This document was prepared using Dragon voice recognition software and may include unintentional dictation  errors.   Minna Antis, MD 11/03/22 [phone]

## 2022-11-03 NOTE — ED Triage Notes (Signed)
Pt reports right lower leg pain since last night. States it is where they went into her for her CABG in 2022. Also endorses CP that started 2 hours ago. Pt took muscle relaxer with no relief. Pt on plavix.

## 2022-11-07 NOTE — Group Note (Deleted)

## 2022-11-07 NOTE — Unmapped (Signed)
[id number]  [facility] PAIN MANAGEMENT [address]  [place] [address]  [phone]    Date: November 08, 2022  Patient Name: Deborah Bauer  MRN: [id number]  PCP: Karie Georges Pap  Referring Provider: Tomasa Rand, *    Assessment/Plan:        1. Nontraumatic incomplete tear of left rotator cuff    2. Mass of arm, right      Deborah Bauer is a 47 y.o. female with a PMHx significant for anemia, anxiety, arthritis, CAD, cardiovascular disease, CHF, COPD, CVA, diabetes mellitus w/o complication, HTN, migraines, myocardial infarction, neuropathy, and sleep apnea who is [facility] for nontraumatic tear of left rotator cuff.    Per chart review, the patient was referred to our clinic by Orthopedics for left shoulder pain    Nontraumatic tear of left rotator cuff, unspecified tear extent  Ongoing, not at goal. Patient endorses non-traumatic left-sided shoulder which negatively limits her ROM. Patient received left corticosteroid injection in May with though only maintains benefit for about month. Patient currently finds benefit with Tylenol and Robaxin along with short-course script of Percocet 5-325 mg provided by the ED. There is pain with left supraspinatus, infraspinatus, and teres minor test, but no sensory or motor deficits.  - Will place referral to PT  - Continue conservative management such as Tylenol, Robaxin, and Voltaren 1% gel  - Start Cymbalta 30 mg every day  -Not a candidate for NSAID use given diabetes mellitus and history of CAD    2. Mass of arm, right  Present for years, subjectively enlarging and becoming more painful which would argue against the prior diagnosis of a lipoma.  We will plan to obtain imaging to evaluate what this could be and to monitor any progression over time compared to prior imaging on file.  Physical exam does not show any overt signs of inflammation or signs of infection.  - MRI Upper Extremity Non-Joint Right Wo Contrast; Future    Return in 3 months (on 02/07/2023) for In-person, Recheck.    Requested Prescriptions     Signed Prescriptions Disp Refills    DULoxetine (CYMBALTA) 30 MG capsule 30 capsule 2     Sig: Take 1 capsule (30 mg total) by mouth daily.     Orders Placed This Encounter   Procedures    MRI Upper Extremity Non-Joint Right Wo Contrast     Standing Status:   Future     Standing Expiration Date:   11/08/2023     Order Specific Question:   Which body part?     Answer:   Humerus     Order Specific Question:   Is the patient pregnant?     Answer:   No     Order Specific Question:   What is the patient's sedation requirement?     Answer:   No Sedation     Order Specific Question:   Does the patient have metallic implants or external cardiac devices?     Answer:   No Implants     Order Specific Question:   Reason for Exam:     Answer:   Painful mass to posterior R humerus noted on CT humerus from 2022, enlarging and becoming more painful, radiologist recommended MRI at that time to better characterize the mass.     Order Specific Question:   Performed at     Answer:   [facility]  Ambulatory referral to Physical Therapy     Standing Status:   Future     Standing Expiration Date:   11/08/2023     Referral Priority:   Routine     Referral Type:   Physical Therapy     Number of Visits Requested:   1          Subjective:      HPI:  Deborah Bauer is seen in consultation at the request of Kamath, Ganesh Vasant, * for evaluation and recommendations regarding her chronic pain.      Today, the patient presents to our clinic for evaluation of left shoulder pain and right arm pain.  Patient reports about a 3-year history of pain to her left shoulder.  Started gradually over time, there was no associated injury. She reports limited ROM in her shoulder such as lifting her arms along with difficulty performing ADLs such as reaching behind her or scratching her back, as this makes the pain worse.  She follows with orthopedic surgery and has had an MRI done previously which demonstrated incomplete rotator cuff tendon pairs of her supraspinatus tendon, however she was told that she was not a good surgical candidate due to her cardiovascular risk factors.  She has not previously had surgery to her right shoulder.  She denies any upper extremity numbness or weakness.    She notes she is not interested in surgery given her cardiac history. She is interested in less invasive therapies so that she can try to avoid any surgery. She has not participated in PT for her shoulder though is interested in starting PT.    She confirms receiving a left shoulder intra-articular corticosteroid injection, though only provided relief for about a month.    Secondarily, she also complains of a painful mass to her posterior right arm over the past 3 years.  This is previously diagnosed as a lipoma.  It was seen on prior CT imaging.  However, she thinks that is getting larger and more painful over the past few months.  Has not been recently worked up or monitored.    In terms of medications, the patient reports taking Tylenol, Robaxin, and Percocet 5-325 mg (per ER). She confirms minimal benefit with Tylenol and benefit with Robaxin especially with sleep given it helps her relax. She confirms using Voltaren gel with benefit.     Current Medication Regimen:   Acetaminophen 650 mg every 4 hours  Methocarbamol 500 mg every 8 hours  Percocet 5-325  Voltaren gel    The pain began 2019 and is located in her bilateral shoulder, low back, right knee, and right ankle.  The pain is described as aching, burning, shooting, and stabbing.  At worst it rates 8/10 and at best 5/10.  On average over the last month, the pain has been a 8/10.  It is a constant pain.    Her pain symptoms are associated with numbness and stiffness.  The patient has not experienced lost of bowel or bladder control.  Her pain is made worse with sitting and lying.  She does get some relief from medication and injections.     Patient has not been seen in a previous pain clinic.    Previous treatments include: PT, Water therapy, and Cortisone injections    Medications tried include:  NSAIDS- Voltaren Gel  Antidepressants- none tried  Neuroleptics-  none tried  Muscle relaxants- cyclobenzaprine (Flexeril), methocarbamol (Robaxin)  Topicals- Lidoderm patches and Voltaren gel  Short-acting opiates- hydrocodone  and oxycodone  Long-acting opiates- None  Anxiolytics- none  Previous interventional procedures include:  Corticoid steroid injections     Previous imaging/diagnostic studies include:   MRI Left Shoulder 04/26/22  FINDINGS:   Note is made that the prior left shoulder MRI 06/12/2021 indication   was also a left shoulder soft tissue mass, and no soft tissue mass   was identified on the MRI.     Rotator cuff: The previously seen midsubstance tear at the critical   zone of a posterior supraspinatus tendon has worsened, and now   extends through the articular tendon surface, I moderate to   high-grade partial-thickness tear measuring up to 9 mm in AP   dimension. No significant tendon retraction (sagittal series 9,   image 13 and coronal series 7, image 12). There is similar fluid   bright signal at the deep aspect of the infraspinatus   musculotendinous junction (sagittal series 9, image 9, a   partial-thickness interstitial tear. Unchanged mild fluid bright   signal this anterior to the infraspinatus musculotendinous junction   in a similar region.     There is a new horizontal linear partial-thickness midsubstance tear   of the anterior supraspinatus tendon footprint measuring up to 7 mm   in transverse dimension (coronal series 7, image 14) and 5 mm in AP   dimension (sagittal series 9, image 14). Mild superior subscapularis   intermediate T2 signal tendinosis is new from prior. The teres minor   is intact.     Muscles: No rotator cuff muscle atrophy, fatty infiltration, or   edema. Biceps long head: The intra-articular long head of the biceps tendon   is intact.     Acromioclavicular Joint: There are mild-to-moderate degenerative   changes of the acromioclavicular joint including joint space   narrowing, subchondral marrow edema, and peripheral osteophytosis.   There is again a persistent os acromiale with fluid across the   synchondrosis and minimal subcortical cystic change. Type III   acromion, with minimal downsloping of the anterolateral acromion.   Trace fluid within the subacromial/subdeltoid bursa, similar to   prior.     Glenohumeral Joint: Mild-to-moderate thinning of the glenohumeral   joint.     Labrum: Grossly intact, but evaluation is limited by lack of   intraarticular fluid.     Bones: There is again a reactive tiny cyst within the anterior   greater tuberosity with mild surrounding marrow edema, similar to   prior.     Other: There are markers at the superior aspect of the shoulder,   superior to the acromioclavicular joint, and at the proximal lateral   shoulder at the level of the top of the humeral head. In between   these markers there is again prominent subcutaneous fat. There is an   unchanged prominent lobulation within the subcutaneous fat in the   area of interest of the superolateral shoulder, measuring   approximately 3 x 4 x 2.5 cm (transverse by AP by craniocaudal),   unchanged from 06/12/2021. This does not have a complete peripheral   rim to suggest a lipoma and likely represents a prominent fat   lobule. No suspicious enhancement or nodularity     IMPRESSIONS:   Compared to 06/12/2021:     1. In the area of interest of the superolateral left shoulder, there   is prominent subcutaneous fat. The patient's palpable abnormality   appears to correspond to a prominent benign fat lobule within the   subcutaneous fat.  This does not demonstrate a complete outer border   to indicate a discrete lipoma. It is not significantly changed   compared to 06/12/2021 MRI.   2. Interval worsening of the previously seen midsubstance tear at   the critical zone of the posterior supraspinatus tendon, now a   moderate to high-grade partial-thickness tear nearly extending   through the articular tendon surface and measuring up to 9 mm in AP   dimension. No significant tendon retraction.   3. New horizontal linear partial-thickness midsubstance tear of the   anterior supraspinatus tendon footprint measuring up to 7 mm in   transverse dimension and 5 mm in AP dimension.   4. Unchanged partial-thickness interstitial tear at the deep aspect   of the infraspinatus musculotendinous junction.   5. New mild superior subscapularis tendinosis.   6. Mild-to-moderate degenerative changes of the acromioclavicular   joint. Unchanged os acromiale.     The patient's visit does not involve Workman's Compensation nor is she involved in litigation regarding this pain.    Deborah Bauer goals include: complete resolution of the pain with medications if necessary      Past Medical History:   Diagnosis Date    Anemia 06/09/2009    Anxiety     Arthritis     CAD (coronary artery disease), native coronary artery 05/24/2010    Cardiovascular disease     CHF (congestive heart failure) (CMS-HCC)     COPD (chronic obstructive pulmonary disease) (CMS-HCC)     COPD (chronic obstructive pulmonary disease) (CMS-HCC) 04/11/2021    CVA (cerebral vascular accident) (CMS-HCC) 08/19/2020    Dental caries     Diabetes mellitus without complication (CMS-HCC) 08/11/2014    Hypertension     Migraine 03/17/2012    Myocardial infarction (CMS-HCC) 08/03/2020    Heart attack, 3rd heart attack had two prior to this event    Neuropathy 08/15/2011    Sleep apnea 04/10/2020    Type 2 diabetes mellitus (CMS-HCC)     a1c- 7.01 August 2021,      a1c of  13 in 2022     Past Surgical History:   Procedure Laterality Date    CORONARY ARTERY BYPASS GRAFT  08/18/2020     Family History   Problem Relation Age of Onset    Heart disease Mother     Heart disease Father Social History:  She reports that she quit smoking about 14 years ago. Her smoking use included cigarettes. She has never used smokeless tobacco. She reports that she does not drink alcohol and does not use drugs.    Allergies as of 11/08/2022 - Reviewed 11/08/2022   Allergen Reaction Noted    Pantoprazole Other (See Comments) and Swelling 12/19/2019    Tramadol Swelling 09/26/2021    Jardiance [empagliflozin]  07/05/2022    Compazine [prochlorperazine]  09/26/2021    Hydralazine Other (See Comments) 11/03/2016    Lisinopril  09/26/2021    Nitroglycerin Other (See Comments) 08/08/2012    Reglan [metoclopramide hcl]  09/26/2021    Toradol [ketorolac]  09/26/2021    Adhesive tape-silicones Other (See Comments) 12/19/2019      Current Outpatient Medications   Medication Sig Dispense Refill    acetaminophen (TYLENOL) 325 MG tablet Take 2 tablets (650 mg total) by mouth every four (4) hours as needed. PRN      albuterol HFA 90 mcg/actuation inhaler Inhale 2 puffs. PRN      albuterol sulfate 90 mcg/actuation AePB Inhale 2 puffs every four (4)  hours as needed.      amlodipine (NORVASC) 10 MG tablet Take 1 tablet (10 mg total) by mouth daily. 90 tablet 3    aspirin (ECOTRIN) 81 MG tablet Take 1 tablet (81 mg total) by mouth daily.      atorvastatin (LIPITOR) 80 MG tablet TAKE 1 TABLET(80 MG) BY MOUTH DAILY 90 tablet 0    bumetanide (BUMEX) 2 MG tablet Take 1 tablet (2 mg total) by mouth every other day. 45 tablet 3    carvedilol (COREG) 25 MG tablet TAKE 1 TABLET(25 MG) BY MOUTH TWICE DAILY 180 tablet 0    clopidogrel (PLAVIX) 75 mg tablet Take 1 tablet (75 mg total) by mouth daily. 90 tablet 0    empty container (SHARPS-A-GATOR DISPOSAL SYSTEM) Misc Use as directed 1 each 2    ergocalciferol-1,250 mcg, 50,000 unit, (DRISDOL) 1,250 mcg (50,000 unit) capsule Take 1 capsule (1,250 mcg total) by mouth once a week for 12 doses. 12 capsule 0    ezetimibe (ZETIA) 10 mg tablet TAKE 1 TABLET(10 MG) BY MOUTH DAILY 90 tablet 0 flash glucose sensor kit 1 each by Miscellaneous route.      fluticasone propionate (FLONASE) 50 mcg/actuation nasal spray 2 sprays into each nostril. PRN      insulin glargine (LANTUS) 100 unit/mL injection Inject 0.02 mL (2 Units total) under the skin nightly.      insulin lispro (HUMALOG) 100 unit/mL injection Inject 0.04 mL (4 Units total) under the skin Three (3) times a day before meals. 4 units before meals      ondansetron (ZOFRAN) 8 MG tablet PRN      ranolazine (RANEXA) 500 MG 12 hr tablet Take 1 tablet (500 mg total) by mouth two (2) times a day. 180 tablet 3    semaglutide 3 mg Tab Take 3 mg by mouth. rybelsus      DULoxetine (CYMBALTA) 30 MG capsule Take 1 capsule (30 mg total) by mouth daily. 30 capsule 2    evolocumab (REPATHA SURECLICK) 140 mg/mL PnIj Inject 140 mg under the skin every fourteen (14) days. (Patient not taking: Reported on 11/08/2022) 10 mL 0    hydrOXYzine (ATARAX) 25 MG tablet Take 0.5 tablets (12.5 mg total) by mouth. (Patient not taking: Reported on 11/08/2022)      spironolactone (ALDACTONE) 25 MG tablet Take 1 tablet (25 mg total) by mouth daily. (Patient not taking: Reported on 11/08/2022) 90 tablet 3    suvorexant (BELSOMRA) 5 mg tablet Take 1 tablet (5 mg total) by mouth daily as needed. .5 (Patient not taking: Reported on 11/08/2022)      vilazodone (VIIBRYD) 20 mg tablet Take 1 tablet (20 mg total) by mouth daily. (Patient not taking: Reported on 11/08/2022)       No current facility-administered medications for this visit.     Review of Systems:  GENERAL: difficulty sleeping  HEENT: none  SKIN:none  PULMONARY:none  ENDOCRINE: hot flashes  GASTROINTESTINAL:none  GENITOURINARY:none  MUSCULOSKELETAL:joint aches/, joint swelling, back pain, muscle aches, and muscle weakness  CARDIOVASCULAR:chest pain  NEUROLOGIC:none  PSYCHIATRIC: anxiety  HEMATOLOGIC: none  IMMUNOLOGIC: allergies       Objective:      PHYSICAL EXAM:    VITALS:   Vitals:    11/08/22 0922   BP: 147/89   Pulse: 51 Resp: 16   Temp: 36.4 ??C (97.5 ??F)   SpO2: 99%     GENERAL:  The patient is well developed, well-nourished and appears to be in no apparent distress.  The patient is pleasant and interactive. Patient is a good historian.  HEENT:    Reveals normocephalic/atraumatic. Clear sclera. Mucous membranes are moist.  CARDIOVASCULAR:   2+ radial pulses bilaterally  RESPIRATORY:   Normal work of breathing.  No supplemental oxygen.    NEUROLOGIC:    The patient was alert and oriented, speech fluent, normal language.  No clonus present. 5/5 strength to bilateral upper extremities. Strong hand grip bilaterally  MUSCULOSKELETAL:    Good range of motion of extremities. The patient was able to ambulate without difficulty throughout the clinic today without the assistance of a walking aid.  Pain with left supraspinatus, infraspinatus, and teres minor test. Normal range of motion of the neck. Right arm: mildly tender fleshy mass on posterior right arm  SKIN:   No obvious rashes, lesions, or erythema.  PSYCHIATRIC:  Appropriate mood and affect.  No pressured speech.    Documentation assistance was provided by Darcel Smalling, on November 08, 2022 at 9:28 AM for Dr. Cloyde Reams, DO.    November 08, 2022 11:20 AM. Documentation assistance provided by the Scribe. I was present during the time the encounter was recorded. The information recorded by the Scribe was done at my direction and has been reviewed and validated by me.    Cloyde Reams, DO  Pain Medicine Fellow  Department of Anesthesiology  [facility]

## 2022-11-08 ENCOUNTER — Ambulatory Visit: Admit: 2022-11-08 | Discharge: 2022-11-09 | Payer: MEDICARE | Attending: Anesthesiology | Primary: Anesthesiology

## 2022-11-08 DIAGNOSIS — M75112 Incomplete rotator cuff tear or rupture of left shoulder, not specified as traumatic: Principal | ICD-10-CM

## 2022-11-08 DIAGNOSIS — R2231 Localized swelling, mass and lump, right upper limb: Principal | ICD-10-CM

## 2022-11-08 MED ORDER — DULOXETINE 30 MG CAPSULE,DELAYED RELEASE
ORAL_CAPSULE | Freq: Every day | ORAL | 2 refills | 30 days | Status: CP
Start: 2022-11-08 — End: 2023-02-06

## 2022-11-08 NOTE — Unmapped (Signed)
We ordered medication (duloxetine) to help with your pain. Continue taking the acetaminophen and Voltaren gel.    You can keep going to Sports Medicine if you feel that you need additional steroid injections (discuss that with the sports medicine physician).    We will order an MRI to better determine what is causing the pain to your right arm.    We also referred you to physical therapy to help with pain and strength in your left shoulder.

## 2022-11-09 NOTE — Unmapped (Signed)
I saw and evaluated the patient, participating in the key portions of the service.  I reviewed the resident’s note.  I agree with the resident’s findings and plan.   Gunner Iodice A. Hawkin Charo, MD

## 2022-11-15 ENCOUNTER — Encounter: Payer: Self-pay | Admitting: Emergency Medicine

## 2022-11-15 ENCOUNTER — Ambulatory Visit
Admission: EM | Admit: 2022-11-15 | Discharge: 2022-11-15 | Disposition: A | Discharge status: Home / Self Care | Payer: 59 | Attending: Emergency Medicine | Admitting: Emergency Medicine

## 2022-11-15 ENCOUNTER — Other Ambulatory Visit: Payer: Self-pay

## 2022-11-15 DIAGNOSIS — Z5321 Procedure and treatment not carried out due to patient leaving prior to being seen by health care provider: Secondary | ICD-10-CM

## 2022-11-15 NOTE — ED Triage Notes (Signed)
Patient presents to [facility] for evaluation after she was diagnosed with a torn rotator cuff, and patient started cymbalta three days ago.  Hives and itching started two days ago.  Patient has taken Benadryl without much relief.  Patient states she has excoriation marks all over from the itching.

## 2022-11-15 NOTE — ED Notes (Signed)
Patient came out to desk, states she is leaving, she has been "waiting to long"

## 2022-11-17 ENCOUNTER — Ambulatory Visit
Admit: 2022-11-17 | Discharge: 2022-11-18 | Payer: MEDICARE | Attending: Student in an Organized Health Care Education/Training Program | Primary: Student in an Organized Health Care Education/Training Program

## 2022-11-17 DIAGNOSIS — M75102 Unspecified rotator cuff tear or rupture of left shoulder, not specified as traumatic: Principal | ICD-10-CM

## 2022-11-17 DIAGNOSIS — E119 Type 2 diabetes mellitus without complications: Principal | ICD-10-CM

## 2022-11-17 DIAGNOSIS — Z1211 Encounter for screening for malignant neoplasm of colon: Principal | ICD-10-CM

## 2022-11-17 DIAGNOSIS — R110 Nausea: Principal | ICD-10-CM

## 2022-11-17 DIAGNOSIS — A048 Other specified bacterial intestinal infections: Principal | ICD-10-CM

## 2022-11-17 MED ORDER — SUVOREXANT 5 MG TABLET
Freq: Every day | ORAL | 0 refills | 0 days | Status: CN | PRN
Start: 2022-11-17 — End: ?

## 2022-11-17 MED ORDER — INSULIN LISPRO (U-100) 100 UNIT/ML SUBCUTANEOUS SOLUTION
Freq: Three times a day (TID) | SUBCUTANEOUS | 0 refills | 84 days | Status: CP
Start: 2022-11-17 — End: ?

## 2022-11-17 MED ORDER — INSULIN GLARGINE (U-100) 100 UNIT/ML SUBCUTANEOUS SOLUTION
Freq: Every evening | SUBCUTANEOUS | 0 refills | 0 days | Status: CP
Start: 2022-11-17 — End: ?

## 2022-11-17 NOTE — Unmapped (Addendum)
To schedule the MRI of your RIGHT ARM, please call [phone]     To schedule your Endoscopy and colonoscopy, please call [phone]

## 2022-11-17 NOTE — Unmapped (Signed)
Assessment and Plan:     Diagnoses and all orders for this visit:    Nontraumatic tear of left rotator cuff, unspecified tear extent    Diabetes mellitus without complication (CMS-HCC)    Screening for malignant neoplasm of colon  -     Colonoscopy; Future    Nausea  -     EGD; Future    H. pylori infection  -     EGD; Future    Coronary artery disease involving native coronary artery, unspecified whether angina present, unspecified whether native or transplanted heart    Congestive heart failure, unspecified HF chronicity, unspecified heart failure type (CMS-HCC)    Hypertension, unspecified type    Other orders  -     INFLUENZA VACCINE IIV3(IM)(PF)6 MOS UP  -     insulin lispro (HUMALOG) 100 unit/mL injection; Inject 0.04 mL (4 Units total) under the skin Three (3) times a day before meals. 4 units before meals  -     insulin glargine (LANTUS) 100 unit/mL injection; Inject 0.02 mL (2 Units total) under the skin nightly.    ---------------  47 yo female presents today for follow up  -she was last seen by myself on 09/07/2022    #Type 2 Diabetes  -Chronic. Diagnosed in 2014, started on insulin around 2018.   -Currently taking Lantus 2.5U at bedtime as well as Humalog with meals   -We STOPPED Rybelsus given A1C was 7.0% but also she developed GI ADE from medications   -Previously tried but did not tolerate: Metformin (GI side effects), Jardiance (Yeast infections), Trulicity (Nausea/vomiting)   -Per patient, next diabetic retinal exam will be 04/2023.   -Urine Microalbumin due 01/2023  -Will need to perform foot exam  It is too soon to recheck A1C. 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   -BB: Carvedilol 25mg  BID  -SGLT2i: not taking as developed ADE with medication (Yeast infections)   -Spirolactone? Yes, Spironactone 25mg  every day   -Diuretics: Bumex 2mg  every other day  -Additional Anti-hypertensive: Amlodipine 10mg  qd  -Lipid lowering agent: Atorvastatin 80mg  every day, Zeita 10mg  every day, Evolocumab 140mg  SubQ every 14 days  -Anti-platelet therapy: Aspirin 81mg  every day, Clopidogrel 75mg  every day   -For chronic angina: Currently taking Ranexa 500mg  BID  -Discussed her BP is quite elevated today and advised initiation/changing of medications. She declined at this time as she is having pain and believes her elevated BP is from this.   -Discussed in depth BP goal of <130/80    #Bipolar disorder, Insomnia   -Currently prescibed Suvorexant 5mg  daily PRN, Viibryd 20mg  every day     #Left Rotator Cuff Tear   -Evaluated by Gaylord Shih [facility]) on 06/23/2022 who recommended surgery. She would like to avoid surgery if possible. She was re-evaluated by [facility] on 07/14/2022 where it was recommended to receive corticosteroid injection and to undergo PT and pain management.  -She is following with Pain Management with last visit on 11/08/2022. PT referral was placed as well  as she was started on Cymbalta. However, she develop pruritus with Cymbalta and stopped this.   -She should avoid NSAIDs given her cardiac history. To Continue Tylenol, muscle relaxant and topical anti-inflammatories    Nausea, H. Pylori, CRC Screening   -  Her nausea has slightly improved since stopping GLP-1 agonist  -On chart review, she appeared to have had EGD in 2009 that revealed H. Pylori present  -We discussed again importance of obtaining EGD/Colonoscopy or referring to GI. She deferred GI referral but is agreeable to obtaining EGD and Colonoscopy. Orders placed  -She is taking: Famotidine 20mg  BID, PRN Zofran 8mg      HEALTH MAINTENANCE  -Former smoker: started in 2000-2010, smoked <1 PPD   -Received Flu vaccine without difficulty. VIS provided   -Mammogram due 05/04/2023  -Hysterectomy in 2018, pap smear not indicated  -Hx of CVA, Convulsions: during hospitalization in 10/2012  -MRI of right arm ordered by Pain Management to further investigate possible Lipoma     Advised follow up in 4-6 weeks for chronic care/labs to be obtained--Lipid panel, CMP, A1C, Vitamin D, CBC    Subjective:     Deborah Bauer y.o.female  is here for follow up     She was started on Cymbalta for her pain but she developed hives   She does not want to undergo surgery at this time     She also notes having a more painful lipoma on her right arm     DIABETES  -She did stop the Rybelsus and is checking her BG at home which this morning was 79  -The highest her BG has been was 202 because she had a soda     CAD/CHF, HTN  -She is not taking Spirolactone due to emesis    -stopped this 1-2 months ago   -She does check her BP at home and reports that it is around 145/80s     NAUSEA  -she stopped viibryd as caused nausea   -She has noticed that her nausea has improved since stopping the rybelsus     VITAMIN D  -she is taking her vitamin D    No LMP recorded. Patient has had a hysterectomy.      ROS:     ROS negative unless otherwise noted in HPI    Vital Signs:     Wt Readings from Last 3 Encounters:   11/19/22 (!) 110 kg (242 lb 8.1 oz)   11/17/22 (!) 110.4 kg (243 lb 4.8 oz)   11/08/22 (!) 110.7 kg (244 lb)     Temp Readings from Last 3 Encounters:   11/20/22 36.7 ??C (98.1 ??F) (Oral)   11/17/22 36.7 ??C (98 ??F) (Temporal)   11/08/22 36.4 ??C (97.5 ??F) (Skin)     BP Readings from Last 3 Encounters:   11/20/22 175/95   11/17/22 154/90   11/08/22 147/89     Pulse Readings from Last 3 Encounters:   11/20/22 58   11/17/22 75   11/08/22 51       Objective:     General Appearance: Alert, cooperative, in no acute distress  Head and Neck: Normocephalic, atraumatic.   EENT: EOMI, wearing glasses, conjunctiva clear.   Cardiovascular:  Regular rate and rhythm. No murmurs  Respiratory: Lungs clear to auscultation bilaterally, Respiratory effort unremarkable. No wheezes or rhonchi.   Musculoskeletal: decreased AROM of left shoulder noted. Grip strength is intact. Gait is stable  Extremities :No edema. Radial pulses intact  Integumentary: Warm and dry. Possible lipoma on right arm?no rashes on chest, extremities noted  Neurologic: Alert and oriented x3. CN II-XII grossly intact.   Psychiatric: Mood and affect appropriate for situation.

## 2022-11-20 ENCOUNTER — Ambulatory Visit: Admit: 2022-11-20 | Discharge: 2022-11-20 | Disposition: A | Discharge status: Home / Self Care | Payer: MEDICARE

## 2022-11-20 MED ORDER — HYDROXYZINE HCL 25 MG TABLET
ORAL_TABLET | Freq: Four times a day (QID) | ORAL | 0 refills | 3.00000 days | Status: CP
Start: 2022-11-20 — End: 2022-11-23

## 2022-11-20 MED ORDER — FAMOTIDINE 20 MG TABLET
ORAL_TABLET | Freq: Two times a day (BID) | ORAL | 0 refills | 30 days | Status: CP
Start: 2022-11-20 — End: 2022-12-20

## 2022-11-20 MED ORDER — GABAPENTIN 300 MG CAPSULE
ORAL_CAPSULE | Freq: Three times a day (TID) | ORAL | 0 refills | 30.00000 days | Status: CP
Start: 2022-11-20 — End: 2022-12-20

## 2022-11-20 MED ADMIN — famotidine (PEPCID) tablet 20 mg: 20 mg | ORAL | @ 05:00:00 | Stop: 2022-11-20

## 2022-11-20 MED ADMIN — gabapentin (NEURONTIN) capsule 300 mg: 300 mg | ORAL | @ 05:00:00 | Stop: 2022-11-20

## 2022-11-20 MED ADMIN — hydrOXYzine (ATARAX) tablet 50 mg: 50 mg | ORAL | @ 05:00:00 | Stop: 2022-11-20

## 2022-11-20 NOTE — Unmapped (Signed)
Patient presents with c/o chronic left shoulder pain and itching.  Patient states she is seen at [facility] pain clinic for her chronic pain and was prescribed Cymbalta but has had itching since starting.  Patient reports last and first ever dose of Cymbalta was three days ago.

## 2022-11-20 NOTE — Unmapped (Signed)
Emergency Department Provider Note        ED Clinical Impression     Final diagnoses:   Generalized pruritus (Primary)   Chronic left shoulder pain       ED Assessment/Plan   Deborah Bauer 47 y.o. patient who  has a past medical history of Anemia (06/09/2009), Anxiety, Arthritis, CAD (coronary artery disease), native coronary artery (05/24/2010), Cardiovascular disease, CHF (congestive heart failure) (CMS-HCC), COPD (chronic obstructive pulmonary disease) (CMS-HCC), COPD (chronic obstructive pulmonary disease) (CMS-HCC) (04/11/2021), CVA (cerebral vascular accident) (CMS-HCC) (08/19/2020), Dental caries, Diabetes mellitus without complication (CMS-HCC) (08/11/2014), Hypertension, Migraine (03/17/2012), Myocardial infarction (CMS-HCC) (08/03/2020), Neuropathy (08/15/2011), Sleep apnea (04/10/2020), and Type 2 diabetes mellitus (CMS-HCC). she presents with diffuse itching, chronic shoulder pain.  Patient recently seen for her chronic shoulder pain and placed on Cymbalta.  She took 1 dose and had diffuse hives which have since resolved.  She has had persistent itching since that time.  She has been taking Benadryl without improvement.  She takes Tylenol, Robaxin and uses Voltaren gel.  She reports she has been unable to sleep for the last 2 nights due to itching and pain with laying on her right side where she has a lipoma and her left side where she has her rotator cuff tear.    Patient itching on exam.  Excoriations noted, but no rash or hives.  Pain with range of motion of both shoulders.    Plan for treatment of itching with hydroxyzine, Pepcid would not prescribe steroids given her history of diabetes.  Patient will be encouraged to take nonsedating allergy medicine during the day and hydroxyzine at night.  Will also prescribe gabapentin to see if this will help with her ongoing chronic pain.  Return precautions discussed        Discussion of Management with other Physicians, QHP or Appropriate Source:  N/A  Independent Interpretation of Studies: EKG  N/A; RAD  N/A, POCUS  N/A  External Records Reviewed: Patient's most recent outpatient clinic note  Escalation of Care, Consideration of Admission/Observation/Transfer:  N/A  Social determinants that significantly affected care: None applicable  Prescription drug(s) considered but not prescribed:   Diagnostic tests considered but not performed:   History obtained from other sources: None    Medical Decision Making             History     Chief Complaint   Patient presents with    Shoulder Pain     HPI  47 year old female presents to the emergency department with diffuse itching, chronic shoulder pain.  Patient recently seen for her chronic shoulder pain and placed on Cymbalta.  She took 1 dose and had diffuse hives which have since resolved.  She has had persistent itching since that time.  She has been taking Benadryl without improvement.  She takes Tylenol, Robaxin and uses Voltaren gel.  She reports she has been unable to sleep for the last 2 nights due to itching and pain with laying on her right side where she has a lipoma and her left side where she has her rotator cuff tear.  Past Medical History:   Diagnosis Date    Anemia 06/09/2009    Anxiety     Arthritis     CAD (coronary artery disease), native coronary artery 05/24/2010    Cardiovascular disease     CHF (congestive heart failure) (CMS-HCC)     COPD (chronic obstructive pulmonary disease) (CMS-HCC)     COPD (chronic obstructive pulmonary disease) (CMS-HCC) 04/11/2021  CVA (cerebral vascular accident) (CMS-HCC) 08/19/2020    Dental caries     Diabetes mellitus without complication (CMS-HCC) 08/11/2014    Hypertension     Migraine 03/17/2012    Myocardial infarction (CMS-HCC) 08/03/2020    Heart attack, 3rd heart attack had two prior to this event    Neuropathy 08/15/2011    Sleep apnea 04/10/2020    Type 2 diabetes mellitus (CMS-HCC)     a1c- 7.01 August 2021,      a1c of  13 in 2022       Past Surgical History:   Procedure Laterality Date    CORONARY ARTERY BYPASS GRAFT  08/18/2020       Family History   Problem Relation Age of Onset    Heart disease Mother     Heart disease Father        Social History     Socioeconomic History    Marital status: Single   Occupational History    Occupation: Arts administrator: [employer]   Tobacco Use    Smoking status: Former     Current packs/day: 0.00     Types: Cigarettes     Quit date: 2010     Years since quitting: 14.7    Smokeless tobacco: Never   Substance and Sexual Activity    Alcohol use: Never    Drug use: Never     Social Determinants of Health     Food Insecurity: No Food Insecurity (09/07/2020)    Received from [facility], [facility]    Hunger Vital Sign     Worried About Running Out of Food in the Last Year: Never true     Ran Out of Food in the Last Year: Never true   Transportation Needs: No Transportation Needs (09/07/2020)    Received from [facility], [facility]    PRAPARE - Transportation     Lack of Transportation (Medical): No     Lack of Transportation (Non-Medical): No       No current facility-administered medications for this encounter.     Current Outpatient Medications   Medication Sig Dispense Refill    acetaminophen (TYLENOL) 325 MG tablet Take 2 tablets (650 mg total) by mouth every four (4) hours as needed. PRN      albuterol HFA 90 mcg/actuation inhaler Inhale 2 puffs. PRN      albuterol sulfate 90 mcg/actuation AePB Inhale 2 puffs every four (4) hours as needed.      amlodipine (NORVASC) 10 MG tablet Take 1 tablet (10 mg total) by mouth daily. 90 tablet 3    aspirin (ECOTRIN) 81 MG tablet Take 1 tablet (81 mg total) by mouth daily.      atorvastatin (LIPITOR) 80 MG tablet TAKE 1 TABLET(80 MG) BY MOUTH DAILY 90 tablet 0    bumetanide (BUMEX) 2 MG tablet Take 1 tablet (2 mg total) by mouth every other day. 45 tablet 3    carvedilol (COREG) 25 MG tablet TAKE 1 TABLET(25 MG) BY MOUTH TWICE DAILY 180 tablet 0    clopidogrel (PLAVIX) 75 mg tablet Take 1 tablet (75 mg total) by mouth daily. 90 tablet 0    empty container (SHARPS-A-GATOR DISPOSAL SYSTEM) Misc Use as directed 1 each 2    ergocalciferol-1,250 mcg, 50,000 unit, (DRISDOL) 1,250 mcg (50,000 unit) capsule Take 1 capsule (1,250 mcg total) by mouth once a week for 12 doses. 12 capsule 0    evolocumab (REPATHA SURECLICK) 140 mg/mL  PnIj Inject 140 mg under the skin every fourteen (14) days. 10 mL 0    ezetimibe (ZETIA) 10 mg tablet TAKE 1 TABLET(10 MG) BY MOUTH DAILY 90 tablet 0    flash glucose sensor kit 1 each by Miscellaneous route.      fluticasone propionate (FLONASE) 50 mcg/actuation nasal spray 2 sprays into each nostril. PRN      hydrOXYzine (ATARAX) 25 MG tablet Take 0.5 tablets (12.5 mg total) by mouth. (Patient not taking: Reported on 11/17/2022)      insulin glargine (LANTUS) 100 unit/mL injection Inject 0.02 mL (2 Units total) under the skin nightly. 10 mL 0    insulin lispro (HUMALOG) 100 unit/mL injection Inject 0.04 mL (4 Units total) under the skin Three (3) times a day before meals. 4 units before meals 10 mL 0    ondansetron (ZOFRAN) 8 MG tablet PRN      ranolazine (RANEXA) 500 MG 12 hr tablet Take 1 tablet (500 mg total) by mouth two (2) times a day. 180 tablet 3    suvorexant (BELSOMRA) 5 mg tablet Take 1 tablet (5 mg total) by mouth daily as needed. .5         Review of Systems  10+ point review of systems negative except as detailed in HPI.      Physical Exam     BP 136/101  - Pulse 59  - Temp 37 ??C (98.6 ??F) (Oral)  - Resp 19  - Wt (!) 110 kg (242 lb 8.1 oz)  - SpO2 99%  - BMI 42.28 kg/m??     Physical Exam  Vitals and nursing note reviewed.   Constitutional:       Appearance: Normal appearance. She is obese.   HENT:      Head: Normocephalic and atraumatic.      Nose: No congestion.   Eyes:      Conjunctiva/sclera: Conjunctivae normal.   Cardiovascular:      Rate and Rhythm: Normal rate.      Pulses: Normal pulses.   Pulmonary:      Effort: Pulmonary effort is normal. No respiratory distress. Musculoskeletal:         General: Tenderness present.      Comments: Tenderness to palpation of right and left shoulders.  Pain with range of motion   Skin:     General: Skin is warm and dry.      Capillary Refill: Capillary refill takes less than 2 seconds.      Comments: Scattered excoriations from scratching.  No rash noted   Neurological:      General: No focal deficit present.      Mental Status: She is alert. Mental status is at baseline.   Psychiatric:         Mood and Affect: Mood normal.         Behavior: Behavior normal.         Thought Content: Thought content normal.         Judgment: Judgment normal.                          Desma Mcgregor, MD  11/20/22 [phone]

## 2022-11-22 NOTE — Unmapped (Signed)
The patient is requesting for the Nurse to contact them in regards to  wrong prescription being sent to the pharmacy. Patient says she using the quick pens for insulin and the ones sent to the pharmacy were incorrect.  Please contact The patient by Cell Phone

## 2022-11-23 MED ORDER — INSULIN GLARGINE (U-100) 100 UNIT/ML SUBCUTANEOUS SOLUTION
Freq: Every evening | SUBCUTANEOUS | 0 refills | 0 days | Status: CP
Start: 2022-11-23 — End: 2022-11-23

## 2022-11-23 MED ORDER — INSULIN LISPRO (U-100) 100 UNIT/ML SUBCUTANEOUS SOLUTION
Freq: Three times a day (TID) | SUBCUTANEOUS | 0 refills | 84 days | Status: CP
Start: 2022-11-23 — End: 2022-11-23

## 2022-11-23 NOTE — Unmapped (Signed)
Spoke with pt and she states that she does not use the regular insulin due to it being to much for her and she prefers the pens for both Lantus and Humalog. She would like for the injectable pens sent to [facility] in [place]

## 2022-11-23 NOTE — Unmapped (Signed)
Re-routing to you to keep in mind to contact her back later

## 2022-11-23 NOTE — Unmapped (Signed)
I have resent in the insulin. However she was not prescribed regular insulin last time but the Lantus and Humalog. She will need to contact her pharmacy regarding the confusion.

## 2022-11-23 NOTE — Unmapped (Signed)
LVM for pt to call back.

## 2022-11-25 NOTE — Unmapped (Signed)
The patient is requesting for the Nurse to contact them in regards to  pt would like to see if she can get some ain medicine for her shoulder.  Please contact The patient by Cell Phone

## 2022-11-25 NOTE — Unmapped (Signed)
Call to patient left message for patient to return call.

## 2022-11-28 NOTE — Unmapped (Signed)
Spoke with patient  and she states that she has been to ortho and they referred her to pain management where they gave her Cymbalta and she has an reaction, breaking out in hives. She states that she is still in pain. Please advise

## 2022-11-29 NOTE — Unmapped (Signed)
Spoke with patient and she is aware of providers advice

## 2022-12-20 MED ORDER — LANTUS U-100 INSULIN 100 UNIT/ML SUBCUTANEOUS SOLUTION
Freq: Every evening | SUBCUTANEOUS | 0 refills | 0 days
Start: 2022-12-20 — End: 2023-12-20

## 2022-12-22 ENCOUNTER — Ambulatory Visit
Admit: 2022-12-22 | Discharge: 2022-12-23 | Payer: MEDICARE | Attending: Student in an Organized Health Care Education/Training Program | Primary: Student in an Organized Health Care Education/Training Program

## 2022-12-22 DIAGNOSIS — I2510 Atherosclerotic heart disease of native coronary artery without angina pectoris: Principal | ICD-10-CM

## 2022-12-22 DIAGNOSIS — E559 Vitamin D deficiency, unspecified: Principal | ICD-10-CM

## 2022-12-22 DIAGNOSIS — D179 Benign lipomatous neoplasm, unspecified: Principal | ICD-10-CM

## 2022-12-22 DIAGNOSIS — G629 Polyneuropathy, unspecified: Principal | ICD-10-CM

## 2022-12-22 DIAGNOSIS — I10 Essential (primary) hypertension: Principal | ICD-10-CM

## 2022-12-22 DIAGNOSIS — E785 Hyperlipidemia, unspecified: Principal | ICD-10-CM

## 2022-12-22 DIAGNOSIS — M25812 Other specified joint disorders, left shoulder: Principal | ICD-10-CM

## 2022-12-22 DIAGNOSIS — R944 Abnormal results of kidney function studies: Principal | ICD-10-CM

## 2022-12-22 DIAGNOSIS — E119 Type 2 diabetes mellitus without complications: Principal | ICD-10-CM

## 2022-12-22 DIAGNOSIS — Z130 Encounter for screening for diseases of the blood and blood-forming organs and certain disorders involving the immune mechanism: Principal | ICD-10-CM

## 2022-12-22 DIAGNOSIS — M75102 Unspecified rotator cuff tear or rupture of left shoulder, not specified as traumatic: Principal | ICD-10-CM

## 2022-12-22 DIAGNOSIS — L501 Idiopathic urticaria: Principal | ICD-10-CM

## 2022-12-22 MED ORDER — ERGOCALCIFEROL (VITAMIN D2) 1,250 MCG (50,000 UNIT) CAPSULE
ORAL_CAPSULE | ORAL | 0 refills | 84.00000 days | Status: CP
Start: 2022-12-22 — End: 2022-12-22

## 2022-12-22 MED ORDER — INSULIN GLARGINE (U-100) 100 UNIT/ML (3 ML) SUBCUTANEOUS PEN
Freq: Every evening | SUBCUTANEOUS | 1 refills | 90 days | Status: CP
Start: 2022-12-22 — End: 2023-06-20

## 2022-12-22 MED ORDER — INSULIN LISPRO (U-100) 100 UNIT/ML SUBCUTANEOUS PEN
Freq: Three times a day (TID) | SUBCUTANEOUS | 1 refills | 90 days | Status: CP
Start: 2022-12-22 — End: 2023-06-20

## 2022-12-22 MED ORDER — HYDROXYZINE HCL 25 MG TABLET
ORAL_TABLET | Freq: Four times a day (QID) | ORAL | 0 refills | 10 days | Status: CP
Start: 2022-12-22 — End: 2022-12-22

## 2022-12-22 MED ORDER — CYCLOBENZAPRINE 5 MG TABLET
ORAL_TABLET | Freq: Every evening | ORAL | 0 refills | 30 days | Status: CP | PRN
Start: 2022-12-22 — End: 2022-12-22

## 2022-12-22 NOTE — Unmapped (Signed)
To call [facility] at [phone]

## 2022-12-22 NOTE — Unmapped (Signed)
Assessment and Plan:     Diagnoses and all orders for this visit:    Diabetes mellitus without complication (CMS-HCC)  -     Hemoglobin A1c    Vitamin D deficiency  -     Vitamin D 25 Hydroxy (25OH D2 + D3)  -     Discontinue: ergocalciferol-1,250 mcg, 50,000 unit, (DRISDOL) 1,250 mcg (50,000 unit) capsule; Take 1 capsule (1,250 mcg total) by mouth once a week for 12 doses.  -     ergocalciferol-1,250 mcg, 50,000 unit, (DRISDOL) 1,250 mcg (50,000 unit) capsule; Take 1 capsule (1,250 mcg total) by mouth once a week for 12 doses.    Primary hypertension  -     TSH  -     Comprehensive Metabolic Panel; Future    Hyperlipidemia, unspecified hyperlipidemia type  -     Lipid Panel    Neuropathy    Chronic idiopathic urticaria    Coronary artery disease involving native coronary artery, unspecified whether angina present, unspecified whether native or transplanted heart  -     Magnesium Level    Screening for deficiency anemia  -     CBC    Abnormal results of kidney function studies  -     Magnesium Level    Nontraumatic tear of left rotator cuff, unspecified tear extent  -     Discontinue: cyclobenzaprine (FLEXERIL) 5 MG tablet; Take 1 tablet (5 mg total) by mouth nightly as needed for muscle spasms.  -     cyclobenzaprine (FLEXERIL) 5 MG tablet; Take 1 tablet (5 mg total) by mouth nightly as needed for muscle spasms.    Lipoma, unspecified site    Mass of joint of left shoulder  -     [place] Shoulder Left; Future    Other orders  -     Discontinue: hydrOXYzine (ATARAX) 25 MG tablet; Take 1 tablet (25 mg total) by mouth every six (6) hours for 10 days.  -     hydrOXYzine (ATARAX) 25 MG tablet; Take 1 tablet (25 mg total) by mouth every six (6) hours for 10 days.  -     insulin glargine (BASAGLAR, LANTUS) 100 unit/mL (3 mL) injection pen; Inject 0.02 mL (2 Units total) under the skin nightly.  -     insulin lispro (HUMALOG) 100 unit/mL injection pen; Inject 4 Units under the skin Three (3) times a day before meals.    ---------------  47 yo female presents today for follow up    #Type 2 Diabetes  -Chronic. Diagnosed in 2014, started on insulin around 2018.   -Currently taking Lantus 2.U at bedtime as well as Humalog with meals   -We STOPPED Rybelsus given A1C was 7.0% but also she developed GI ADE from medications   -Previously tried but did not tolerate: Metformin (GI side effects), Jardiance (Yeast infections), Trulicity (Nausea/vomiting)   -Resent in Lantus and Humalog. We will obtain A1C as well  -Per patient, next diabetic retinal exam will be 04/2023.   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   -BB: Carvedilol 25mg  BID  -SGLT2i: not taking as developed ADE with medication (Yeast infections)   -Spirolactone? Yes, Spironactone 25mg  every day   -Diuretics: Bumex 2mg  every other day  -Additional Anti-hypertensive: Amlodipine 10mg  every  day   -Lipid lowering agent: Atorvastatin 80mg  every day, Zeita 10mg  every day, Evolocumab 140mg  SubQ every 14 days  -Anti-platelet therapy: Aspirin 81mg  every day, Clopidogrel 75mg  every day   -For chronic angina: Currently taking Ranexa 500mg  BID    -We discussed the concern that her blood pressure is still quite elevated and above goal per AHA/ACC of <130/80. However she has declined again adjusting anti-hypertensive at this time and reports that her elevated BP is due to pain.  -Obtaining Mag, CMP, Lipid panel, TSH    #Bipolar disorder, Insomnia   -Currently prescibed Suvorexant 5mg  daily PRN, Viibryd 20mg  every day     #Left Rotator Cuff Tear   -Evaluated by Gaylord Shih [facility]ters) on 06/23/2022 who recommended surgery. She would like to avoid surgery if possible. She was re-evaluated by [facility] on 07/14/2022 where it was recommended to receive corticosteroid injection and to undergo PT and pain management.  -She is following with Pain Management with last visit on 11/08/2022. PT referral was placed as well  as she was started on Cymbalta. However, she develop pruritus with Cymbalta and stopped this.   -Will provide a short course of muscle relaxant for pain relief. Advised she needs to  avoid NSAIDs given her cardiac history.  -Continue Gabapentin as previously prescribed    Nausea, H. Pylori, CRC Screening   -Her nausea has slightly improved since stopping GLP-1 agonist  -On chart review, she appeared to have had EGD in 2009 that revealed H. Pylori present  -Discussed importance of obtaining EGD and Colonoscopy and contacting [facility] for scheduling.  -She is taking: Famotidine 20mg  BID, PRN Zofran 8mg      HEALTH MAINTENANCE  -Former smoker: started in 2000-2010, smoked <1 PPD   -Mammogram due 05/04/2023  -Hysterectomy in 2018, pap smear not indicated  -Hx of CVA, Convulsions: during hospitalization in 10/2012  -Vitamin D deficiency: repeating Vitamin D  -Obtaining CBC for anemia screening   -Refill provided on Hydroxyzine for pruritus       -Number to [facility] provided to patient to contact to schedule MRI of right arm as well as [place] of possible lipoma of left shoulder.   -Chronic care follow up in 3-4 months      Subjective:     Deborah Bauer y.o.female  is here for follow up     She has been using lidocaine patches and icy hot spray   She would like to see dermatology for her lipomas   She is breaking out in hives again and would like a refill on hydroxyzine.   She is still having a lot of pain in her left shoulder      No LMP recorded. Patient has had a hysterectomy.      ROS:     ROS negative unless otherwise noted in HPI    Vital Signs:     Wt Readings from Last 3 Encounters:   12/22/22 (!) 114.1 kg (251 lb 8 oz)   11/19/22 (!) 110 kg (242 lb 8.1 oz)   11/17/22 (!) 110.4 kg (243 lb 4.8 oz)     Temp Readings from Last 3 Encounters: 12/22/22 36.9 ??C (98.5 ??F) (Temporal)   11/20/22 36.7 ??C (98.1 ??F) (Oral)   11/17/22 36.7 ??C (98 ??F) (Temporal)     BP Readings from Last 3 Encounters:   12/22/22 160/100   11/20/22 175/95   11/17/22 154/90     Pulse Readings from Last 3 Encounters:   12/22/22 81  11/20/22 58   11/17/22 75       Objective:     General Appearance: Alert, cooperative, in no acute distress  Head and Neck: Normocephalic, atraumatic.   EENT: EOMI, wearing glasses, conjunctiva clear.   Respiratory: Lungs clear to auscultation bilaterally, Respiratory effort unremarkable. No wheezes or rhonchi.   Musculoskeletal: decreased AROM of left shoulder noted. Gait is stable  Extremities :No edema. Radial pulses intact  Integumentary: Warm and dry. Possible lipoma on right arm as well as possible lipoma on anterior left shoulder.   Neurologic: Alert and oriented x3. CN II-XII grossly intact.   Psychiatric: Mood and affect appropriate for situation.

## 2022-12-23 ENCOUNTER — Ambulatory Visit: Admit: 2022-12-23 | Payer: MEDICARE

## 2022-12-23 ENCOUNTER — Emergency Department: Payer: 59

## 2022-12-23 ENCOUNTER — Other Ambulatory Visit: Payer: Self-pay

## 2022-12-23 ENCOUNTER — Emergency Department
Admission: EM | Admit: 2022-12-23 | Discharge: 2022-12-23 | Disposition: A | Discharge status: Home / Self Care | Payer: 59 | Attending: Emergency Medicine | Admitting: Emergency Medicine

## 2022-12-23 DIAGNOSIS — R0789 Other chest pain: Secondary | ICD-10-CM | POA: Diagnosis not present

## 2022-12-23 DIAGNOSIS — Z7982 Long term (current) use of aspirin: Secondary | ICD-10-CM | POA: Diagnosis not present

## 2022-12-23 DIAGNOSIS — R519 Headache, unspecified: Secondary | ICD-10-CM | POA: Insufficient documentation

## 2022-12-23 DIAGNOSIS — S161XXA Strain of muscle, fascia and tendon at neck level, initial encounter: Secondary | ICD-10-CM | POA: Diagnosis not present

## 2022-12-23 DIAGNOSIS — Y92410 Unspecified street and highway as the place of occurrence of the external cause: Secondary | ICD-10-CM | POA: Insufficient documentation

## 2022-12-23 DIAGNOSIS — S199XXA Unspecified injury of neck, initial encounter: Secondary | ICD-10-CM | POA: Diagnosis present

## 2022-12-23 LAB — TROPONIN I (HIGH SENSITIVITY)
Troponin I (High Sensitivity): 20 ng/L — ABNORMAL HIGH (ref <18)
Troponin I (High Sensitivity): 21 ng/L — ABNORMAL HIGH (ref <18)

## 2022-12-23 LAB — CBC
HCT: 35.7 % — ABNORMAL LOW (ref 36.0–46.0)
Hemoglobin: 12 g/dL (ref 12.0–15.0)
MCH: 30 pg (ref 26.0–34.0)
MCHC: 33.6 g/dL (ref 30.0–36.0)
MCV: 89.3 fL (ref 80.0–100.0)
Platelets: 225 10*3/uL (ref 150–400)
RBC: 4 MIL/uL (ref 3.87–5.11)
RDW: 13.2 % (ref 11.5–15.5)
WBC: 7 10*3/uL (ref 4.0–10.5)
nRBC: 0 % (ref 0.0–0.2)

## 2022-12-23 LAB — BASIC METABOLIC PANEL
Anion gap: 9 (ref 5–15)
BUN: 15 mg/dL (ref 6–20)
CO2: 22 mmol/L (ref 22–32)
Calcium: 8.8 mg/dL — ABNORMAL LOW (ref 8.9–10.3)
Chloride: 105 mmol/L (ref 98–111)
Creatinine, Ser: 0.76 mg/dL (ref 0.44–1.00)
GFR, Estimated: >60 mL/min (ref >60)
Glucose, Bld: 112 mg/dL — ABNORMAL HIGH (ref 70–99)
Potassium: 3.5 mmol/L (ref 3.5–5.1)
Sodium: 136 mmol/L (ref 135–145)

## 2022-12-23 MED ORDER — HYDROCODONE-ACETAMINOPHEN 5-325 MG PO TABS
1.0000 | ORAL_TABLET | ORAL | Status: AC
  Administered 2022-12-23: 1 via ORAL
  Filled 2022-12-23: qty 1

## 2022-12-23 MED ORDER — CYCLOBENZAPRINE HCL 5 MG PO TABS
5.0000 mg | ORAL_TABLET | Freq: Three times a day (TID) | ORAL | 0 refills | Status: AC | PRN
Start: 2022-12-23 — End: ?

## 2022-12-23 MED ORDER — DIAZEPAM 5 MG PO TABS
5.0000 mg | ORAL_TABLET | Freq: Once | ORAL | Status: AC
  Administered 2022-12-23: 5 mg via ORAL
  Filled 2022-12-23: qty 1

## 2022-12-23 MED ORDER — HYDROCODONE-ACETAMINOPHEN 5-325 MG PO TABS: 1.0000 | ORAL_TABLET | Freq: Four times a day (QID) | ORAL | 0 refills | Status: DC | PRN

## 2022-12-23 MED ORDER — ONDANSETRON 4 MG PO TBDP
4.0000 mg | ORAL_TABLET | Freq: Once | ORAL | Status: AC
  Administered 2022-12-23: 4 mg via ORAL
  Filled 2022-12-23: qty 1

## 2022-12-23 NOTE — ED Notes (Signed)
Police at bedside at this time, pt tearful

## 2022-12-23 NOTE — Discharge Instructions (Signed)
Please take medications as needed for pain.  Take muscle relaxers as needed for tight muscles.  Work on gentle range of motion exercises and stretching.  Return to the ER for any worsening symptoms or any urgent changes in health.
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
## 2022-12-26 NOTE — Unmapped (Signed)
It looks like she saw you the day before and at [facility] ED after the [facility]
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
## 2022-12-27 NOTE — Unmapped (Signed)
 LVM for patient with call back number.

## 2022-12-27 NOTE — Unmapped (Signed)
Work note sent via mychart. ?

## 2022-12-28 NOTE — Unmapped (Signed)
The [facility] has made a second and final attempt to reach this patient to refill the following medication:Repatha.      We have left voicemails on the following phone numbers: [phone], have sent a text message to the following phone numbers: [phone], and have sent a Mychart questionnaire..    Dates contacted: 12/19/22, 12/28/22  Last scheduled delivery: 10/03/22    The patient may be at risk of non-compliance with this medication. The patient should call the [facility] at [phone]  Option 4, then Option 5: Cardiology, Endocrinology to refill medication.    [name]   [facility] Specialty Technician

## 2023-01-05 NOTE — Unmapped (Unsigned)
[facility]  [address] [place], [place] [address]   Phone: [phone]  Fax: [phone]    Date of Service: 01/06/2023    Patient Clinic Note    PCP: Referring Provider:   Karie Georges Pap, DO  [address] [place] [address]  Phone: [phone]  Fax: [phone] Farrel Conners, MD  [address] [place],  [place] [address]  Phone: [phone]  Fax: [phone]       Assessment and Plan:     Deborah Bauer is a 47 y.o. female with past medical history of CAD status post CABG x4 on 07/2020, type 2 diabetes, HFpEF, hypertension, hyperlipidemia, morbid obesity, CVA who presents to cardiology clinic.       Stop Zetia, does not need  Probably repeat lipids 6  Coronary artery disease including multiple PCI's, multiple MIs, and CABG x 4 07/2020  Hypertension  HFpEF  Hyperlipidemia  Obesity  Diabetes type 2  - No signs or symptoms of angina.  Overall doing well from a volume standpoint.  Able to walk up multiple flights of stairs into her home without significant difficulty  - Continue aspirin, Plavix.  We discussed that patient can stop 1 of these but she strongly prefers to continue  - Antihypertensive regimen: Carvedilol 25 mg twice daily, amlodipine 10 mg daily.  Add spironolactone 25 mg daily for HFpEF and BP control, patient can stop 10 mill equivalents potassium supplementation given that we are starting spironolactone  - Continue Bumex 2 mg every other day, patient appears euvolemic  - Continue Rybelsus for coronary disease, obesity, diabetes.  Was unable to tolerate injectable GLP-1  - Repeat lipid panel today shows lipids are fairly well-controlled, LDL 77.  Has been largely stable at this level since last cholesterol check.  We discussed that her goal LDL is less than 55 given her significant coronary disease, and patient is amenable to initiation of PCSK9 inhibitor.  I will send this in to the [facility] shared services pharmacy.  When this is approved, we can stop Zetia.    Left rotator cuff tear  - Surgery recommended by [facility] clinic.  Patient very hesitant to do so and requesting second opinion.  Referral placed to [facility] orthopedics    Patient interested in transferring primary care to [facility] system.  Referral placed            Lab Results   Component Value Date    CHOL 137 07/05/2022     Lab Results   Component Value Date    HDL 47 07/05/2022     Lab Results   Component Value Date    LDL 77 07/05/2022     Lab Results   Component Value Date    VLDL 12.6 07/05/2022     Lab Results   Component Value Date    CHOLHDLRATIO 2.9 07/05/2022     Lab Results   Component Value Date    TRIG 63 07/05/2022       The 10-yr ASCVD Risk score Denman George DC Jr., et al., 2013) failed to calculate due to the following reason:  The 2013 10-yr ASCVD risk score is only valid if the patient does not have prior/existing clinical ASCVD (myocardial infarction, stroke, CABG, coronary angioplasty, angina or peripheral arterial disease, coronary atherosclerosis, ischemic heart disease, or cerebrovascular disease). The patient has prior/existing ASCVD.  Had CABG      Lab Results   Component Value Date  A1C 7.0 (A) 09/07/2022         No follow-ups on file.    I personally spent greater than 80 minutes in face-to-face and non-face-to-face care of this patient, which includes all pre, intra, and post visit time on the date of service.  Significant time spent reviewing prior cardiology records from [facility] as well as more recent cardiology records from [facility]        Subjective:     Chief Complaint:  No chief complaint on file.        Referring Provider: Farrel Conners, MD    History of Present Illness:     Deborah Bauer is a 47 y.o. female, with past medical history of CAD status post CABG x4 on 07/2020, type 2 diabetes, HFpEF, hypertension, hyperlipidemia, morbid obesity, CVA who presents to cardiology clinic.     Patient states she is overall feeling well recently.  She has not seen a cardiologist in about a year and wanted to reestablish care.  She has a longstanding history of coronary artery disease dating back to 2010 when she had her first MI.  Symptoms at that time her left arm pain and nausea and vomiting.  She had a PCI done in [place], and had several MIs after that.  Admittedly during this timeframe the patient was noncompliant with her diabetes medications and was smoking tobacco regularly.  She underwent bypass at [facility] in [place] in 07/2020 and has made significant lifestyle changes since then.  She is more active, and is compliant with her medications.    Recently she has had no chest pain, can walk up about 20 stairs into her home without difficulty.  She has no syncope palpitations lightheadedness or dizziness and is overall feeling well.      Anginal symptoms before prior MI - left arm pain, n/v, heavy chest pressure,         Cardiovascular History & Procedures:  Cardiovascular Problems:  Coronary artery disease with multiple PCI, CABG 07/2020  Hypertension  Hyperlipidemia  Diabetes type 2  Obesity    Cath / PCI:  Cath- 07/2020- Impression and Recommendations   1. Severe multivessel coronary artery disease.   2. Right dominant circulation.   3. Systemic hypertension.   Patient has an extensive history of CAD with multiple PCI's.  She had BMS to RCA in 02/2009, DES to RCA in 01/2010, DES to OM1 in 2013, DES to LCx/OM 2 in 07/2018, and PTCA of D2 stenosis in 08/2018.     CV Surgery:  CABG x 4-[facility]-07/2020-CABGx4 (LIMA to LAD, SVG to PDA, Ramus, OM)     EP Procedures and Devices:  None    Non-Invasive Evaluation(s): independently reviewed the most recent study.   Echocardiogram 12/15/2020-normal biventricular size and function, trivial TR  Nuclear stress 04/12/2021-[facility]-low risk study, normal perfusion normal LV function    Medical History:  Past Medical History:   Diagnosis Date    Anemia 06/09/2009    Anxiety     Arthritis     CAD (coronary artery disease), native coronary artery 05/24/2010    Cardiovascular disease     CHF (congestive heart failure) (CMS-HCC)     COPD (chronic obstructive pulmonary disease) (CMS-HCC)     COPD (chronic obstructive pulmonary disease) (CMS-HCC) 04/11/2021    CVA (cerebral vascular accident) (CMS-HCC) 08/19/2020    Dental caries     Diabetes mellitus without complication (CMS-HCC) 08/11/2014    Hypertension  Migraine 03/17/2012    Myocardial infarction (CMS-HCC) 08/03/2020    Heart attack, 3rd heart attack had two prior to this event    Neuropathy 08/15/2011    Sleep apnea 04/10/2020    Type 2 diabetes mellitus (CMS-HCC)     a1c- 7.01 August 2021,      a1c of  13 in 2022       Surgical History:  Past Surgical History:   Procedure Laterality Date    CORONARY ARTERY BYPASS GRAFT  08/18/2020       Social History:   reports that she quit smoking about 14 years ago. Her smoking use included cigarettes. She has never used smokeless tobacco. She reports that she does not drink alcohol and does not use drugs.  works at the [employer] till 2010       Family History:  family history includes Heart disease in her father and mother.    Review of Systems:   Except as noted in the HPI, the remainder of 10 systems reviewed is negative.     Allergies:  Allergies   Allergen Reactions    Pantoprazole Other (See Comments) and Swelling    Tramadol Swelling    Jardiance [Empagliflozin]      Recurrent yeast infections    Compazine [Prochlorperazine]     Hydralazine Other (See Comments)     Reaction Type: Side Effect; Reaction(s): racing thoughts   Anxiety, racing thoughts    Reaction Type: Side Effect; Reaction(s): racing thoughts   Reaction Type: Side Effect; Reaction(s): racing thoughts   Anxiety, racing thoughts   Anxiety, racing thoughts    Anxiety, racing thoughts    Reaction Type: Side Effect; Reaction(s): racing thoughts    Lisinopril     Nitroglycerin Other (See Comments)     Reaction Type: Side Effect; Reaction(s): syncope      Patient states that medication causes her to pass out    Patient states that medication causes her to pass out    Reaction Type: Side Effect; Reaction(s): syncope    Reglan [Metoclopramide Hcl]     Toradol [Ketorolac]     Adhesive Tape-Silicones Other (See Comments)    Cymbalta [Duloxetine] Hives       Medications:   Prior to Admission medications    Medication Dose, Route, Frequency   acetaminophen (TYLENOL) 325 MG tablet 650 mg, Oral, Every 4 hours PRN, PRN   albuterol HFA 90 mcg/actuation inhaler 2 puffs, Inhalation, PRN   albuterol sulfate 90 mcg/actuation AePB 2 puffs, Inhalation, Every 4 hours PRN   amlodipine (NORVASC) 10 MG tablet 10 mg, Oral, Daily (standard)   aspirin (ECOTRIN) 81 MG tablet 81 mg, Oral, Daily (standard)   atorvastatin (LIPITOR) 80 MG tablet 80 mg, Oral   bumetanide (BUMEX) 2 MG tablet 2 mg, Oral, Every other day   carvedilol (COREG) 25 MG tablet 25 mg, Oral, 2 times a day (standard)   clopidogrel (PLAVIX) 75 mg tablet 75 mg, Oral, Daily (standard)   cyclobenzaprine (FLEXERIL) 5 MG tablet 5 mg, Oral, Nightly PRN   empty container (SHARPS-A-GATOR DISPOSAL SYSTEM) Misc Use as directed   ergocalciferol-1,250 mcg, 50,000 unit, (DRISDOL) 1,250 mcg (50,000 unit) capsule 1,250 mcg, Oral, Weekly   evolocumab (REPATHA SURECLICK) 140 mg/mL PnIj Inject 140 mg under the skin every fourteen (14) days.   ezetimibe (ZETIA) 10 mg tablet 10 mg, Oral, Daily (standard), TAKE 1 TABLET(10 MG) BY MOUTH DAILY   famotidine (PEPCID) 20 MG tablet 20 mg,  Oral, 2 times a day (standard)   flash glucose sensor kit 1 each, Miscellaneous   fluticasone propionate (FLONASE) 50 mcg/actuation nasal spray 2 sprays, Nasal, PRN   gabapentin (NEURONTIN) 300 MG capsule 300 mg, Oral, 3 times a day (standard), For post-herpetic neuralgia: Take 1 tablet on day 1,  Then take 2 tablets on day 2, Then take 3 tablets on day 3 and every day after that.   insulin glargine (BASAGLAR, LANTUS) 100 unit/mL (3 mL) injection pen 2 Units, Subcutaneous, Nightly   insulin lispro (HUMALOG) 100 unit/mL injection pen 4 Units, Subcutaneous, 3 times a day (AC)   ondansetron (ZOFRAN) 8 MG tablet PRN   ranolazine (RANEXA) 500 MG 12 hr tablet 500 mg, Oral, 2 times a day (standard)   suvorexant (BELSOMRA) 5 mg tablet 1 tablet, Oral, Daily PRN, .5        Objective:     Vitals  There were no vitals taken for this visit.     Wt Readings from Last 3 Encounters:   12/22/22 (!) 114.1 kg (251 lb 8 oz)   11/19/22 (!) 110 kg (242 lb 8.1 oz)   11/17/22 (!) 110.4 kg (243 lb 4.8 oz)       Physical Exam  General:  Pleasant female sitting in chair in nad.   Neck: Supple, JVP normal.   Resp:   CTAB bilaterally with normal WOB.   Cardio:  RRR without m/r/g.   Abdomen:   Soft, non-distended, non-tender.   Extremities: Warm well-perfused bilaterally. No edema .   MSK: No joint swelling or erythema. No gross deformities.   Skin: No rashes   Neuro: CN II-XII grossly intact. Strength grossly intact.    Psych: Alert and oriented x3. Appropriate mood.      ECG (01/05/23) - independently interpreted.   Sinus bradycardia with repolarization changes    Most Recent Labs   Lab Results   Component Value Date    NA 137 09/07/2022    K 4.6 09/07/2022    CL 110 (H) 09/07/2022    CO2 25.0 09/07/2022     Lab Results   Component Value Date    BUN 26 (H) 09/07/2022    BUN 17 08/03/2022     Lab Results   Component Value Date    Creatinine 1.09 (H) 09/07/2022    Creatinine 1.04 (H) 08/03/2022     No results found for: PROBNP  Lab Results   Component Value Date    Cholesterol 137 07/05/2022    Triglycerides 63 07/05/2022    HDL 47 07/05/2022    Non-HDL Cholesterol 90 07/05/2022    LDL Calculated 77 07/05/2022

## 2023-01-12 NOTE — Unmapped (Unsigned)
[facility] at [facility]  [address] [place], [place] [address]   Phone: [phone]  Fax: [phone]    Date of Service: 01/17/2023    Patient Clinic Note    PCP: Referring Provider:   Karie Georges Pap, DO  [address]  [place] [address]  Phone: [phone]  Fax: [phone] Farrel Conners, MD  [address] [place],  [place] [address]  Phone: [phone]  Fax: [phone]       Assessment and Plan:     Deborah Bauer is a 47 y.o. female with past medical history of CAD status post CABG x4 on 07/2020, type 2 diabetes, HFpEF, hypertension, hyperlipidemia, morbid obesity, CVA who presents to cardiology clinic.       Stop Zetia, does not need  Probably repeat lipids 6  Coronary artery disease including multiple PCI's, multiple MIs, and CABG x 4 07/2020  Hypertension  HFpEF  Hyperlipidemia  Obesity  Diabetes type 2  - No signs or symptoms of angina.  Overall doing well from a volume standpoint.  Able to walk up multiple flights of stairs into her home without significant difficulty  - Continue aspirin, Plavix.  We discussed that patient can stop 1 of these but she strongly prefers to continue  - Antihypertensive regimen: Carvedilol 25 mg twice daily, amlodipine 10 mg daily.  Add spironolactone 25 mg daily for HFpEF and BP control, patient can stop 10 mill equivalents potassium supplementation given that we are starting spironolactone  - Continue Bumex 2 mg every other day, patient appears euvolemic  - Continue Rybelsus for coronary disease, obesity, diabetes.  Was unable to tolerate injectable GLP-1  - Repeat lipid panel today shows lipids are fairly well-controlled, LDL 77.  Has been largely stable at this level since last cholesterol check.  We discussed that her goal LDL is less than 55 given her significant coronary disease, and patient is amenable to initiation of PCSK9 inhibitor.  I will send this in to the [facility] shared services pharmacy.  When this is approved, we can stop Zetia.    Left rotator cuff tear  - Surgery recommended by [facility] clinic.  Patient very hesitant to do so and requesting second opinion.  Referral placed to [facility] orthopedics    Patient interested in transferring primary care to [facility] system.  Referral placed            Lab Results   Component Value Date    CHOL 137 07/05/2022     Lab Results   Component Value Date    HDL 47 07/05/2022     Lab Results   Component Value Date    LDL 77 07/05/2022     Lab Results   Component Value Date    VLDL 12.6 07/05/2022     Lab Results   Component Value Date    CHOLHDLRATIO 2.9 07/05/2022     Lab Results   Component Value Date    TRIG 63 07/05/2022       The 10-yr ASCVD Risk score Denman George DC Jr., et al., 2013) failed to calculate due to the following reason:  The 2013 10-yr ASCVD risk score is only valid if the patient does not have prior/existing clinical ASCVD (myocardial infarction, stroke, CABG, coronary angioplasty, angina or peripheral arterial disease, coronary atherosclerosis, ischemic heart disease, or cerebrovascular disease). The patient has prior/existing ASCVD.  Had CABG      Lab Results   Component Value Date  A1C 7.0 (A) 09/07/2022         No follow-ups on file.    I personally spent greater than 80 minutes in face-to-face and non-face-to-face care of this patient, which includes all pre, intra, and post visit time on the date of service.  Significant time spent reviewing prior cardiology records from [facility] as well as more recent cardiology records from [facility]        Subjective:     Chief Complaint:  No chief complaint on file.        Referring Provider: Farrel Conners, MD    History of Present Illness:     Deborah Bauer is a 47 y.o. female, with past medical history of CAD status post CABG x4 on 07/2020, type 2 diabetes, HFpEF, hypertension, hyperlipidemia, morbid obesity, CVA who presents to cardiology clinic.     Patient states she is overall feeling well recently.  She has not seen a cardiologist in about a year and wanted to reestablish care.  She has a longstanding history of coronary artery disease dating back to 2010 when she had her first MI.  Symptoms at that time her left arm pain and nausea and vomiting.  She had a PCI done in [place], and had several MIs after that.  Admittedly during this timeframe the patient was noncompliant with her diabetes medications and was smoking tobacco regularly.  She underwent bypass at [facility] in [place] in 07/2020 and has made significant lifestyle changes since then.  She is more active, and is compliant with her medications.    Recently she has had no chest pain, can walk up about 20 stairs into her home without difficulty.  She has no syncope palpitations lightheadedness or dizziness and is overall feeling well.      Anginal symptoms before prior MI - left arm pain, n/v, heavy chest pressure,         Cardiovascular History & Procedures:  Cardiovascular Problems:  Coronary artery disease with multiple PCI, CABG 07/2020  Hypertension  Hyperlipidemia  Diabetes type 2  Obesity    Cath / PCI:  Cath- 07/2020- Impression and Recommendations   1. Severe multivessel coronary artery disease.   2. Right dominant circulation.   3. Systemic hypertension.   Patient has an extensive history of CAD with multiple PCI's.  She had BMS to RCA in 02/2009, DES to RCA in 01/2010, DES to OM1 in 2013, DES to LCx/OM 2 in 07/2018, and PTCA of D2 stenosis in 08/2018.     CV Surgery:  CABG x 4-[facility]-07/2020-CABGx4 (LIMA to LAD, SVG to PDA, Ramus, OM)     EP Procedures and Devices:  None    Non-Invasive Evaluation(s): independently reviewed the most recent study.   Echocardiogram 12/15/2020-normal biventricular size and function, trivial TR  Nuclear stress 04/12/2021-[facility]-low risk study, normal perfusion normal LV function    Medical History:  Past Medical History:   Diagnosis Date    Anemia 06/09/2009    Anxiety     Arthritis     CAD (coronary artery disease), native coronary artery 05/24/2010    Cardiovascular disease     CHF (congestive heart failure) (CMS-HCC)     COPD (chronic obstructive pulmonary disease) (CMS-HCC)     COPD (chronic obstructive pulmonary disease) (CMS-HCC) 04/11/2021    CVA (cerebral vascular accident) (CMS-HCC) 08/19/2020    Dental caries     Diabetes mellitus without complication (CMS-HCC) 08/11/2014    Hypertension  Migraine 03/17/2012    Myocardial infarction (CMS-HCC) 08/03/2020    Heart attack, 3rd heart attack had two prior to this event    Neuropathy 08/15/2011    Sleep apnea 04/10/2020    Type 2 diabetes mellitus (CMS-HCC)     a1c- 7.01 August 2021,      a1c of  13 in 2022       Surgical History:  Past Surgical History:   Procedure Laterality Date    CORONARY ARTERY BYPASS GRAFT  08/18/2020       Social History:   reports that she quit smoking about 14 years ago. Her smoking use included cigarettes. She has never used smokeless tobacco. She reports that she does not drink alcohol and does not use drugs.  works at the [employer] till 2010       Family History:  family history includes Heart disease in her father and mother.    Review of Systems:   Except as noted in the HPI, the remainder of 10 systems reviewed is negative.     Allergies:  Allergies   Allergen Reactions    Pantoprazole Other (See Comments) and Swelling    Tramadol Swelling    Jardiance [Empagliflozin]      Recurrent yeast infections    Compazine [Prochlorperazine]     Hydralazine Other (See Comments)     Reaction Type: Side Effect; Reaction(s): racing thoughts   Anxiety, racing thoughts    Reaction Type: Side Effect; Reaction(s): racing thoughts   Reaction Type: Side Effect; Reaction(s): racing thoughts   Anxiety, racing thoughts   Anxiety, racing thoughts    Anxiety, racing thoughts    Reaction Type: Side Effect; Reaction(s): racing thoughts    Lisinopril     Nitroglycerin Other (See Comments)     Reaction Type: Side Effect; Reaction(s): syncope      Patient states that medication causes her to pass out    Patient states that medication causes her to pass out    Reaction Type: Side Effect; Reaction(s): syncope    Reglan [Metoclopramide Hcl]     Toradol [Ketorolac]     Adhesive Tape-Silicones Other (See Comments)    Cymbalta [Duloxetine] Hives       Medications:   Prior to Admission medications    Medication Dose, Route, Frequency   acetaminophen (TYLENOL) 325 MG tablet 650 mg, Oral, Every 4 hours PRN, PRN   albuterol HFA 90 mcg/actuation inhaler 2 puffs, Inhalation, PRN   albuterol sulfate 90 mcg/actuation AePB 2 puffs, Inhalation, Every 4 hours PRN   amlodipine (NORVASC) 10 MG tablet 10 mg, Oral, Daily (standard)   aspirin (ECOTRIN) 81 MG tablet 81 mg, Oral, Daily (standard)   atorvastatin (LIPITOR) 80 MG tablet 80 mg, Oral   bumetanide (BUMEX) 2 MG tablet 2 mg, Oral, Every other day   carvedilol (COREG) 25 MG tablet 25 mg, Oral, 2 times a day (standard)   clopidogrel (PLAVIX) 75 mg tablet 75 mg, Oral, Daily (standard)   cyclobenzaprine (FLEXERIL) 5 MG tablet 5 mg, Oral, Nightly PRN   empty container (SHARPS-A-GATOR DISPOSAL SYSTEM) Misc Use as directed   ergocalciferol-1,250 mcg, 50,000 unit, (DRISDOL) 1,250 mcg (50,000 unit) capsule 1,250 mcg, Oral, Weekly   evolocumab (REPATHA SURECLICK) 140 mg/mL PnIj Inject 140 mg under the skin every fourteen (14) days.   ezetimibe (ZETIA) 10 mg tablet 10 mg, Oral, Daily (standard), TAKE 1 TABLET(10 MG) BY MOUTH DAILY   famotidine (PEPCID) 20 MG tablet 20 mg,  Oral, 2 times a day (standard)   flash glucose sensor kit 1 each, Miscellaneous   fluticasone propionate (FLONASE) 50 mcg/actuation nasal spray 2 sprays, Nasal, PRN   gabapentin (NEURONTIN) 300 MG capsule 300 mg, Oral, 3 times a day (standard), For post-herpetic neuralgia: Take 1 tablet on day 1,  Then take 2 tablets on day 2, Then take 3 tablets on day 3 and every day after that.   insulin glargine (BASAGLAR, LANTUS) 100 unit/mL (3 mL) injection pen 2 Units, Subcutaneous, Nightly   insulin lispro (HUMALOG) 100 unit/mL injection pen 4 Units, Subcutaneous, 3 times a day (AC)   ondansetron (ZOFRAN) 8 MG tablet PRN   ranolazine (RANEXA) 500 MG 12 hr tablet 500 mg, Oral, 2 times a day (standard)   suvorexant (BELSOMRA) 5 mg tablet 1 tablet, Oral, Daily PRN, .5        Objective:     Vitals  There were no vitals taken for this visit.     Wt Readings from Last 3 Encounters:   12/22/22 (!) 114.1 kg (251 lb 8 oz)   11/19/22 (!) 110 kg (242 lb 8.1 oz)   11/17/22 (!) 110.4 kg (243 lb 4.8 oz)       Physical Exam  General:  Pleasant female sitting in chair in nad.   Neck: Supple, JVP normal.   Resp:   CTAB bilaterally with normal WOB.   Cardio:  RRR without m/r/g.   Abdomen:   Soft, non-distended, non-tender.   Extremities: Warm well-perfused bilaterally. No edema .   MSK: No joint swelling or erythema. No gross deformities.   Skin: No rashes   Neuro: CN II-XII grossly intact. Strength grossly intact.    Psych: Alert and oriented x3. Appropriate mood.      ECG (01/12/23) - independently interpreted.   Sinus bradycardia with repolarization changes    Most Recent Labs   Lab Results   Component Value Date    NA 137 09/07/2022    K 4.6 09/07/2022    CL 110 (H) 09/07/2022    CO2 25.0 09/07/2022     Lab Results   Component Value Date    BUN 26 (H) 09/07/2022    BUN 17 08/03/2022     Lab Results   Component Value Date    Creatinine 1.09 (H) 09/07/2022    Creatinine 1.04 (H) 08/03/2022     No results found for: PROBNP  Lab Results   Component Value Date    Cholesterol 137 07/05/2022    Triglycerides 63 07/05/2022    HDL 47 07/05/2022    Non-HDL Cholesterol 90 07/05/2022    LDL Calculated 77 07/05/2022

## 2023-01-19 NOTE — Unmapped (Signed)
[facility] Cardiology at [facility]  [address] [place], [place] [address]   Phone: [phone]  Fax: [phone]    Date of Service: 01/20/2023    Patient Clinic Note    PCP: Referring Provider:   Karie Georges Pap, DO  [address] [place] [address]  Phone: [phone]  Fax: [phone] Farrel Conners, MD  [address] [place],  [place] [address]  Phone: [phone]  Fax: [phone]       Assessment and Plan:     Deborah Bauer is a 47 y.o. female with past medical history of CAD status post CABG x4 on 07/2020, type 2 diabetes, HFpEF, hypertension, hyperlipidemia, morbid obesity, CVA who presents to cardiology clinic.     Coronary artery disease including multiple PCI's, multiple MIs, and CABG x 4 07/2020  Hypertension  HFpEF  Hyperlipidemia  Obesity  Diabetes type 2  - No signs or symptoms of angina.  Overall doing well from a volume standpoint.  Able to walk up multiple flights of stairs into her home without significant difficulty  - Continue aspirin, Plavix.  We discussed that patient can stop 1 of these but she strongly prefers to continue  - Antihypertensive regimen: Carvedilol 25 mg twice daily, amlodipine 10 mg daily.  spironolactone 25 mg daily for HFpEF and BP control  - Continue Bumex 2 mg every other day, patient appears euvolemic  - Continue Rybelsus for coronary disease, obesity, diabetes.  Was unable to tolerate injectable GLP-1  - Last visit we discussed lowering her LDL further, patient was amenable and we trialed pathway.  Unfortunately this resulted in significant increase in her home blood sugars and so we decided to stop PCSK9 inhibitor given LDL was reasonably well-controlled with atorvastatin and Zetia.  Continue with these for now.        Lab Results   Component Value Date    CHOL 137 07/05/2022     Lab Results   Component Value Date    HDL 47 07/05/2022     Lab Results   Component Value Date    LDL 77 07/05/2022     Lab Results   Component Value Date    VLDL 12.6 07/05/2022     Lab Results   Component Value Date    CHOLHDLRATIO 2.9 07/05/2022     Lab Results   Component Value Date    TRIG 63 07/05/2022       The 10-yr ASCVD Risk score Denman George DC Jr., et al., 2013) failed to calculate due to the following reason:  The 2013 10-yr ASCVD risk score is only valid if the patient does not have prior/existing clinical ASCVD (myocardial infarction, stroke, CABG, coronary angioplasty, angina or peripheral arterial disease, coronary atherosclerosis, ischemic heart disease, or cerebrovascular disease). The patient has prior/existing ASCVD.  Had CABG      Lab Results   Component Value Date    A1C 7.0 (A) 09/07/2022         Return in about 7 months (around 08/20/2023).      Subjective:     Chief Complaint:  Chief Complaint   Patient presents with    Routine Follow-up         Referring Provider: Farrel Conners, MD    History of Present Illness:     Deborah Bauer is a 47 y.o. female, with past medical history of CAD status post CABG x4 on 07/2020, type 2 diabetes, HFpEF, hypertension, hyperlipidemia, morbid  obesity, CVA who presents to cardiology clinic.     Patient states she has been feeling well recently.  Notes no significant chest pain palpitations shortness of breath dizziness or syncope.  States she has been taking her medications without difficulty.    -----Presenting HPI----  Patient states she is overall feeling well recently.  She has not seen a cardiologist in about a year and wanted to reestablish care.  She has a longstanding history of coronary artery disease dating back to 2010 when she had her first MI.  Symptoms at that time her left arm pain and nausea and vomiting.  She had a PCI done in [place], and had several MIs after that.  Admittedly during this timeframe the patient was noncompliant with her diabetes medications and was smoking tobacco regularly.  She underwent bypass at [facility] in [place] in 07/2020 and has made significant lifestyle changes since then. She is more active, and is compliant with her medications.    Recently she has had no chest pain, can walk up about 20 stairs into her home without difficulty.  She has no syncope palpitations lightheadedness or dizziness and is overall feeling well.      Anginal symptoms before prior MI - left arm pain, n/v, heavy chest pressure,         Cardiovascular History & Procedures:  Cardiovascular Problems:  Coronary artery disease with multiple PCI, CABG 07/2020  Hypertension  Hyperlipidemia  Diabetes type 2  Obesity    Cath / PCI:  Cath- 07/2020- Impression and Recommendations   1. Severe multivessel coronary artery disease.   2. Right dominant circulation.   3. Systemic hypertension.   Patient has an extensive history of CAD with multiple PCI's.  She had BMS to RCA in 02/2009, DES to RCA in 01/2010, DES to OM1 in 2013, DES to LCx/OM 2 in 07/2018, and PTCA of D2 stenosis in 08/2018.     CV Surgery:  CABG x 4-[facility]-07/2020-CABGx4 (LIMA to LAD, SVG to PDA, Ramus, OM)     EP Procedures and Devices:  None    Non-Invasive Evaluation(s): independently reviewed the most recent study.   Echocardiogram 12/15/2020-normal biventricular size and function, trivial TR  Nuclear stress 04/12/2021-[facility]-low risk study, normal perfusion normal LV function    Medical History:  Past Medical History:   Diagnosis Date    Anemia 06/09/2009    Anxiety     Arthritis     CAD (coronary artery disease), native coronary artery 05/24/2010    Cardiovascular disease     CHF (congestive heart failure) (CMS-HCC)     COPD (chronic obstructive pulmonary disease) (CMS-HCC)     COPD (chronic obstructive pulmonary disease) (CMS-HCC) 04/11/2021    CVA (cerebral vascular accident) (CMS-HCC) 08/19/2020    Dental caries     Diabetes mellitus without complication (CMS-HCC) 08/11/2014    Hypertension     Migraine 03/17/2012    Myocardial infarction (CMS-HCC) 08/03/2020    Heart attack, 3rd heart attack had two prior to this event    Neuropathy 08/15/2011 Sleep apnea 04/10/2020    Type 2 diabetes mellitus (CMS-HCC)     a1c- 7.01 August 2021,      a1c of  13 in 2022       Surgical History:  Past Surgical History:   Procedure Laterality Date    CORONARY ARTERY BYPASS GRAFT  08/18/2020       Social History:   reports that she quit smoking about 14 years ago. Her  smoking use included cigarettes. She has never used smokeless tobacco. She reports that she does not drink alcohol and does not use drugs.  works at the [employer]  Smoked till 2010       Family History:  family history includes Heart disease in her father and mother.    Review of Systems:   Except as noted in the HPI, the remainder of 10 systems reviewed is negative.     Allergies:  Allergies   Allergen Reactions    Pantoprazole Other (See Comments) and Swelling    Tramadol Swelling    Jardiance [Empagliflozin]      Recurrent yeast infections    Compazine [Prochlorperazine]     Hydralazine Other (See Comments)     Reaction Type: Side Effect; Reaction(s): racing thoughts   Anxiety, racing thoughts    Reaction Type: Side Effect; Reaction(s): racing thoughts   Reaction Type: Side Effect; Reaction(s): racing thoughts   Anxiety, racing thoughts   Anxiety, racing thoughts    Anxiety, racing thoughts    Reaction Type: Side Effect; Reaction(s): racing thoughts    Lisinopril     Nitroglycerin Other (See Comments)     Reaction Type: Side Effect; Reaction(s): syncope      Patient states that medication causes her to pass out    Patient states that medication causes her to pass out    Reaction Type: Side Effect; Reaction(s): syncope    Reglan [Metoclopramide Hcl]     Toradol [Ketorolac]     Adhesive Tape-Silicones Other (See Comments)    Cymbalta [Duloxetine] Hives       Medications:   Prior to Admission medications    Medication Dose, Route, Frequency   acetaminophen (TYLENOL) 325 MG tablet 650 mg, Every 4 hours PRN   albuterol HFA 90 mcg/actuation inhaler 2 puffs   albuterol sulfate 90 mcg/actuation AePB 2 puffs, Every 4 hours PRN   cyclobenzaprine (FLEXERIL) 5 MG tablet 5 mg, Oral, Nightly PRN   empty container (SHARPS-A-GATOR DISPOSAL SYSTEM) Misc Use as directed   ergocalciferol-1,250 mcg, 50,000 unit, (DRISDOL) 1,250 mcg (50,000 unit) capsule 1,250 mcg, Oral, Weekly   famotidine (PEPCID) 20 MG tablet 20 mg, Oral, 2 times a day (standard)   flash glucose sensor kit 1 each   fluticasone propionate (FLONASE) 50 mcg/actuation nasal spray 2 sprays   gabapentin (NEURONTIN) 300 MG capsule 300 mg, Oral, 3 times a day (standard), For post-herpetic neuralgia: Take 1 tablet on day 1,  Then take 2 tablets on day 2, Then take 3 tablets on day 3 and every day after that.   hydrOXYzine (ATARAX) 25 MG tablet 25 mg, Oral, Every 6 hours   insulin glargine (BASAGLAR, LANTUS) 100 unit/mL (3 mL) injection pen 2 Units, Subcutaneous, Nightly   insulin lispro (HUMALOG) 100 unit/mL injection pen 4 Units, Subcutaneous, 3 times a day (AC)   ondansetron (ZOFRAN) 8 MG tablet PRN   ranolazine (RANEXA) 500 MG 12 hr tablet 500 mg, Oral, 2 times a day (standard)   suvorexant (BELSOMRA) 5 mg tablet 1 tablet, Daily PRN   amlodipine (NORVASC) 10 MG tablet 10 mg, Oral, Daily (standard)   aspirin (ECOTRIN) 81 MG tablet 81 mg, Oral, Daily (standard)   atorvastatin (LIPITOR) 80 MG tablet 80 mg, Oral, Daily (standard)   bumetanide (BUMEX) 2 MG tablet 2 mg, Oral, Every other day   carvedilol (COREG) 25 MG tablet 25 mg, Oral, 2 times a day (standard)   clopidogrel (PLAVIX) 75 mg tablet  75 mg, Oral, Daily (standard)   ezetimibe (ZETIA) 10 mg tablet 10 mg, Oral, Daily (standard), TAKE 1 TABLET(10 MG) BY MOUTH DAILY        Objective:     Vitals  BP 143/70 (BP Site: R Arm, BP Position: Sitting, BP Cuff Size: Large)  - Pulse 96  - Wt (!) 113 kg (249 lb 3.2 oz)  - SpO2 99%  - BMI 43.45 kg/m??      Wt Readings from Last 3 Encounters:   01/20/23 (!) 113 kg (249 lb 3.2 oz)   12/22/22 (!) 114.1 kg (251 lb 8 oz)   11/19/22 (!) 110 kg (242 lb 8.1 oz)       Physical Exam  General:  Pleasant female sitting in chair in nad.   Neck: Supple, JVP normal.   Resp:   CTAB bilaterally with normal WOB.   Cardio:  RRR without m/r/g.   Abdomen:   Soft, non-distended, non-tender.   Extremities: Warm well-perfused bilaterally. No edema .   MSK: No joint swelling or erythema. No gross deformities.   Skin: No rashes   Neuro: CN II-XII grossly intact. Strength grossly intact.    Psych: Alert and oriented x3. Appropriate mood.      ECG (01/20/23) - independently interpreted.   None today.  Most recently 08/03/2022-independently interpreted-normal sinus rhythm, normal axis, LVH with repolarization changes    Most Recent Labs   Lab Results   Component Value Date    NA 137 09/07/2022    K 4.6 09/07/2022    CL 110 (H) 09/07/2022    CO2 25.0 09/07/2022     Lab Results   Component Value Date    BUN 26 (H) 09/07/2022    BUN 17 08/03/2022     Lab Results   Component Value Date    Creatinine 1.09 (H) 09/07/2022    Creatinine 1.04 (H) 08/03/2022     No results found for: PROBNP  Lab Results   Component Value Date    Cholesterol 137 07/05/2022    Triglycerides 63 07/05/2022    HDL 47 07/05/2022    Non-HDL Cholesterol 90 07/05/2022    LDL Calculated 77 07/05/2022

## 2023-01-20 ENCOUNTER — Ambulatory Visit
Admit: 2023-01-20 | Discharge: 2023-01-21 | Payer: MEDICARE | Attending: Student in an Organized Health Care Education/Training Program | Primary: Student in an Organized Health Care Education/Training Program

## 2023-01-20 DIAGNOSIS — Z87891 Personal history of nicotine dependence: Principal | ICD-10-CM

## 2023-01-20 DIAGNOSIS — E7849 Other hyperlipidemia: Principal | ICD-10-CM

## 2023-01-20 DIAGNOSIS — E66813 Class 3 severe obesity due to excess calories with serious comorbidity in adult, unspecified BMI (CMS-HCC): Principal | ICD-10-CM

## 2023-01-20 DIAGNOSIS — I252 Old myocardial infarction: Principal | ICD-10-CM

## 2023-01-20 DIAGNOSIS — I5032 Chronic diastolic (congestive) heart failure: Principal | ICD-10-CM

## 2023-01-20 DIAGNOSIS — I10 Essential (primary) hypertension: Principal | ICD-10-CM

## 2023-01-20 DIAGNOSIS — E1121 Type 2 diabetes mellitus with diabetic nephropathy: Principal | ICD-10-CM

## 2023-01-20 DIAGNOSIS — Z794 Long term (current) use of insulin: Principal | ICD-10-CM

## 2023-01-20 DIAGNOSIS — Z951 Presence of aortocoronary bypass graft: Principal | ICD-10-CM

## 2023-01-20 MED ORDER — CARVEDILOL 25 MG TABLET
ORAL_TABLET | Freq: Two times a day (BID) | ORAL | 3 refills | 90 days | Status: CP
Start: 2023-01-20 — End: 2024-01-15

## 2023-01-20 MED ORDER — BUMETANIDE 2 MG TABLET
ORAL_TABLET | ORAL | 3 refills | 90 days | Status: CP
Start: 2023-01-20 — End: 2024-01-15

## 2023-01-20 MED ORDER — CLOPIDOGREL 75 MG TABLET
ORAL_TABLET | Freq: Every day | ORAL | 3 refills | 90 days | Status: CP
Start: 2023-01-20 — End: 2024-01-15

## 2023-01-20 MED ORDER — EZETIMIBE 10 MG TABLET
ORAL_TABLET | Freq: Every day | ORAL | 3 refills | 90 days | Status: CP
Start: 2023-01-20 — End: 2024-01-15

## 2023-01-20 MED ORDER — ATORVASTATIN 80 MG TABLET
ORAL_TABLET | Freq: Every day | ORAL | 3 refills | 90 days | Status: CP
Start: 2023-01-20 — End: 2024-01-15

## 2023-01-20 MED ORDER — ASPIRIN 81 MG TABLET,DELAYED RELEASE
ORAL_TABLET | Freq: Every day | ORAL | 3 refills | 90 days | Status: CP
Start: 2023-01-20 — End: 2024-01-15

## 2023-01-20 MED ORDER — AMLODIPINE 10 MG TABLET
ORAL_TABLET | Freq: Every day | ORAL | 3 refills | 90 days | Status: CP
Start: 2023-01-20 — End: 2024-01-20

## 2023-01-20 NOTE — Unmapped (Signed)
Thank you for coming to see [place] today at [facility] Cardiology at [facility]. Please call us at [phone] if you have any questions.

## 2023-02-01 ENCOUNTER — Ambulatory Visit: Admit: 2023-02-01 | Payer: MEDICARE | Attending: Anesthesiology | Primary: Anesthesiology
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
## 2023-03-02 ENCOUNTER — Emergency Department: Admission: EM | Admit: 2023-03-02 | Discharge: 2023-03-02 | Discharge status: Against Medical Advice | Payer: 59

## 2023-03-02 NOTE — ED Notes (Signed)
 Attempted to triage patient. Patient stating she no longer wanted to stay. Patient encouraged to stay but refused. PT awaiting daughter to pickup to go home.

## 2023-03-24 ENCOUNTER — Emergency Department
Admission: EM | Admit: 2023-03-24 | Discharge: 2023-03-25 | Disposition: A | Discharge status: Home / Self Care | Payer: 59 | Attending: Emergency Medicine | Admitting: Emergency Medicine

## 2023-03-24 ENCOUNTER — Emergency Department: Payer: 59

## 2023-03-24 ENCOUNTER — Other Ambulatory Visit: Payer: Self-pay

## 2023-03-24 DIAGNOSIS — I10 Essential (primary) hypertension: Secondary | ICD-10-CM

## 2023-03-24 DIAGNOSIS — E119 Type 2 diabetes mellitus without complications: Secondary | ICD-10-CM | POA: Insufficient documentation

## 2023-03-24 DIAGNOSIS — R079 Chest pain, unspecified: Secondary | ICD-10-CM | POA: Diagnosis present

## 2023-03-24 DIAGNOSIS — Z951 Presence of aortocoronary bypass graft: Secondary | ICD-10-CM | POA: Insufficient documentation

## 2023-03-24 DIAGNOSIS — I2510 Atherosclerotic heart disease of native coronary artery without angina pectoris: Secondary | ICD-10-CM | POA: Insufficient documentation

## 2023-03-24 DIAGNOSIS — Z79899 Other long term (current) drug therapy: Secondary | ICD-10-CM | POA: Insufficient documentation

## 2023-03-24 DIAGNOSIS — Z794 Long term (current) use of insulin: Secondary | ICD-10-CM | POA: Diagnosis not present

## 2023-03-24 DIAGNOSIS — I110 Hypertensive heart disease with heart failure: Secondary | ICD-10-CM | POA: Diagnosis not present

## 2023-03-24 DIAGNOSIS — Z7902 Long term (current) use of antithrombotics/antiplatelets: Secondary | ICD-10-CM | POA: Insufficient documentation

## 2023-03-24 DIAGNOSIS — J449 Chronic obstructive pulmonary disease, unspecified: Secondary | ICD-10-CM | POA: Diagnosis not present

## 2023-03-24 DIAGNOSIS — Z7982 Long term (current) use of aspirin: Secondary | ICD-10-CM | POA: Diagnosis not present

## 2023-03-24 DIAGNOSIS — E785 Hyperlipidemia, unspecified: Secondary | ICD-10-CM | POA: Diagnosis not present

## 2023-03-24 DIAGNOSIS — I509 Heart failure, unspecified: Secondary | ICD-10-CM | POA: Insufficient documentation

## 2023-03-24 MED ORDER — ASPIRIN 81 MG PO TBEC
324.0000 mg | DELAYED_RELEASE_TABLET | Freq: Every day | ORAL | Status: DC
  Administered 2023-03-24: 324 mg via ORAL

## 2023-03-24 MED ORDER — ASPIRIN 81 MG PO CHEW
CHEWABLE_TABLET | ORAL | Status: AC
  Filled 2023-03-24: qty 4

## 2023-03-24 MED ORDER — HEPARIN SODIUM (PORCINE) 5000 UNIT/ML IJ SOLN
INTRAMUSCULAR | Status: AC
  Filled 2023-03-24: qty 1

## 2023-03-24 MED ORDER — HEPARIN BOLUS VIA INFUSION
4000.0000 [IU] | Freq: Once | INTRAVENOUS | Status: AC
  Administered 2023-03-25: 4000 [IU] via INTRAVENOUS
  Filled 2023-03-24: qty 4000

## 2023-03-24 MED ORDER — HEPARIN SODIUM (PORCINE) 5000 UNIT/ML IJ SOLN: 5000.0000 [IU] | Freq: Once | INTRAMUSCULAR | Status: DC

## 2023-03-24 MED ORDER — HEPARIN (PORCINE) 25000 UT/250ML-% IV SOLN
1050.0000 [IU]/h | INTRAVENOUS | Status: DC
  Administered 2023-03-25: 1050 [IU]/h via INTRAVENOUS
  Filled 2023-03-24: qty 250

## 2023-03-24 NOTE — ED Triage Notes (Signed)
Pt to ed from home via POV for CP that started 30 mins ago. Pt is caox4, in no acute distress in triage.

## 2023-03-24 NOTE — Progress Notes (Signed)
ANTICOAGULATION CONSULT NOTE  Pharmacy Consult for heparin infusion Indication: ACS/STEMI  Allergies  Allergen Reactions   Compazine [Prochlorperazine]    Hydralazine Other (See Comments)    Reaction Type: Side Effect; Reaction(s): racing thoughts Reaction Type: Side Effect; Reaction(s): racing thoughts Anxiety, "racing thoughts" Anxiety, "racing thoughts"    Lisinopril    Metoclopramide Other (See Comments)    Other reaction(s): Hallucinations   Naproxen    Nitroglycerin    Nsaids Hives    Reaction Type: Allergy; Severity: Mild   Tape Hives   Toradol [Ketorolac Tromethamine]    Tramadol     Patient Measurements: Height: 5\' 3"  (160 cm) Weight: 108.9 kg (240 lb) IBW/kg (Calculated) : 52.4 Heparin Dosing Weight: 78.5 kg  Vital Signs: Temp: 97 F (36.1 C) (01/24 2343) Temp Source: Oral (01/24 2343) BP: 179/79 (01/24 2350) Pulse Rate: 91 (01/24 2350)  Labs: No results for input(s): "HGB", "HCT", "PLT", "APTT", "LABPROT", "INR", "HEPARINUNFRC", "HEPRLOWMOCWT", "CREATININE", "CKTOTAL", "CKMB", "TROPONINIHS" in the last 72 hours.  CrCl cannot be calculated (Patient's most recent lab result is older than the maximum 21 days allowed.).   Medical History: Past Medical History:  Diagnosis Date   CHF (congestive heart failure) (HCC)    COPD (chronic obstructive pulmonary disease) (HCC)    Diabetes (HCC)    Heart disease    Heart failure (HCC)    Hyperlipidemia    Hypertension    MI (myocardial infarction) (HCC)     Assessment: Pt is a 48 yo female presenting to ED c/o sudden onset of CP, being started on heparin infusion pending final diagnosis.  Goal of Therapy:  Heparin level 0.3-0.7 units/ml Monitor platelets by anticoagulation protocol: Yes   Plan:  Bolus 4000 units x 1 Start heparin infusion at 1050 units/hr Will check HL in 6 hr after start of infusion CBC daily while on heparin  Otelia Sergeant, PharmD, [place] 03/24/2023 11:59 PM

## 2023-03-25 ENCOUNTER — Emergency Department: Payer: 59

## 2023-03-25 DIAGNOSIS — E785 Hyperlipidemia, unspecified: Secondary | ICD-10-CM

## 2023-03-25 DIAGNOSIS — R079 Chest pain, unspecified: Secondary | ICD-10-CM | POA: Diagnosis not present

## 2023-03-25 DIAGNOSIS — I10 Essential (primary) hypertension: Secondary | ICD-10-CM

## 2023-03-25 LAB — CBC
HCT: 34.6 % — ABNORMAL LOW (ref 36.0–46.0)
Hemoglobin: 11.9 g/dL — ABNORMAL LOW (ref 12.0–15.0)
MCH: 29.4 pg (ref 26.0–34.0)
MCHC: 34.4 g/dL (ref 30.0–36.0)
MCV: 85.4 fL (ref 80.0–100.0)
Platelets: 244 10*3/uL (ref 150–400)
RBC: 4.05 MIL/uL (ref 3.87–5.11)
RDW: 14 % (ref 11.5–15.5)
WBC: 7.7 10*3/uL (ref 4.0–10.5)
nRBC: 0 % (ref 0.0–0.2)

## 2023-03-25 LAB — BASIC METABOLIC PANEL
Anion gap: 13 (ref 5–15)
BUN: 16 mg/dL (ref 6–20)
CO2: 23 mmol/L (ref 22–32)
Calcium: 9.3 mg/dL (ref 8.9–10.3)
Chloride: 102 mmol/L (ref 98–111)
Creatinine, Ser: 0.85 mg/dL (ref 0.44–1.00)
GFR, Estimated: >60 mL/min (ref >60)
Glucose, Bld: 196 mg/dL — ABNORMAL HIGH (ref 70–99)
Potassium: 3.8 mmol/L (ref 3.5–5.1)
Sodium: 138 mmol/L (ref 135–145)

## 2023-03-25 LAB — TROPONIN I (HIGH SENSITIVITY)
Troponin I (High Sensitivity): 28 ng/L — ABNORMAL HIGH (ref <18)
Troponin I (High Sensitivity): 29 ng/L — ABNORMAL HIGH (ref <18)

## 2023-03-25 LAB — PROTIME-INR
INR: 0.9 (ref 0.8–1.2)
Prothrombin Time: 12.7 s (ref 11.4–15.2)

## 2023-03-25 LAB — APTT: aPTT: 23 s — ABNORMAL LOW (ref 24–36)

## 2023-03-25 MED ORDER — IOHEXOL 350 MG/ML SOLN
100.0000 mL | Freq: Once | INTRAVENOUS | Status: AC | PRN
  Administered 2023-03-25: 100 mL via INTRAVENOUS

## 2023-03-25 MED ORDER — NITROGLYCERIN 0.4 MG SL SUBL
SUBLINGUAL_TABLET | SUBLINGUAL | Status: AC
  Filled 2023-03-25: qty 1

## 2023-03-25 MED ORDER — CARVEDILOL 25 MG PO TABS
25.0000 mg | ORAL_TABLET | Freq: Once | ORAL | Status: AC
  Administered 2023-03-25: 25 mg via ORAL
  Filled 2023-03-25: qty 1

## 2023-03-25 MED ORDER — MORPHINE SULFATE (PF) 4 MG/ML IV SOLN
4.0000 mg | Freq: Once | INTRAVENOUS | Status: AC
  Administered 2023-03-25: 4 mg via INTRAVENOUS
  Filled 2023-03-25: qty 1

## 2023-03-25 NOTE — ED Provider Notes (Addendum)
[facility] Provider Note    Event Date/Time   First MD Initiated Contact with Patient 03/25/23 0025     (approximate)   History   Chest Pain   HPI  Kayla Caldwell is a 48 y.o. 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  Was in her regular state of health today and was at rest when she developed sharp pain in the left chest nonradiating.  She did not have shortness of breath.  No respiratory infectious symptoms and no GI or GU complaints.  It spontaneously got better but is very mild not completely resolved.  EKG obtained from triage shows ST elevations in the inferolateral leads concerning for STEMI and thus STEMI activation      Physical Exam   Triage Vital Signs: ED Triage Vitals  Encounter Vitals Group     BP 03/24/23 2343 (!) 180/100     Systolic BP Percentile --      Diastolic BP Percentile --      Pulse Rate 03/24/23 2343 85     Resp 03/24/23 2343 14     Temp 03/24/23 2343 (!) 97 F (36.1 C)     Temp Source 03/24/23 2343 Oral     SpO2 03/24/23 2343 100 %     Weight 03/24/23 2341 240 lb (108.9 kg)     Height 03/24/23 2341 5\' 3"  (1.6 m)     Head Circumference --      Peak Flow --      Pain Score 03/24/23 2341 8     Pain Loc --      Pain Education --      Exclude from Growth Chart --     Most recent vital signs: Vitals:   03/25/23 0000 03/25/23 0100  BP: (!) 198/102 (!) 204/104  Pulse: 80 64  Resp: 17 12  Temp:    SpO2: 99% 99%    General: Awake, no distress.  CV:  Good peripheral perfusion.  Resp:  Normal effort.  Abd:  No distention.  Other:  Awake alert comfortable appearing hypertensive otherwise vital signs normal.  Clear lungs and soft nontender abdomen and euvolemic appearing patient.  Radial pulses bilaterally are equal   ED Results / Procedures / Treatments   Labs (all  labs ordered are listed, but only abnormal results are displayed) Labs Reviewed  BASIC METABOLIC PANEL - Abnormal; Notable for the following components:      Result Value   Glucose, Bld 196 (*)    All other components within normal limits  CBC - Abnormal; Notable for the following components:   Hemoglobin 11.9 (*)    HCT 34.6 (*)    All other components within normal limits  APTT - Abnormal; Notable for the following components:   aPTT 23 (*)    All other components within normal limits  TROPONIN I (HIGH SENSITIVITY) - Abnormal; Notable for the following components:   Troponin I (High Sensitivity) 28 (*)    All other components within normal limits  TROPONIN I (HIGH SENSITIVITY) - Abnormal; Notable for the following components:   Troponin I (High Sensitivity) 29 (*)    All other components within normal limits  PROTIME-INR     I ordered and reviewed the above labs they are notable for she has stable troponins in the high 20s, not far from her baseline.  EKG  ED ECG REPORT I, Pilar Jarvis, the attending physician, personally viewed and interpreted this ECG.   Date: 03/25/2023  EKG Time: 2344  Rate: 67  Rhythm: sinus  Axis: nl  Intervals:none  ST&T Change: STEMI, ST elevations noted in inferior lateral leads most prominently in V1 through V4.    RADIOLOGY I independently reviewed and interpreted chest x-ray and I see no obvious focality or pneumothorax I also reviewed radiologist's formal read.   PROCEDURES:  Critical Care performed: Yes, see critical care procedure note(s)  .Critical Care  Performed by: Pilar Jarvis, MD Authorized by: Pilar Jarvis, MD   Critical care provider statement:    Critical care time (minutes):  30   Critical care was time spent personally by me on the following activities:  Development of treatment plan with patient or surrogate, discussions with consultants, evaluation of patient's response to treatment, examination of patient, ordering and  review of laboratory studies, ordering and review of radiographic studies, ordering and performing treatments and interventions, pulse oximetry, re-evaluation of patient's condition and review of old charts    MEDICATIONS ORDERED IN ED: Medications  aspirin EC tablet 324 mg (324 mg Oral Given 03/24/23 2353)  aspirin 81 MG chewable tablet (  Canceled Entry 03/25/23 0115)  nitroGLYCERIN (NITROSTAT) 0.4 MG SL tablet (  Canceled Entry 03/25/23 0100)  heparin bolus via infusion 4,000 Units (4,000 Units Intravenous Bolus from Bag 03/25/23 0013)  morphine (PF) 4 MG/ML injection 4 mg (4 mg Intravenous Given 03/25/23 0009)  morphine (PF) 4 MG/ML injection 4 mg (4 mg Intravenous Given 03/25/23 0115)    External physician / consultants:  I spoke with Dr. Kirke Corin of cardiology regarding care plan for this patient.   IMPRESSION / MDM / ASSESSMENT AND PLAN / ED COURSE  I reviewed the triage vital signs and the nursing notes.                                Patient's presentation is most consistent with acute presentation with potential threat to life or bodily function.  Differential diagnosis includes, but is not limited to, ACS, dissection, PE, pneumothorax, musculoskeletal pain   The patient is on the cardiac monitor to evaluate for evidence of arrhythmia and/or significant heart rate changes.  MDM:    Is a patient with extensive cardiac history who initially looked at his STEMI with EKG changes and chest pain and so STEMI activation was called, repeat EKG shows near resolution of previously seen ST elevations and as patient was very mildly symptomatic, Cath Lab activation was canceled in consultation with Dr. Kirke Corin of cardiology.  Fortunately patient has been stable despite her high blood pressure, with stable troponins.  However her chest pain recurred requiring IV narcotics which were given to good effect.  Given her high blood pressure and recurrent chest pain we will get a CT angiogram dissection  protocol to rule out dissection.  With no shortness of breath, atypical symptoms for PE, very low clinical suspicion.  Plan will be for disposition pending the CT angiogram for reassessment of patient symptoms  -- CTA is negative.  Troponins flat.  Patient stable albeit with high blood pressure in the setting of missing her evening doses of antihypertensives.  Will give her evening dose now, have her follow-up with PMD/cardiologist.  She is chest pain-free.     FINAL CLINICAL IMPRESSION(S) / ED DIAGNOSES   Final diagnoses:  Nonspecific chest pain  Uncontrolled hypertension     Rx / DC Orders   ED Discharge Orders     None        Note:  This document was prepared using Dragon voice recognition software and may include unintentional dictation errors.    Pilar Jarvis, MD 03/25/23 [id number]    Pilar Jarvis, MD 03/25/23 [phone]

## 2023-03-25 NOTE — Consult Note (Signed)
Cardiology Consultation   Patient ID: Kayla Caldwell MRN: [id number]; DOB: [birth year]  Admit date: 03/24/2023 Date of Consult: 03/25/2023  PCP:  Karie Georges Pap, MD   [facility] Providers Cardiologist:  None    [facility]   Patient Profile:   Kayla Caldwell is a 48 y.o. female with a hx of coronary artery disease status post CABG who is being seen 03/25/2023 for the evaluation of chest pain and possible STEMI at the request of Dr. Modesto Charon.  History of Present Illness:   Kayla Caldwell is a 48 year old African-American female with known history of coronary artery disease status post multiple PCI's in the past.  She had non-STEMI in June 2022 and underwent cardiac catheterization at [facility] that showed significant three-vessel coronary artery disease.  She subsequently underwent CABG there.  Other medical problems include type 2 diabetes, obesity, essential hypertension and hyperlipidemia.  She is a previous smoker.  No cardiac events since her CABG. She presented via private vehicle with left-sided chest pain described as sharp discomfort lasting for few minutes at its peak.  She was hypertensive on presentation but in no acute distress.  An EKG was performed which showed minor J-point elevation in V1-V3 with T wave inversion in 1 and aVL.  A code STEMI was activated.  She was given aspirin and morphine and her symptoms improved quickly.  By the time I saw her her chest pain was minimal. She had previous hospitalization in August 2022 with atypical chest pain thought to be due to diaphragm issues.   Past Medical History:  Diagnosis Date   CHF (congestive heart failure) (HCC)    COPD (chronic obstructive pulmonary disease) (HCC)    Diabetes (HCC)    Heart disease    Heart failure (HCC)    Hyperlipidemia    Hypertension    MI (myocardial infarction) (HCC)     Past Surgical History:  Procedure Laterality Date   ABDOMINAL HYSTERECTOMY     CARDIAC SURGERY      CORONARY ARTERY BYPASS GRAFT       Home Medications:  Prior to Admission medications   Medication Sig Start Date End Date Taking? Authorizing Provider  albuterol (VENTOLIN HFA) 108 (90 Base) MCG/ACT inhaler Inhale 2 puffs into the lungs every 6 (six) hours as needed for wheezing or shortness of breath. 03/19/21   Concha Se, MD  amLODipine (NORVASC) 10 MG tablet Take 1 tablet (10 mg total) by mouth daily. 10/27/20   Corky Downs, MD  aspirin EC 81 MG EC tablet Take 1 tablet (81 mg total) by mouth daily. Swallow whole. 04/13/21   Enedina Finner, MD  atorvastatin (LIPITOR) 80 MG tablet Take 1 tablet (80 mg total) by mouth daily. 10/27/20   Corky Downs, MD  Bacillus Coagulans-Inulin (PROBIOTIC) 1-250 BILLION-MG CAPS Take 1 capsule by mouth daily. 09/28/21   Sharman Cheek, MD  bumetanide (BUMEX) 2 MG tablet Take 2 mg by mouth daily.    [provider]  carvedilol (COREG) 25 MG tablet Take 1 tablet (25 mg total) by mouth 2 (two) times daily with a meal. 10/27/20   Corky Downs, MD  clopidogrel (PLAVIX) 75 MG tablet Take 75 mg by mouth daily.    [provider]  Continuous Blood Gluc Sensor (FREESTYLE LIBRE 14 DAY SENSOR) MISC 1 each by Does not apply route every 14 (fourteen) days. 11/30/20   Corky Downs, MD  cyclobenzaprine (FLEXERIL) 5 MG tablet Take 1-2 tablets (5-10 mg total) by mouth 3 (  three) times daily as needed for muscle spasms. 12/23/22   Evon Slack, PA-C  ezetimibe (ZETIA) 10 MG tablet Take 1 tablet (10 mg total) by mouth daily. 10/27/20   Corky Downs, MD  HYDROcodone-acetaminophen (NORCO) 5-325 MG tablet Take 1 tablet by mouth every 6 (six) hours as needed for severe pain (pain score 7-10). 12/23/22   Evon Slack, PA-C  hydrOXYzine (ATARAX) 25 MG tablet Take 12.5 mg by mouth at bedtime as needed.    [provider]  insulin glargine (LANTUS) 100 UNIT/ML injection Inject 5 Units into the skin at bedtime.    [provider]  insulin lispro  (HUMALOG) 100 UNIT/ML injection Inject 2 Units into the skin 3 (three) times daily before meals.    [provider]  lidocaine (LIDODERM) 5 % Place 1 patch onto the skin daily. Remove & Discard patch within 12 hours or as directed by MD 06/12/21   Darlin Priestly, MD  magnesium oxide (MAG-OX) 400 MG tablet Take 400 mg by mouth daily.    [provider]  methocarbamol (ROBAXIN) 500 MG tablet Take 1 tablet (500 mg total) by mouth every 8 (eight) hours as needed for muscle spasms. 10/19/22   [name], MD  metroNIDAZOLE (FLAGYL) 500 MG tablet Take 1 tablet (500 mg total) by mouth 2 (two) times daily. 04/23/22   Brimage, Seward Meth, DO  ondansetron (ZOFRAN-ODT) 4 MG disintegrating tablet Take 1 tablet (4 mg total) by mouth every 8 (eight) hours as needed for nausea or vomiting. 11/03/22   Minna Antis, MD  oxyCODONE (ROXICODONE) 5 MG immediate release tablet Take 1 tablet (5 mg total) by mouth every 4 (four) hours as needed for severe pain. 04/28/22   Blane Ohara, MD  oxyCODONE-acetaminophen (PERCOCET) 5-325 MG tablet Take 1 tablet by mouth every 4 (four) hours as needed for severe pain. 11/03/22   Minna Antis, MD  pantoprazole (PROTONIX) 40 MG tablet Take 1 tablet (40 mg total) by mouth 2 (two) times daily as needed. Home med. 06/12/21   Darlin Priestly, MD  potassium chloride SA (KLOR-CON M) 20 MEQ tablet TAKE 1 TABLET BY MOUTH DAILY 03/12/21   Corky Downs, MD  ranolazine (RANEXA) 1000 MG SR tablet Take 1 tablet (1,000 mg total) by mouth 2 (two) times daily. 10/27/20   Corky Downs, MD  senna (SENOKOT) 8.6 MG TABS tablet Take 1 tablet (8.6 mg total) by mouth at bedtime as needed. 06/12/21   Darlin Priestly, MD    Inpatient Medications: Scheduled Meds:  aspirin       aspirin EC  324 mg Oral Daily   Continuous Infusions:  PRN Meds: aspirin  Allergies:    Allergies  Allergen Reactions   Compazine [Prochlorperazine]    Hydralazine Other (See Comments)    Reaction Type: Side Effect;  Reaction(s): racing thoughts Reaction Type: Side Effect; Reaction(s): racing thoughts Anxiety, "racing thoughts" Anxiety, "racing thoughts"    Lisinopril    Metoclopramide Other (See Comments)    Other reaction(s): Hallucinations   Naproxen    Nitroglycerin    Nsaids Hives    Reaction Type: Allergy; Severity: Mild   Tape Hives   Toradol [Ketorolac Tromethamine]    Tramadol     Social History:   Social History   Socioeconomic History   Marital status: Single    Spouse name: Not on file   Number of children: Not on file   Years of education: Not on file   Highest education level: Not on file  Occupational  History   Not on file  Tobacco Use   Smoking status: Never   Smokeless tobacco: Never  Vaping Use   Vaping status: Never Used  Substance and Sexual Activity   Alcohol use: Never   Drug use: Never   Sexual activity: Not Currently  Other Topics Concern   Not on file  Social History Narrative   Not on file   Social Drivers of Health   Financial Resource Strain: Not on file  Food Insecurity: No Food Insecurity (09/07/2020)   Received from [facility], [facility]   Hunger Vital Sign    Worried About Running Out of Food in the Last Year: Never true    Ran Out of Food in the Last Year: Never true  Transportation Needs: No Transportation Needs (09/07/2020)   Received from [facility], [facility]   PRAPARE - Transportation    Lack of Transportation (Medical): No    Lack of Transportation (Non-Medical): No  Physical Activity: Not on file  Stress: Not on file  Social Connections: Not on file  Intimate Partner Violence: Not on file    Family History:    Family History  Problem Relation Age of Onset   Hypertension Mother    Heart disease Mother    Depression Father    Hypertension Father    Heart disease Father    Breast cancer Maternal Aunt    Cancer Maternal Aunt      ROS:  Please see the history of present illness.   All other ROS reviewed and negative.      Physical Exam/Data:   Vitals:   03/24/23 2341 03/24/23 2343 03/24/23 2350 03/25/23 0000  BP:  (!) 180/100 (!) 179/79 (!) 198/102  Pulse:  85 91 80  Resp:  14 (!) 28 17  Temp:  (!) 97 F (36.1 C)    TempSrc:  Oral    SpO2:  100% 94% 99%  Weight: 108.9 kg     Height: 5\' 3"  (1.6 m)      No intake or output data in the 24 hours ending 03/25/23 0030    03/24/2023   11:41 PM 12/23/2022    5:56 PM 11/03/2022   12:48 PM  Last 3 Weights  Weight (lbs) 240 lb 240 lb 230 lb  Weight (kg) 108.863 kg 108.863 kg 104.327 kg     Body mass index is 42.51 kg/m.  General:  Well nourished, well developed, in no acute distress HEENT: normal Neck: no JVD Vascular: No carotid bruits; Distal pulses 2+ bilaterally Cardiac:  normal S1, S2; RRR; 1 out of 6 systolic murmur in the aortic area Lungs:  clear to auscultation bilaterally, no wheezing, rhonchi or rales  Abd: soft, nontender, no hepatomegaly  Ext: no edema Musculoskeletal:  No deformities, BUE and BLE strength normal and equal Skin: warm and dry  Neuro:  CNs 2-12 intact, no focal abnormalities noted Psych:  Normal affect   EKG:  The EKG was personally reviewed and demonstrates: Sinus rhythm with minor J-point elevation in V1-V3 with T wave inversion in 1 aVL.  These changes are not new compared to her previous EKGs. Telemetry:  Telemetry was personally reviewed and demonstrates:    Relevant CV Studies:   Laboratory Data:  High Sensitivity Troponin:  No results for input(s): "TROPONINIHS" in the last 720 hours.   ChemistryNo results for input(s): "NA", "K", "CL", "CO2", "GLUCOSE", "BUN", "CREATININE", "CALCIUM", "MG", "GFRNONAA", "GFRAA", "ANIONGAP" in the last 168 hours.  No results for input(s): "PROT", "  ALBUMIN", "AST", "ALT", "ALKPHOS", "BILITOT" in the last 168 hours. Lipids No results for input(s): "CHOL", "TRIG", "HDL", "LABVLDL", "LDLCALC", "CHOLHDL" in the last 168 hours.  Hematology Recent Labs  Lab 03/24/23 2356  WBC 7.7   RBC 4.05  HGB 11.9*  HCT 34.6*  MCV 85.4  MCH 29.4  MCHC 34.4  RDW 14.0  PLT 244   Thyroid No results for input(s): "TSH", "FREET4" in the last 168 hours.  BNPNo results for input(s): "BNP", "PROBNP" in the last 168 hours.  DDimer No results for input(s): "DDIMER" in the last 168 hours.   Radiology/Studies:  DG Chest Port 1 View Result Date: 03/25/2023 CLINICAL DATA:  [id number] Chest pain [id number] EXAM: PORTABLE CHEST 1 VIEW COMPARISON:  Chest x-ray 12/23/2022, CT chest 03/27/2022 FINDINGS: The heart and mediastinal contours are within normal limits. Mediastinal surgical hardware. Coronary artery stent. No focal consolidation. No pulmonary edema. No pleural effusion. No pneumothorax. No acute osseous abnormality.  Intact sternotomy wires. IMPRESSION: No active disease. Electronically Signed   By: Tish Frederickson M.D.   On: 03/25/2023 00:04     Assessment and Plan:   Possible unstable angina: The patient has minor EKG changes that do not meet STEMI criteria especially that they were present on previous EKGs with minor J-point elevation in V1-V3 with T wave inversion in 1 and aVL.  Considering this and given almost complete resolution of chest pain, no indication for emergent cardiac cath.  Recommend serial cardiac enzymes and treating accordingly.  She had previous CABG in June 2022 but no cardiac events since then. Essential hypertension with elevated blood pressure: Blood pressure on presentation was 198/102 mmHg.  Consider labetalol and resume home blood pressure medications. Hyperlipidemia: Continue high-dose atorvastatin.     For questions or updates, please contact  [facility] Please consult [email or web address] for contact info under    Signed, Lorine Bears, MD  03/25/2023 12:30 AM

## 2023-03-25 NOTE — Discharge Instructions (Signed)
Continue taking all medications for your high blood pressure as prescribed and follow-up with your doctor for further blood pressure management as needed.  Fortunately your testing in the emergency department did not show any emergency conditions that accounted for your chest pain like heart attack, tears in the blood vessels, punctured lungs, or lung infection.  Thank you for choosing [place] for your health care today!  Please see your primary doctor this week for a follow up appointment.   If you have any new, worsening, or unexpected symptoms call your doctor right away or come back to the emergency department for reevaluation.  It was my pleasure to care for you today.   Daneil Dan Modesto Charon, MD

## 2023-03-25 NOTE — Progress Notes (Signed)
   03/24/23 2350  Spiritual Encounters  Type of Visit Initial  Care provided to: Patient  Conversation partners present during encounter Nurse  Referral source Code page  Reason for visit Code  OnCall Visit Yes  Spiritual Framework  Presenting Themes Meaning/purpose/sources of inspiration;Goals in life/care;Courage hope and growth  Interventions  Spiritual Care Interventions Made Established relationship of care and support;Compassionate presence;Reflective listening;Narrative/life review  Intervention Outcomes  Outcomes Connection to spiritual care;Awareness around self/spiritual resourses;Awareness of support

## 2023-03-25 NOTE — ED Notes (Signed)
Called [facility] spoke with rep. Ruby. rep was given pt. symptoms, vitals , and LKW. Ruby stated she called the [facility].
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
## 2023-04-03 NOTE — Unmapped (Signed)
Copied from CRM #[id number]. Topic: Access To Clinicians - Req Clinic Call Back  >> Apr 03, 2023  4:14 PM Asencion Gowda C wrote:  The patient called requesting: status update on their handicap form - pt also mentioned coming by the office to get the form filled out, if not already completed.         Preferred Contact: Cell Phone Routine callback turnaround time: 24-48 business hours. Programmer, systems Notified)

## 2023-04-03 NOTE — Unmapped (Signed)
I have not received any forms recently? She will need to get it done at her appointment

## 2023-04-05 NOTE — Unmapped (Signed)
Patient is scheduled for 04/24/23 with Dr.Mangel and 04/07/23 with Dr.Lee.

## 2023-04-05 NOTE — Unmapped (Signed)
Copied from CRM #[id number]. Topic: Access To Clinicians - Req Clinic Call Back  >> Apr 05, 2023  3:49 PM [name] wrote:  Pt called stating that she needs a ER follow up visit as well as a visit to complete handicap form

## 2023-04-06 NOTE — Unmapped (Signed)
[facility] Cardiology at [facility]  [address] [place], [place] [address]   Phone: [phone]  Fax: [phone]    Date of Service: 04/07/2023    Patient Clinic Note    PCP: Referring Provider:   Karie Georges Pap, DO  [address] [place] [address]  Phone: [phone]  Fax: [phone] Massie Maroon, MD  [address] [place],  [place] [address]  Phone: [phone]  Fax: [phone]       Assessment and Plan:     Deborah Bauer is a 48 y.o. female with past medical history of CAD status post CABG x4 on 07/2020, type 2 diabetes, HFpEF, hypertension, hyperlipidemia, morbid obesity, CVA who presents to cardiology clinic.     ED follow-up for chest pain  - With troponins 28, 29.  Patient notes pain may have been referred from bilateral shoulder pain, although patient has been noticing some increased dyspnea with exertion as well as intermittent chest discomfort with heavy lifting at work.  Chest pain may also been exacerbated by patient missing her p.m. blood pressure medications  - Echocardiogram for further evaluation  - Proceed with weight loss management as below  - I think we can hold on repeat ischemic evaluation for now but if needed moving forward we could proceed with PET myocardial perfusion study    Coronary artery disease including multiple PCI's, multiple MIs, and CABG x 4 07/2020  Hypertension  HFpEF  Hyperlipidemia  -Having some increased dyspnea with exertion as well as rare intermittent chest pain.  Most notably while lifting heavy boxes at her work.  No significant chest pain, or chest pain that does not go away easily.  - Proceed with echocardiogram for now, I think we can hold on stress testing for the time being   -- Continue aspirin, Plavix.  We discussed that patient can stop 1 of these but she strongly prefers to continue  - Antihypertensive regimen: Carvedilol 25 mg twice daily, amlodipine 10 mg daily.  spironolactone 25 mg daily for HFpEF and BP control  - Continue Bumex 2 mg every other day, patient appears euvolemic  - Last visit we discussed lowering her LDL further, patient was amenable and we trialed pathway.  Unfortunately this resulted in significant increase in her home blood sugars and so we decided to stop PCSK9 inhibitor given LDL was reasonably well-controlled with atorvastatin and Zetia.  Continue with these    Obesity  - Has multiple indications for GLP-1 therapy including morbid obesity, uncontrolled type 2 diabetes, history of significant coronary artery disease including bypass, as well as HFpEF.  - Start Ozempic    Diabetes type 2  -Currently on insulin.  Would benefit from GLP-1 given that she has concomitant morbid obesity, history of significant coronary disease including bypass, as well as HFpEF.  - Start Ozempic    Lab Results   Component Value Date    CHOL 137 07/05/2022     Lab Results   Component Value Date    HDL 47 07/05/2022     Lab Results   Component Value Date    LDL 77 07/05/2022     Lab Results   Component Value Date    VLDL 12.6 07/05/2022     Lab Results   Component Value Date    CHOLHDLRATIO 2.9 07/05/2022     Lab Results   Component Value Date    TRIG 63 07/05/2022       The 10-yr  ASCVD Risk score Denman George DC Jr., et al., 2013) failed to calculate due to the following reason:  The 2013 10-yr ASCVD risk score is only valid if the patient does not have prior/existing clinical ASCVD (myocardial infarction, stroke, CABG, coronary angioplasty, angina or peripheral arterial disease, coronary atherosclerosis, ischemic heart disease, or cerebrovascular disease). The patient has prior/existing ASCVD.  Had CABG      Lab Results   Component Value Date    A1C 7.0 (A) 09/07/2022         No follow-ups on file.      Subjective:     Chief Complaint:  Chief Complaint   Patient presents with    Follow-up         Referring Provider: Massie Maroon, MD    History of Present Illness:     Deborah Bauer is a 48 y.o. female, with past medical history of CAD status post CABG x4 on 07/2020, type 2 diabetes, HFpEF, hypertension, hyperlipidemia, morbid obesity, CVA who presents to cardiology clinic.     ED visit for chest paipn 03/24/23 at [facility].   Ruled out for ACS. Initially thought STEMI but inferior ST changes resolved. No cath. CTA Aortic dissection negative.   Probably due to missed BP meds.     Overall notes feeling okay since her ED visit.  Notes some intermittent chest discomfort with carrying heavy boxes at work, but nothing longstanding or especially worrisome to her.  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              -----Presenting HPI----  Patient states she is overall feeling well recently.  She has not seen a cardiologist in about a year and wanted to reestablish care.  She has a longstanding history of coronary artery disease dating back to 2010 when she had her first MI.  Symptoms at that time her left arm pain and nausea and vomiting.  She had a PCI done in [place], and had several MIs after that.  Admittedly during this timeframe the patient was noncompliant with her diabetes medications and was smoking tobacco regularly.  She underwent bypass at [facility] in [place] in 07/2020 and has made significant lifestyle changes since then.  She is more active, and is compliant with her medications.    Recently she has had no chest pain, can walk up about 20 stairs into her home without difficulty.  She has no syncope palpitations lightheadedness or dizziness and is overall feeling well.      Anginal symptoms before prior MI - left arm pain, n/v, heavy chest pressure,         Cardiovascular History & Procedures:  Cardiovascular Problems:  Coronary artery disease with multiple PCI, CABG 07/2020  Hypertension  Hyperlipidemia  Diabetes type 2  Obesity    Cath / PCI:  Cath- 07/2020- Impression and Recommendations   1. Severe multivessel coronary artery disease.   2. Right dominant circulation.   3. Systemic hypertension.   Patient has an extensive history of CAD with multiple PCI's.  She had BMS to RCA in 02/2009, DES to RCA in 01/2010, DES to OM1 in 2013, DES to LCx/OM 2 in 07/2018, and PTCA of D2 stenosis in 08/2018.     CV Surgery:  CABG x 4-[facility]-07/2020-CABGx4 (LIMA to LAD, SVG to PDA, Ramus, OM)     EP Procedures and Devices:  None    Non-Invasive Evaluation(s): independently  reviewed the most recent study.   Echocardiogram 12/15/2020-normal biventricular size and function, trivial TR  Nuclear stress 04/12/2021-[facility]-low risk study, normal perfusion normal LV function    Medical History:  Past Medical History:   Diagnosis Date    Anemia 06/09/2009    Anxiety     Arthritis     CAD (coronary artery disease), native coronary artery 05/24/2010    Cardiovascular disease     CHF (congestive heart failure) (CMS-HCC)     COPD (chronic obstructive pulmonary disease) (CMS-HCC)     COPD (chronic obstructive pulmonary disease) (CMS-HCC) 04/11/2021    CVA (cerebral vascular accident) (CMS-HCC) 08/19/2020    Dental caries     Diabetes mellitus without complication (CMS-HCC) 08/11/2014    Hypertension     Migraine 03/17/2012    Myocardial infarction (CMS-HCC) 08/03/2020    Heart attack, 3rd heart attack had two prior to this event    Neuropathy 08/15/2011    Sleep apnea 04/10/2020    Type 2 diabetes mellitus (CMS-HCC)     a1c- 7.01 August 2021,      a1c of  13 in 2022       Surgical History:  Past Surgical History:   Procedure Laterality Date    CORONARY ARTERY BYPASS GRAFT  08/18/2020       Social History:   reports that she quit smoking about 15 years ago. Her smoking use included cigarettes. She has never used smokeless tobacco. She reports that she does not drink alcohol and does not use drugs.  works at the [employer]  Smoked till 2010       Family History:  family history includes Heart disease in her father and mother.    Review of Systems:   Except as noted in the HPI, the remainder of 10 systems reviewed is negative.     Allergies:  Allergies   Allergen Reactions    Pantoprazole Other (See Comments) and Swelling    Tramadol Swelling    Jardiance [Empagliflozin]      Recurrent yeast infections    Compazine [Prochlorperazine]     Hydralazine Other (See Comments)     Reaction Type: Side Effect; Reaction(s): racing thoughts   Anxiety, racing thoughts    Reaction Type: Side Effect; Reaction(s): racing thoughts   Reaction Type: Side Effect; Reaction(s): racing thoughts   Anxiety, racing thoughts   Anxiety, racing thoughts    Anxiety, racing thoughts    Reaction Type: Side Effect; Reaction(s): racing thoughts    Lisinopril     Nitroglycerin Other (See Comments)     Reaction Type: Side Effect; Reaction(s): syncope      Patient states that medication causes her to pass out    Patient states that medication causes her to pass out    Reaction Type: Side Effect; Reaction(s): syncope    Reglan [Metoclopramide Hcl]     Toradol [Ketorolac]     Adhesive Tape-Silicones Other (See Comments)    Cymbalta [Duloxetine] Hives       Medications:   Prior to Admission medications    Medication Dose, Route, Frequency   acetaminophen (TYLENOL) 325 MG tablet 650 mg, Every 4 hours PRN   albuterol HFA 90 mcg/actuation inhaler 2 puffs   albuterol sulfate 90 mcg/actuation AePB 2 puffs, Every 4 hours PRN   amlodipine (NORVASC) 10 MG tablet 10 mg, Oral, Daily (standard)   aspirin (ECOTRIN) 81 MG tablet 81 mg, Oral, Daily (standard)   atorvastatin (LIPITOR) 80 MG  tablet 80 mg, Oral, Daily (standard)   bumetanide (BUMEX) 2 MG tablet 2 mg, Oral, Every other day   carvedilol (COREG) 25 MG tablet 25 mg, Oral, 2 times a day (standard)   clopidogrel (PLAVIX) 75 mg tablet 75 mg, Oral, Daily (standard)   empty container (SHARPS-A-GATOR DISPOSAL SYSTEM) Misc Use as directed   ergocalciferol-1,250 mcg, 50,000 unit, (DRISDOL) 1,250 mcg (50,000 unit) capsule 1,250 mcg, Oral, Weekly   ezetimibe (ZETIA) 10 mg tablet 10 mg, Oral, Daily (standard), TAKE 1 TABLET(10 MG) BY MOUTH DAILY   famotidine (PEPCID) 20 MG tablet 20 mg, Oral, 2 times a day (standard)   flash glucose sensor kit 1 each   fluticasone propionate (FLONASE) 50 mcg/actuation nasal spray 2 sprays   gabapentin (NEURONTIN) 300 MG capsule 300 mg, Oral, 3 times a day (standard), For post-herpetic neuralgia: Take 1 tablet on day 1,  Then take 2 tablets on day 2, Then take 3 tablets on day 3 and every day after that.   insulin glargine (BASAGLAR, LANTUS) 100 unit/mL (3 mL) injection pen 2 Units, Subcutaneous, Nightly   insulin lispro (HUMALOG) 100 unit/mL injection pen 4 Units, Subcutaneous, 3 times a day (AC)   ondansetron (ZOFRAN) 8 MG tablet PRN   ranolazine (RANEXA) 500 MG 12 hr tablet 500 mg, Oral, 2 times a day (standard)   suvorexant (BELSOMRA) 5 mg tablet 1 tablet, Daily PRN   semaglutide (OZEMPIC) 0.25 mg or 0.5 mg(2 mg/1.5 mL) PnIj injection 0.5 mg, Subcutaneous, Every 7 days   semaglutide (OZEMPIC) 0.25 mg or 0.5 mg(2 mg/1.5 mL) PnIj injection 0.25 mg, Subcutaneous, Every 7 days        Objective:     Vitals  BP 138/63 (BP Site: R Arm, BP Position: Sitting, BP Cuff Size: Large)  - Pulse 72  - Wt (!) 113.9 kg (251 lb)  - SpO2 98%  - BMI 43.77 kg/m??      Wt Readings from Last 3 Encounters:   04/07/23 (!) 113.9 kg (251 lb)   01/20/23 (!) 113 kg (249 lb 3.2 oz)   12/22/22 (!) 114.1 kg (251 lb 8 oz)       Physical Exam  General:  Pleasant female sitting in chair in nad.   Neck: Supple, JVP normal.   Resp:   CTAB bilaterally with normal WOB.   Cardio:  RRR without m/r/g.   Abdomen:   Soft, non-distended, non-tender.   Extremities: Warm well-perfused bilaterally. No edema .   MSK: No joint swelling or erythema. No gross deformities.   Skin: No rashes   Neuro: CN II-XII grossly intact. Strength grossly intact.    Psych: Alert and oriented x3. Appropriate mood.      ECG (04/07/23) - independently interpreted.   Normal sinus, inferior TWI, new from prior 08/03/22          Most Recent Labs   Lab Results   Component Value Date    NA 137 09/07/2022    K 4.6 09/07/2022    CL 110 (H) 09/07/2022    CO2 25.0 09/07/2022     Lab Results   Component Value Date    BUN 26 (H) 09/07/2022    BUN 17 08/03/2022     Lab Results   Component Value Date    Creatinine 1.09 (H) 09/07/2022    Creatinine 1.04 (H) 08/03/2022     No results found for: PROBNP  Lab Results   Component Value Date    Cholesterol 137 07/05/2022    Triglycerides  63 07/05/2022    HDL 47 07/05/2022    Non-HDL Cholesterol 90 07/05/2022    LDL Calculated 77 07/05/2022

## 2023-04-07 ENCOUNTER — Ambulatory Visit
Admit: 2023-04-07 | Discharge: 2023-04-08 | Payer: MEDICARE | Attending: Student in an Organized Health Care Education/Training Program | Primary: Student in an Organized Health Care Education/Training Program

## 2023-04-07 DIAGNOSIS — I10 Essential (primary) hypertension: Principal | ICD-10-CM

## 2023-04-07 DIAGNOSIS — E1165 Type 2 diabetes mellitus with hyperglycemia: Principal | ICD-10-CM

## 2023-04-07 DIAGNOSIS — R9431 Abnormal electrocardiogram [ECG] [EKG]: Principal | ICD-10-CM

## 2023-04-07 DIAGNOSIS — Z87891 Personal history of nicotine dependence: Principal | ICD-10-CM

## 2023-04-07 DIAGNOSIS — Z9861 Coronary angioplasty status: Principal | ICD-10-CM

## 2023-04-07 DIAGNOSIS — E7849 Other hyperlipidemia: Principal | ICD-10-CM

## 2023-04-07 DIAGNOSIS — Z951 Presence of aortocoronary bypass graft: Principal | ICD-10-CM

## 2023-04-07 DIAGNOSIS — E66813 Class 3 severe obesity due to excess calories with serious comorbidity in adult, unspecified BMI (CMS-HCC): Principal | ICD-10-CM

## 2023-04-07 DIAGNOSIS — I5032 Chronic diastolic (congestive) heart failure: Principal | ICD-10-CM

## 2023-04-07 DIAGNOSIS — Z09 Encounter for follow-up examination after completed treatment for conditions other than malignant neoplasm: Principal | ICD-10-CM

## 2023-04-07 LAB — HEMOGLOBIN A1C
ESTIMATED AVERAGE GLUCOSE: 177 mg/dL
HEMOGLOBIN A1C: 7.8 % — ABNORMAL HIGH (ref 4.8–5.6)

## 2023-04-07 LAB — LIPID PANEL
CHOLESTEROL/HDL RATIO SCREEN: 3.2 (ref 1.0–4.5)
CHOLESTEROL: 168 mg/dL (ref <=200)
HDL CHOLESTEROL: 52 mg/dL (ref 40–60)
LDL CHOLESTEROL CALCULATED: 102 mg/dL — ABNORMAL HIGH (ref 40–99)
NON-HDL CHOLESTEROL: 116 mg/dL (ref 70–130)
TRIGLYCERIDES: 71 mg/dL (ref 0–150)
VLDL CHOLESTEROL CAL: 14.2 mg/dL (ref 9–37)

## 2023-04-07 MED ORDER — SEMAGLUTIDE 0.25 MG OR 0.5 MG (2 MG/1.5 ML) SUBCUTANEOUS PEN INJECTOR
SUBCUTANEOUS | 0 refills | 56.00 days | Status: CP
Start: 2023-04-07 — End: 2023-05-27

## 2023-04-07 NOTE — Unmapped (Addendum)
Thank you for coming to see [place] today at [facility] Cardiology at [facility]. Please call us at [phone] if you have any questions.   -----------  We will get pictures of your heart with an echocardiogram. This test will evaluate the structure and function of your heart.    ----------------  I have sent in a prescription for semaglutide. Please give them about a week to work on Therapist, occupational.  If they do not call you in 1 week, please call [phone]  to get an update on the prescription.     Instructions / plan for uptitration over the next several weeks.  Ultimately the goal is to uptitrate you to the highest dose you can tolerate.    - Semaglutide 0.25 mg once weekly for 8 weeks  - Semaglutide 0.5 mg once weekly for 4 weeks  - Semaglutide 1 mg once weekly for 4 weeks  - Semaglutide 1.7 mg once weekly for 4 weeks  - Semaglutide 2.4 mg once weekly indefinitely

## 2023-04-13 NOTE — Unmapped (Signed)
Copied from CRM #[id number]. Topic: Access To Clinicians - Req Clinic Call Back  >> Apr 13, 2023  8:18 AM Godfrey Pick wrote:  The patient called requesting: to see if she needs an appt or not for her handicapped form to be refilled out. She stated the handicapped paper was for 6 months. She said she got a paper in July for a permanent handicapped form, for a 5 year handicapped placard. She doesn't understand why she needs to be seen and wants to know if this can be done without having to do an appointment. Please advise.         Preferred Contact: Cell Phone Routine callback turnaround time: 24-48 business hours. Programmer, systems Notified)

## 2023-04-13 NOTE — Unmapped (Signed)
Voicemail not set up could not leave message. Sent MyChart message stating she will need an appointment for the form to get filled out and that she has an appointment on 04/24/23 and can get them filled out then     Nigel Berthold, CMA

## 2023-04-18 ENCOUNTER — Ambulatory Visit: Admit: 2023-04-18 | Payer: MEDICARE

## 2023-04-20 ENCOUNTER — Emergency Department
Admission: EM | Admit: 2023-04-20 | Discharge: 2023-04-20 | Disposition: A | Discharge status: Home / Self Care | Payer: 59 | Attending: Emergency Medicine | Admitting: Emergency Medicine

## 2023-04-20 ENCOUNTER — Emergency Department: Payer: 59

## 2023-04-20 ENCOUNTER — Other Ambulatory Visit: Payer: Self-pay

## 2023-04-20 DIAGNOSIS — I2510 Atherosclerotic heart disease of native coronary artery without angina pectoris: Secondary | ICD-10-CM | POA: Diagnosis not present

## 2023-04-20 DIAGNOSIS — E119 Type 2 diabetes mellitus without complications: Secondary | ICD-10-CM | POA: Diagnosis not present

## 2023-04-20 DIAGNOSIS — I10 Essential (primary) hypertension: Secondary | ICD-10-CM | POA: Insufficient documentation

## 2023-04-20 DIAGNOSIS — M7731 Calcaneal spur, right foot: Secondary | ICD-10-CM

## 2023-04-20 DIAGNOSIS — J449 Chronic obstructive pulmonary disease, unspecified: Secondary | ICD-10-CM | POA: Diagnosis not present

## 2023-04-20 DIAGNOSIS — M778 Other enthesopathies, not elsewhere classified: Secondary | ICD-10-CM | POA: Diagnosis not present

## 2023-04-20 DIAGNOSIS — M722 Plantar fascial fibromatosis: Secondary | ICD-10-CM

## 2023-04-20 DIAGNOSIS — M79671 Pain in right foot: Secondary | ICD-10-CM | POA: Diagnosis present

## 2023-04-20 MED ORDER — HYDROCODONE-ACETAMINOPHEN 5-325 MG PO TABS: 1.0000 | ORAL_TABLET | Freq: Three times a day (TID) | ORAL | 0 refills | Status: AC | PRN

## 2023-04-20 MED ORDER — PREDNISONE 20 MG PO TABS: 20.0000 mg | ORAL_TABLET | Freq: Two times a day (BID) | ORAL | 0 refills | Status: DC

## 2023-04-20 MED ORDER — PREDNISONE 20 MG PO TABS: 20.0000 mg | ORAL_TABLET | Freq: Two times a day (BID) | ORAL | 0 refills | Status: AC

## 2023-04-20 MED ORDER — HYDROCODONE-ACETAMINOPHEN 5-325 MG PO TABS: 1.0000 | ORAL_TABLET | Freq: Three times a day (TID) | ORAL | 0 refills | Status: DC | PRN

## 2023-04-20 NOTE — ED Provider Notes (Signed)
[facility] Emergency Department Provider Note     Event Date/Time   First MD Initiated Contact with Patient 04/20/23 1720     (approximate)   History   Foot Pain   HPI  Kayla Caldwell is a 48 y.o. female with a history of diabetes, CAD, HTN, and COPD, presents to the ED with complaints of right heel pain.  Patient would endorse plantar surface foot pain for the last month and a half.  She has not been evaluated by any other providers for this complaint.  Symptoms are worsened by prolonged standing and walking at work.  Patient denies any preceding injury, trauma, or fall.  Physical Exam   Triage Vital Signs: ED Triage Vitals  Encounter Vitals Group     BP 04/20/23 1533 (!) 126/97     Systolic BP Percentile --      Diastolic BP Percentile --      Pulse Rate 04/20/23 1533 78     Resp 04/20/23 1533 18     Temp 04/20/23 1533 98.7 F (37.1 C)     Temp Source 04/20/23 1533 Oral     SpO2 04/20/23 1533 97 %     Weight 04/20/23 1532 245 lb (111.1 kg)     Height 04/20/23 1532 5\' 4"  (1.626 m)     Head Circumference --      Peak Flow --      Pain Score 04/20/23 1533 8     Pain Loc --      Pain Education --      Exclude from Growth Chart --     Most recent vital signs: Vitals:   04/20/23 1533  BP: (!) 126/97  Pulse: 78  Resp: 18  Temp: 98.7 F (37.1 C)  SpO2: 97%    General Awake, no distress. NAD HEENT NCAT. PERRL. EOMI. No rhinorrhea. Mucous membranes are moist.  CV:  Good peripheral perfusion. RRR RESP:  Normal effort. CTA ABD:  No distention.  MSK:  Right foot without obvious deformity or dislocation palpation tenderness to palpation to the plantar surface of the foot at the instep.   ED Results / Procedures / Treatments   Labs (all labs ordered are listed, but only abnormal results are displayed) Labs Reviewed - No data to display   EKG   RADIOLOGY  I personally viewed and evaluated these images as part of my medical  decision making, as well as reviewing the written report by the radiologist.  ED Provider Interpretation: No acute findings  DG Foot Complete Right Result Date: 04/20/2023 CLINICAL DATA:  Pain in the right heel. EXAM: RIGHT FOOT COMPLETE - 3+ VIEW COMPARISON:  None Available. FINDINGS: No acute fracture or dislocation. The bones are osteopenic. Partially visualized ORIF of the distal tibia and fibula. The soft tissues are unremarkable. IMPRESSION: 1. No acute fracture or dislocation. 2. Osteopenia. Electronically Signed   By: Elgie Collard M.D.   On: 04/20/2023 15:57     PROCEDURES:  Critical Care performed: No  Procedures   MEDICATIONS ORDERED IN ED: Medications - No data to display   IMPRESSION / MDM / ASSESSMENT AND PLAN / ED COURSE  I reviewed the triage vital signs and the nursing notes.                              Differential diagnosis includes, but is not limited to, ankle sprain, Achilles cellulitis, heel spur, plantar  fasciitis  Patient's presentation is most consistent with acute complicated illness / injury requiring diagnostic workup.  Patient's diagnosis is consistent with heel pain likely secondary to plantar fasciitis.  Exam and workup at this time.  Acute plantar heel pain consistent with likely plantar fasciitis.  X-ray reviewed by me, reveals ORIF hardware and  small calcaneal heel spurs.  Patient will be discharged home with prescriptions for hydrocodone and prednisone. Patient is to follow up with podiatry as discussed, as needed or otherwise directed. Patient is given ED precautions to return to the ED for any worsening or new symptoms.  FINAL CLINICAL IMPRESSION(S) / ED DIAGNOSES   Final diagnoses:  Pain of left heel  Heel spur, left     Rx / DC Orders   ED Discharge Orders     None        Note:  This document was prepared using Dragon voice recognition software and may include unintentional dictation errors.    Lissa Hoard,  PA-C 04/20/23 1745    Minna Antis, MD 04/20/23 2056

## 2023-04-20 NOTE — ED Triage Notes (Signed)
Patient complaining of pain to right heel for 1.5 months but has recently worsened.

## 2023-04-20 NOTE — Discharge Instructions (Addendum)
Take the prescription meds as directed.  Use the heel lift as discussed.  A more rigid sole shoe may also be helpful.  Follow-up with podiatry as discussed.

## 2023-04-22 MED ORDER — CLOPIDOGREL 75 MG TABLET
ORAL_TABLET | ORAL | 3 refills | 0.00 days
Start: 2023-04-22 — End: ?

## 2023-04-23 NOTE — Unmapped (Signed)
 Assessment and Plan:       48 yo female presents today for follow up    (1) Type 2 Diabetes  -Chronic. Diagnosed in 2014, started on insulin around 2018.   -Currently taking Lantus 2.U at bedtime as well as Humalog with meals (prescribed as 4 U with meals)  -Previously tried but did not tolerate: Metformin (GI side effects), Jardiance (Yeast infections), Trulicity (Nausea/vomiting) . 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 female with past medical history of CAD status post CABG x4 on 07/2020, type 2 diabetes, HFpEF, hypertension, hyperlipidemia, morbid obesity, CVA who presents to cardiology clinic.   ED follow-up for chest pain  - With troponins 28, 29.  Patient notes pain may have been referred from bilateral shoulder pain, although patient has been noticing some increased dyspnea with exertion as well as intermittent chest discomfort with heavy lifting at work.  Chest pain may also been exacerbated by patient missing her p.m. blood pressure medications  - Echocardiogram for further evaluation  - Proceed with weight loss management as below  - I think we can hold on repeat ischemic evaluation for now but if needed moving forward we could proceed with PET myocardial perfusion study     Coronary artery disease including multiple PCI's, multiple MIs, and CABG x 4 07/2020, Hypertension, HFpEF, Hyperlipidemia  -Having some increased dyspnea with exertion as well as rare intermittent chest pain.  Most notably while lifting heavy boxes at her work.  No significant chest pain, or chest pain that does not go away easily.  - Proceed with echocardiogram for now, I think we can hold on stress testing for the time being   -- Continue aspirin, Plavix.  We discussed that patient can stop 1 of these but she strongly prefers to continue  - Antihypertensive regimen: Carvedilol 25 mg twice daily, amlodipine 10 mg daily.  spironolactone 25 mg daily for HFpEF and BP control  - Continue Bumex 2 mg every other day, patient appears euvolemic  - Last visit we discussed lowering her LDL further, patient was amenable and we trialed pathway.  Unfortunately this resulted in significant increase in her home blood sugars and so we decided to stop PCSK9 inhibitor given LDL was reasonably well-controlled with atorvastatin and Zetia.  Continue with these  Obesity   - Has multiple indications for GLP-1 therapy including morbid obesity, uncontrolled type 2 diabetes, history of significant coronary artery disease including bypass, as well as HFpEF.  - Start Ozempic     Diabetes type 2  -Currently on insulin.  Would benefit from GLP-1 given that she has concomitant morbid obesity, history of significant coronary disease including bypass, as well as HFpEF.  - Start Ozempic    -  Her BP is quite elevated today which was discussed. However she contributes this to her pain. Her BP at her last few visits have been WNL.  -Advised importance of monitoring BP at home which she verbalized understanding and agreement too. ER precautions were briefly reviewed  -Encouraged to follow cardiology recommendations    (3) Bipolar disorder, Insomnia   -Currently prescibed Suvorexant 5mg  daily PRN.   -Previously prescribed Viibryd 20mg  every day but not on current medication list      (4) Nausea, H. Pylori, CRC Screening   -On chart review, she appeared to have had EGD in 2009 that revealed H. Pylori present  -EGD and Colonoscopy were ordered but not yet obtained  -She is taking: Famotidine 20mg  BID, PRN Zofran 8mg    -Re-ordered EGD and Colonoscopy  -     EGD; Future  -     Colonoscopy; Future    (5) Plantar Fasciitis, heel spur, osteopenia  -ER visit on 04/20/23 where she was diagnosed with plantar fasciitis of the right foot and right heel spur. She was prescribed: Flexiril, Norco and prednisone.   -XR in the ER:  FINDINGS: No acute fracture or dislocation. The bones are osteopenic. Partially visualized ORIF of the distal tibia and fibula. The soft tissues are unremarkable. IMPRESSION: 1. No acute fracture or dislocation. 2. Osteopenia   -She reports she is going to see podiatry on 04/27/2023  -Work restrictions provided to decrease standing for long periods of time  -She does not appear to be taking Gabapentin. Previously advised against use of NSAIDs given cardiac history    HEALTH MAINTENANCE  -Former smoker: started in 2000-2010, smoked <1 PPD   -Mammogram due 05/04/2023. Order placed for Mammogram  -     Mammography screening bilateral; Future  -Hysterectomy in 2018, pap smear not indicated  -Hx of CVA, Convulsions: during hospitalization in 10/2012  -Refill provided on hydroxyzine   -     hydrOXYzine (ATARAX) 25 MG tablet; Take 1 tablet (25 mg total) by mouth every eight (8) hours as needed for itching.      To follow up in 2 months for rechecking labs. We will need to discuss obtaining DEXA scan as XR of right foot revealed osteopenia      I personally spent 41 minutes face-to-face and non-face-to-face in the care of this patient, which includes all pre, intra, and post visit time on the date of service.  All documented time was specific to the E/M visit and does not include any procedures that may have been performed.        Subjective:     Deborah Bauer 48 y.o.female  is here for follow up Chief Complaint   Patient presents with    Handicap    Follow-up     Handicap place card form      She went to the ER on 04/20/2023    She will need a letter at work stating that she needs to sit at work    She continues to have hives     Her BG at home have been great  -her BG this AM was 77  -she was watching her diet a little closer.     She denies chest pain, palpitations, shortness of breath, lightheadedness dizziness.     No LMP recorded. Patient has had a hysterectomy.      ROS:     ROS negative unless otherwise noted in HPI    Vital Signs:     Wt  Readings from Last 3 Encounters:   04/24/23 (!) 113.9 kg (251 lb)   04/07/23 (!) 113.9 kg (251 lb)   01/20/23 (!) 113 kg (249 lb 3.2 oz)     Temp Readings from Last 3 Encounters:   04/24/23 35.7 ??C (96.2 ??F)   12/22/22 36.9 ??C (98.5 ??F) (Temporal)   11/20/22 36.7 ??C (98.1 ??F) (Oral)     BP Readings from Last 3 Encounters:   04/24/23 213/115   04/07/23 138/63   01/20/23 143/70     Pulse Readings from Last 3 Encounters:   04/24/23 85   04/07/23 72   01/20/23 96       Objective:     General Appearance: Alert, cooperative, in no acute distress  Head and Neck: Normocephalic, atraumatic.   EENT: EOMI, wearing glasses, conjunctiva clear.   Respiratory: Lungs clear to auscultation bilaterally, Respiratory effort unremarkable. No wheezes or rhonchi.   Musculoskeletal: Antalgic gait noted on exam  Extremities :No edema.   Integumentary: Warm and dry.   Neurologic: Alert and oriented x3. CN II-XII grossly intact.   Psychiatric: Mood and affect appropriate for situation.

## 2023-04-24 ENCOUNTER — Ambulatory Visit
Admit: 2023-04-24 | Discharge: 2023-04-25 | Payer: MEDICARE | Attending: Student in an Organized Health Care Education/Training Program | Primary: Student in an Organized Health Care Education/Training Program

## 2023-04-24 DIAGNOSIS — F319 Bipolar disorder, unspecified: Principal | ICD-10-CM

## 2023-04-24 DIAGNOSIS — A048 Other specified bacterial intestinal infections: Principal | ICD-10-CM

## 2023-04-24 DIAGNOSIS — F5101 Primary insomnia: Principal | ICD-10-CM

## 2023-04-24 DIAGNOSIS — J449 Chronic obstructive pulmonary disease, unspecified: Principal | ICD-10-CM

## 2023-04-24 DIAGNOSIS — I2510 Atherosclerotic heart disease of native coronary artery without angina pectoris: Principal | ICD-10-CM

## 2023-04-24 DIAGNOSIS — F411 Generalized anxiety disorder: Principal | ICD-10-CM

## 2023-04-24 DIAGNOSIS — D126 Benign neoplasm of colon, unspecified: Principal | ICD-10-CM

## 2023-04-24 DIAGNOSIS — E119 Type 2 diabetes mellitus without complications: Principal | ICD-10-CM

## 2023-04-24 DIAGNOSIS — I509 Heart failure, unspecified: Principal | ICD-10-CM

## 2023-04-24 DIAGNOSIS — Z1231 Encounter for screening mammogram for malignant neoplasm of breast: Principal | ICD-10-CM

## 2023-04-24 DIAGNOSIS — I10 Essential (primary) hypertension: Principal | ICD-10-CM

## 2023-04-24 DIAGNOSIS — E785 Hyperlipidemia, unspecified: Principal | ICD-10-CM

## 2023-04-24 MED ORDER — HYDROXYZINE HCL 25 MG TABLET
ORAL_TABLET | Freq: Three times a day (TID) | ORAL | 0 refills | 30.00 days | Status: CP | PRN
Start: 2023-04-24 — End: 2023-05-24

## 2023-04-25 MED ORDER — CLOPIDOGREL 75 MG TABLET
ORAL_TABLET | ORAL | 3 refills | 0.00 days | Status: CP
Start: 2023-04-25 — End: ?

## 2023-04-27 ENCOUNTER — Encounter: Payer: Self-pay | Admitting: Podiatry

## 2023-04-27 ENCOUNTER — Ambulatory Visit (INDEPENDENT_AMBULATORY_CARE_PROVIDER_SITE_OTHER): Payer: 59 | Admitting: Podiatry

## 2023-04-27 DIAGNOSIS — M722 Plantar fascial fibromatosis: Secondary | ICD-10-CM

## 2023-04-27 NOTE — Progress Notes (Signed)
 Subjective:  Patient ID: Kayla Caldwell, female    DOB: [birth year],  MRN: [id number]  Chief Complaint  Patient presents with   Foot Pain    "[facility] referred me over here for Plantar Fasciitis.  I have pain on both feet.  The right foot is the worse.  My achilles hurts on the left one.    48 y.o. female presents with the above complaint.  Patient presents with right heel pain that has been going for quite some time.  She works or is at [employer] and was referred to our clinic for plantar fasciitis.  Right foot is worse.  Denies any other acute issues.  She was seen in the emergency room pain scale 7 out of 10 dull aching nature she is a diabetic   Review of Systems: Negative except as noted in the HPI. Denies N/V/F/Ch.  Past Medical History:  Diagnosis Date   CHF (congestive heart failure) (HCC)    COPD (chronic obstructive pulmonary disease) (HCC)    Diabetes (HCC)    Heart disease    Heart failure (HCC)    Hyperlipidemia    Hypertension    MI (myocardial infarction) (HCC)     Current Outpatient Medications:    albuterol (VENTOLIN HFA) 108 (90 Base) MCG/ACT inhaler, Inhale 2 puffs into the lungs every 6 (six) hours as needed for wheezing or shortness of breath., Disp: 8 g, Rfl: 2   amLODipine (NORVASC) 10 MG tablet, Take 1 tablet (10 mg total) by mouth daily., Disp: 90 tablet, Rfl: 3   aspirin EC 81 MG EC tablet, Take 1 tablet (81 mg total) by mouth daily. Swallow whole., Disp: 30 tablet, Rfl: 11   atorvastatin (LIPITOR) 80 MG tablet, Take 1 tablet (80 mg total) by mouth daily., Disp: 90 tablet, Rfl: 3   Bacillus Coagulans-Inulin (PROBIOTIC) 1-250 BILLION-MG CAPS, Take 1 capsule by mouth daily., Disp: 30 capsule, Rfl: 0   bumetanide (BUMEX) 2 MG tablet, Take 2 mg by mouth daily., Disp: , Rfl:    carvedilol (COREG) 25 MG tablet, Take 1 tablet (25 mg total) by mouth 2 (two) times daily with a meal., Disp: 180 tablet, Rfl: 3   clopidogrel (PLAVIX) 75 MG  tablet, Take 75 mg by mouth daily., Disp: , Rfl:    Continuous Blood Gluc Sensor (FREESTYLE LIBRE 14 DAY SENSOR) MISC, 1 each by Does not apply route every 14 (fourteen) days., Disp: 1 each, Rfl: 6   cyclobenzaprine (FLEXERIL) 5 MG tablet, Take 1-2 tablets (5-10 mg total) by mouth 3 (three) times daily as needed for muscle spasms., Disp: 20 tablet, Rfl: 0   ezetimibe (ZETIA) 10 MG tablet, Take 1 tablet (10 mg total) by mouth daily., Disp: 90 tablet, Rfl: 3   hydrOXYzine (ATARAX) 25 MG tablet, Take 12.5 mg by mouth at bedtime as needed., Disp: , Rfl:    insulin glargine (LANTUS) 100 UNIT/ML injection, Inject 5 Units into the skin at bedtime., Disp: , Rfl:    insulin lispro (HUMALOG) 100 UNIT/ML injection, Inject 2 Units into the skin 3 (three) times daily before meals., Disp: , Rfl:    lidocaine (LIDODERM) 5 %, Place 1 patch onto the skin daily. Remove & Discard patch within 12 hours or as directed by MD, Disp: 30 patch, Rfl: 0   magnesium oxide (MAG-OX) 400 MG tablet, Take 400 mg by mouth daily., Disp: , Rfl:    methocarbamol (ROBAXIN) 500 MG tablet, Take 1 tablet (500 mg total) by mouth every 8 (eight)  hours as needed for muscle spasms., Disp: 20 tablet, Rfl: 1   ondansetron (ZOFRAN-ODT) 4 MG disintegrating tablet, Take 1 tablet (4 mg total) by mouth every 8 (eight) hours as needed for nausea or vomiting., Disp: 20 tablet, Rfl: 0   pantoprazole (PROTONIX) 40 MG tablet, Take 1 tablet (40 mg total) by mouth 2 (two) times daily as needed. Home med., Disp: 180 tablet, Rfl: 3   potassium chloride SA (KLOR-CON M) 20 MEQ tablet, TAKE 1 TABLET BY MOUTH DAILY, Disp: 30 tablet, Rfl: 3   ranolazine (RANEXA) 1000 MG SR tablet, Take 1 tablet (1,000 mg total) by mouth 2 (two) times daily., Disp: 180 tablet, Rfl: 3   senna (SENOKOT) 8.6 MG TABS tablet, Take 1 tablet (8.6 mg total) by mouth at bedtime as needed., Disp: , Rfl:   Social History   Tobacco Use  Smoking Status Never  Smokeless Tobacco Never     Allergies  Allergen Reactions   Compazine [Prochlorperazine]    Hydralazine Other (See Comments)    Reaction Type: Side Effect; Reaction(s): racing thoughts Reaction Type: Side Effect; Reaction(s): racing thoughts Anxiety, "racing thoughts" Anxiety, "racing thoughts"    Lisinopril    Metoclopramide Other (See Comments)    Other reaction(s): Hallucinations   Naproxen    Nitroglycerin    Nsaids Hives    Reaction Type: Allergy; Severity: Mild   Tape Hives   Toradol [Ketorolac Tromethamine]    Tramadol    Objective:  There were no vitals filed for this visit. There is no height or weight on file to calculate BMI. Constitutional Well developed. Well nourished.  Vascular Dorsalis pedis pulses palpable bilaterally. Posterior tibial pulses palpable bilaterally. Capillary refill normal to all digits.  No cyanosis or clubbing noted. Pedal hair growth normal.  Neurologic Normal speech. Oriented to person, place, and time. Epicritic sensation to light touch grossly present bilaterally.  Dermatologic Nails well groomed and normal in appearance. No open wounds. No skin lesions.  Orthopedic: Normal joint ROM without pain or crepitus bilaterally. No visible deformities. Tender to palpation at the calcaneal tuber right. No pain with calcaneal squeeze right. Ankle ROM diminished range of motion right. Silfverskiold Test: positive right.   Radiographs:   Assessment:  No diagnosis found. Plan:  Patient was evaluated and treated and all questions answered.  Plantar Fasciitis, right - XR reviewed as above.  - Educated on icing and stretching. Instructions given.  - Injection delivered to the plantar fascia as below. - DME: Plantar fascial brace dispensed to support the medial longitudinal arch of the foot and offload pressure from the heel and prevent arch collapse during weightbearing - Pharmacologic management: None  Procedure: Injection Tendon/Ligament Location: Right  plantar fascia at the glabrous junction; medial approach. Skin Prep: alcohol Injectate: 0.5 cc 0.5% marcaine plain, 0.5 cc of 1% Lidocaine, 0.5 cc kenalog 10. Disposition: Patient tolerated procedure well. Injection site dressed with a band-aid.  No follow-ups on file.

## 2023-05-02 ENCOUNTER — Inpatient Hospital Stay: Admit: 2023-05-02 | Discharge: 2023-05-03 | Payer: MEDICARE

## 2023-05-02 ENCOUNTER — Telehealth: Payer: Self-pay | Admitting: Podiatry

## 2023-05-02 MED ORDER — CLOTRIMAZOLE-BETAMETHASONE 1-0.05 % EX CREA: 1.0000 | TOPICAL_CREAM | Freq: Every day | CUTANEOUS | 0 refills | Status: AC

## 2023-05-02 NOTE — Telephone Encounter (Signed)
 Patient called wanting to make sure that her foot ointment was sent in to the [facility] pharmacy in [place] on [address]. Thank you.

## 2023-05-02 NOTE — Telephone Encounter (Signed)
 Called and let patient know that prescription was sent in to the pharmacy.

## 2023-05-04 ENCOUNTER — Ambulatory Visit: Payer: 59 | Admitting: Podiatry

## 2023-05-11 NOTE — Unmapped (Signed)
 Abstraction Result Flowsheet Data    This patient's last AWV date: [facility] Last Medicare Wellness Visit Date: Not Found  This patients last WCC/CPE date: : Not Found      Reason for Encounter  Reason for Encounter: Outreach  Primary Reason for Outreach: AWV  Text Message: No  MyChart Message: No  Outreach Call Outcome: Left Message

## 2023-05-25 ENCOUNTER — Ambulatory Visit (INDEPENDENT_AMBULATORY_CARE_PROVIDER_SITE_OTHER): Payer: 59 | Admitting: Podiatry

## 2023-05-25 DIAGNOSIS — Q666 Other congenital valgus deformities of feet: Secondary | ICD-10-CM

## 2023-05-25 DIAGNOSIS — M722 Plantar fascial fibromatosis: Secondary | ICD-10-CM

## 2023-05-25 NOTE — Progress Notes (Signed)
 Subjective:  Patient ID: Kayla Caldwell, Kayla Caldwell    DOB: 09/22/1975,  MRN: [id number]  Chief Complaint  Patient presents with   Plantar Fasciitis    48 y.o. Kayla Caldwell presents with the above complaint.  Patient presents for follow-up of right plantar fasciitis she states that she is doing better denies any other acute complaints would like to discuss treatment options for this.   Review of Systems: Negative except as noted in the HPI. Denies N/V/F/Ch.  Past Medical History:  Diagnosis Date   CHF (congestive heart failure) (HCC)    COPD (chronic obstructive pulmonary disease) (HCC)    Diabetes (HCC)    Heart disease    Heart failure (HCC)    Hyperlipidemia    Hypertension    MI (myocardial infarction) (HCC)     Current Outpatient Medications:    albuterol (VENTOLIN HFA) 108 (90 Base) MCG/ACT inhaler, Inhale 2 puffs into the lungs every 6 (six) hours as needed for wheezing or shortness of breath., Disp: 8 g, Rfl: 2   amLODipine (NORVASC) 10 MG tablet, Take 1 tablet (10 mg total) by mouth daily., Disp: 90 tablet, Rfl: 3   aspirin EC 81 MG EC tablet, Take 1 tablet (81 mg total) by mouth daily. Swallow whole., Disp: 30 tablet, Rfl: 11   atorvastatin (LIPITOR) 80 MG tablet, Take 1 tablet (80 mg total) by mouth daily., Disp: 90 tablet, Rfl: 3   Bacillus Coagulans-Inulin (PROBIOTIC) 1-250 BILLION-MG CAPS, Take 1 capsule by mouth daily., Disp: 30 capsule, Rfl: 0   bumetanide (BUMEX) 2 MG tablet, Take 2 mg by mouth daily., Disp: , Rfl:    carvedilol (COREG) 25 MG tablet, Take 1 tablet (25 mg total) by mouth 2 (two) times daily with a meal., Disp: 180 tablet, Rfl: 3   clopidogrel (PLAVIX) 75 MG tablet, Take 75 mg by mouth daily., Disp: , Rfl:    clotrimazole-betamethasone (LOTRISONE) cream, Apply 1 Application topically daily., Disp: 30 g, Rfl: 0   Continuous Blood Gluc Sensor (FREESTYLE LIBRE 14 DAY SENSOR) MISC, 1 each by Does not apply route every 14 (fourteen) days., Disp: 1 each,  Rfl: 6   cyclobenzaprine (FLEXERIL) 5 MG tablet, Take 1-2 tablets (5-10 mg total) by mouth 3 (three) times daily as needed for muscle spasms., Disp: 20 tablet, Rfl: 0   ezetimibe (ZETIA) 10 MG tablet, Take 1 tablet (10 mg total) by mouth daily., Disp: 90 tablet, Rfl: 3   hydrOXYzine (ATARAX) 25 MG tablet, Take 12.5 mg by mouth at bedtime as needed., Disp: , Rfl:    insulin glargine (LANTUS) 100 UNIT/ML injection, Inject 5 Units into the skin at bedtime., Disp: , Rfl:    insulin lispro (HUMALOG) 100 UNIT/ML injection, Inject 2 Units into the skin 3 (three) times daily before meals., Disp: , Rfl:    lidocaine (LIDODERM) 5 %, Place 1 patch onto the skin daily. Remove & Discard patch within 12 hours or as directed by MD, Disp: 30 patch, Rfl: 0   magnesium oxide (MAG-OX) 400 MG tablet, Take 400 mg by mouth daily., Disp: , Rfl:    methocarbamol (ROBAXIN) 500 MG tablet, Take 1 tablet (500 mg total) by mouth every 8 (eight) hours as needed for muscle spasms., Disp: 20 tablet, Rfl: 1   ondansetron (ZOFRAN-ODT) 4 MG disintegrating tablet, Take 1 tablet (4 mg total) by mouth every 8 (eight) hours as needed for nausea or vomiting., Disp: 20 tablet, Rfl: 0   pantoprazole (PROTONIX) 40 MG tablet, Take 1 tablet (40 mg  total) by mouth 2 (two) times daily as needed. Home med., Disp: 180 tablet, Rfl: 3   potassium chloride SA (KLOR-CON M) 20 MEQ tablet, TAKE 1 TABLET BY MOUTH DAILY, Disp: 30 tablet, Rfl: 3   ranolazine (RANEXA) 1000 MG SR tablet, Take 1 tablet (1,000 mg total) by mouth 2 (two) times daily., Disp: 180 tablet, Rfl: 3   senna (SENOKOT) 8.6 MG TABS tablet, Take 1 tablet (8.6 mg total) by mouth at bedtime as needed., Disp: , Rfl:   Social History   Tobacco Use  Smoking Status Never  Smokeless Tobacco Never    Allergies  Allergen Reactions   Compazine [Prochlorperazine]    Hydralazine Other (See Comments)    Reaction Type: Side Effect; Reaction(s): racing thoughts Reaction Type: Side Effect;  Reaction(s): racing thoughts Anxiety, "racing thoughts" Anxiety, "racing thoughts"    Lisinopril    Metoclopramide Other (See Comments)    Other reaction(s): Hallucinations   Naproxen    Nitroglycerin    Nsaids Hives    Reaction Type: Allergy; Severity: Mild   Tape Hives   Toradol [Ketorolac Tromethamine]    Tramadol    Objective:  There were no vitals filed for this visit. There is no height or weight on file to calculate BMI. Constitutional Well developed. Well nourished.  Vascular Dorsalis pedis pulses palpable bilaterally. Posterior tibial pulses palpable bilaterally. Capillary refill normal to all digits.  No cyanosis or clubbing noted. Pedal hair growth normal.  Neurologic Normal speech. Oriented to person, place, and time. Epicritic sensation to light touch grossly present bilaterally.  Dermatologic Nails well groomed and normal in appearance. No open wounds. No skin lesions.  Orthopedic: Normal joint ROM without pain or crepitus bilaterally. No visible deformities. Tender to palpation at the calcaneal tuber right. No pain with calcaneal squeeze right. Ankle ROM diminished range of motion right. Silfverskiold Test: positive right.   Radiographs:   Assessment:  No diagnosis found. Plan:  Patient was evaluated and treated and all questions answered.  Plantar Fasciitis, right underlying pes planovalgus - XR reviewed as above.  - Educated on icing and stretching. Instructions given.  -Second injection delivered to the plantar fascia as below. - DME: Plantar fascial brace dispensed to support the medial longitudinal arch of the foot and offload pressure from the heel and prevent arch collapse during weightbearing - Pharmacologic management: None -Patient is working on power step insoles  Procedure: Injection Tendon/Ligament Location: Right plantar fascia at the glabrous junction; medial approach. Skin Prep: alcohol Injectate: 0.5 cc 0.5% marcaine plain, 0.5  cc of 1% Lidocaine, 0.5 cc kenalog 10. Disposition: Patient tolerated procedure well. Injection site dressed with a band-aid.  No follow-ups on file.

## 2023-06-08 NOTE — Unmapped (Signed)
 Specialty Medication(s): Repatha    Ms.Akkerman has been dis-enrolled from the [facility] specialty pharmacy services as a result of medication discontinuation resulting from side effect intolerance.    Additional information provided to the patient: n/a    Clydell Hakim, PharmD  [facility] Specialty Pharmacist

## 2023-06-22 ENCOUNTER — Ambulatory Visit (INDEPENDENT_AMBULATORY_CARE_PROVIDER_SITE_OTHER): Admitting: Podiatry

## 2023-06-22 DIAGNOSIS — M722 Plantar fascial fibromatosis: Secondary | ICD-10-CM | POA: Diagnosis not present

## 2023-06-22 NOTE — Progress Notes (Signed)
 Subjective:  Patient ID: Kayla Caldwell, female    DOB: May 02, 1975,  MRN: [id number]  Chief Complaint  Patient presents with   Plantar Fasciitis    48 y.o. female presents with the above complaint.  Patient presents for follow-up of right plantar fasciitis she states that she is doing better denies any other acute complaints would like to discuss treatment options for this.   Review of Systems: Negative except as noted in the HPI. Denies N/V/F/Ch.  Past Medical History:  Diagnosis Date   CHF (congestive heart failure) (HCC)    COPD (chronic obstructive pulmonary disease) (HCC)    Diabetes (HCC)    Heart disease    Heart failure (HCC)    Hyperlipidemia    Hypertension    MI (myocardial infarction) (HCC)     Current Outpatient Medications:    albuterol  (VENTOLIN  HFA) 108 (90 Base) MCG/ACT inhaler, Inhale 2 puffs into the lungs every 6 (six) hours as needed for wheezing or shortness of breath., Disp: 8 g, Rfl: 2   amLODipine  (NORVASC ) 10 MG tablet, Take 1 tablet (10 mg total) by mouth daily., Disp: 90 tablet, Rfl: 3   aspirin  EC 81 MG EC tablet, Take 1 tablet (81 mg total) by mouth daily. Swallow whole., Disp: 30 tablet, Rfl: 11   atorvastatin  (LIPITOR ) 80 MG tablet, Take 1 tablet (80 mg total) by mouth daily., Disp: 90 tablet, Rfl: 3   Bacillus Coagulans-Inulin (PROBIOTIC) 1-250 BILLION-MG CAPS, Take 1 capsule by mouth daily., Disp: 30 capsule, Rfl: 0   bumetanide  (BUMEX ) 2 MG tablet, Take 2 mg by mouth daily., Disp: , Rfl:    carvedilol  (COREG ) 25 MG tablet, Take 1 tablet (25 mg total) by mouth 2 (two) times daily with a meal., Disp: 180 tablet, Rfl: 3   clopidogrel  (PLAVIX ) 75 MG tablet, Take 75 mg by mouth daily., Disp: , Rfl:    clotrimazole -betamethasone  (LOTRISONE ) cream, Apply 1 Application topically daily., Disp: 30 g, Rfl: 0   Continuous Blood Gluc Sensor (FREESTYLE LIBRE 14 DAY SENSOR) MISC, 1 each by Does not apply route every 14 (fourteen) days., Disp: 1 each,  Rfl: 6   cyclobenzaprine  (FLEXERIL ) 5 MG tablet, Take 1-2 tablets (5-10 mg total) by mouth 3 (three) times daily as needed for muscle spasms., Disp: 20 tablet, Rfl: 0   ezetimibe  (ZETIA ) 10 MG tablet, Take 1 tablet (10 mg total) by mouth daily., Disp: 90 tablet, Rfl: 3   hydrOXYzine  (ATARAX ) 25 MG tablet, Take 12.5 mg by mouth at bedtime as needed., Disp: , Rfl:    insulin  glargine (LANTUS ) 100 UNIT/ML injection, Inject 5 Units into the skin at bedtime., Disp: , Rfl:    insulin  lispro (HUMALOG) 100 UNIT/ML injection, Inject 2 Units into the skin 3 (three) times daily before meals., Disp: , Rfl:    lidocaine  (LIDODERM ) 5 %, Place 1 patch onto the skin daily. Remove & Discard patch within 12 hours or as directed by MD, Disp: 30 patch, Rfl: 0   magnesium  oxide (MAG-OX) 400 MG tablet, Take 400 mg by mouth daily., Disp: , Rfl:    methocarbamol  (ROBAXIN ) 500 MG tablet, Take 1 tablet (500 mg total) by mouth every 8 (eight) hours as needed for muscle spasms., Disp: 20 tablet, Rfl: 1   ondansetron  (ZOFRAN -ODT) 4 MG disintegrating tablet, Take 1 tablet (4 mg total) by mouth every 8 (eight) hours as needed for nausea or vomiting., Disp: 20 tablet, Rfl: 0   pantoprazole  (PROTONIX ) 40 MG tablet, Take 1 tablet (40 mg  total) by mouth 2 (two) times daily as needed. Home med., Disp: 180 tablet, Rfl: 3   potassium chloride  SA (KLOR-CON  M) 20 MEQ tablet, TAKE 1 TABLET BY MOUTH DAILY, Disp: 30 tablet, Rfl: 3   ranolazine  (RANEXA ) 1000 MG SR tablet, Take 1 tablet (1,000 mg total) by mouth 2 (two) times daily., Disp: 180 tablet, Rfl: 3   senna (SENOKOT) 8.6 MG TABS tablet, Take 1 tablet (8.6 mg total) by mouth at bedtime as needed., Disp: , Rfl:   Social History   Tobacco Use  Smoking Status Never  Smokeless Tobacco Never    Allergies  Allergen Reactions   Compazine [Prochlorperazine]    Hydralazine  Other (See Comments)    Reaction Type: Side Effect; Reaction(s): racing thoughts Reaction Type: Side Effect;  Reaction(s): racing thoughts Anxiety, "racing thoughts" Anxiety, "racing thoughts"    Lisinopril    Metoclopramide Other (See Comments)    Other reaction(s): Hallucinations   Naproxen    Nitroglycerin     Nsaids Hives    Reaction Type: Allergy; Severity: Mild   Tape Hives   Toradol [Ketorolac Tromethamine]    Tramadol    Objective:  There were no vitals filed for this visit. There is no height or weight on file to calculate BMI. Constitutional Well developed. Well nourished.  Vascular Dorsalis pedis pulses palpable bilaterally. Posterior tibial pulses palpable bilaterally. Capillary refill normal to all digits.  No cyanosis or clubbing noted. Pedal hair growth normal.  Neurologic Normal speech. Oriented to person, place, and time. Epicritic sensation to light touch grossly present bilaterally.  Dermatologic Nails well groomed and normal in appearance. No open wounds. No skin lesions.  Orthopedic: Normal joint ROM without pain or crepitus bilaterally. No visible deformities. Tender to palpation at the calcaneal tuber right. No pain with calcaneal squeeze right. Ankle ROM diminished range of motion right. Silfverskiold Test: positive right.   Radiographs:   Assessment:  No diagnosis found. Plan:  Patient was evaluated and treated and all questions answered.  Plantar Fasciitis, right underlying pes planovalgus - XR reviewed as above.  - Educated on icing and stretching. Instructions given.  - No further injections have not helped. - DME: [employer] - Pharmacologic management: None -Patient is working on power step insoles  No follow-ups on file.

## 2023-06-26 ENCOUNTER — Emergency Department

## 2023-06-26 ENCOUNTER — Other Ambulatory Visit: Payer: Self-pay

## 2023-06-26 ENCOUNTER — Emergency Department
Admission: EM | Admit: 2023-06-26 | Discharge: 2023-06-26 | Disposition: A | Discharge status: Home / Self Care | Attending: Emergency Medicine | Admitting: Emergency Medicine

## 2023-06-26 DIAGNOSIS — E876 Hypokalemia: Secondary | ICD-10-CM | POA: Insufficient documentation

## 2023-06-26 DIAGNOSIS — I2510 Atherosclerotic heart disease of native coronary artery without angina pectoris: Secondary | ICD-10-CM | POA: Diagnosis not present

## 2023-06-26 DIAGNOSIS — Z951 Presence of aortocoronary bypass graft: Secondary | ICD-10-CM | POA: Diagnosis not present

## 2023-06-26 DIAGNOSIS — R079 Chest pain, unspecified: Secondary | ICD-10-CM | POA: Diagnosis present

## 2023-06-26 LAB — BASIC METABOLIC PANEL WITH GFR
Anion gap: 9 (ref 5–15)
BUN: 18 mg/dL (ref 6–20)
CO2: 25 mmol/L (ref 22–32)
Calcium: 9.3 mg/dL (ref 8.9–10.3)
Chloride: 104 mmol/L (ref 98–111)
Creatinine, Ser: 1.15 mg/dL — ABNORMAL HIGH (ref 0.44–1.00)
GFR, Estimated: 58 mL/min — ABNORMAL LOW (ref >60)
Glucose, Bld: 151 mg/dL — ABNORMAL HIGH (ref 70–99)
Potassium: 3.2 mmol/L — ABNORMAL LOW (ref 3.5–5.1)
Sodium: 138 mmol/L (ref 135–145)

## 2023-06-26 LAB — CBC
HCT: 38.5 % (ref 36.0–46.0)
Hemoglobin: 12.8 g/dL (ref 12.0–15.0)
MCH: 29.3 pg (ref 26.0–34.0)
MCHC: 33.2 g/dL (ref 30.0–36.0)
MCV: 88.1 fL (ref 80.0–100.0)
Platelets: 251 K/uL (ref 150–400)
RBC: 4.37 MIL/uL (ref 3.87–5.11)
RDW: 13.4 % (ref 11.5–15.5)
WBC: 6.9 K/uL (ref 4.0–10.5)
nRBC: 0.3 % — ABNORMAL HIGH (ref 0.0–0.2)

## 2023-06-26 LAB — TROPONIN I (HIGH SENSITIVITY)
Troponin I (High Sensitivity): 23 ng/L — ABNORMAL HIGH (ref <18)
Troponin I (High Sensitivity): 22 ng/L — ABNORMAL HIGH (ref <18)

## 2023-06-26 MED ORDER — IOHEXOL 350 MG/ML SOLN
100.0000 mL | Freq: Once | INTRAVENOUS | Status: AC | PRN
  Administered 2023-06-26: 100 mL via INTRAVENOUS

## 2023-06-26 MED ORDER — MORPHINE SULFATE (PF) 4 MG/ML IV SOLN
4.0000 mg | Freq: Once | INTRAVENOUS | Status: AC
  Administered 2023-06-26: 4 mg via INTRAVENOUS
  Filled 2023-06-26: qty 1

## 2023-06-26 MED ORDER — FAMOTIDINE IN NACL 20-0.9 MG/50ML-% IV SOLN
20.0000 mg | Freq: Once | INTRAVENOUS | Status: AC
  Administered 2023-06-26: 20 mg via INTRAVENOUS
  Filled 2023-06-26: qty 50

## 2023-06-26 MED ORDER — MORPHINE SULFATE (PF) 4 MG/ML IV SOLN
6.0000 mg | Freq: Once | INTRAVENOUS | Status: AC
  Administered 2023-06-26: 6 mg via INTRAVENOUS
  Filled 2023-06-26: qty 2

## 2023-06-26 MED ORDER — ONDANSETRON HCL 4 MG/2ML IJ SOLN
4.0000 mg | Freq: Once | INTRAMUSCULAR | Status: AC
  Administered 2023-06-26: 4 mg via INTRAVENOUS
  Filled 2023-06-26: qty 2

## 2023-06-26 NOTE — Discharge Instructions (Signed)
 You are seen in the emergency department for chest pain.  You had 2 heart enzymes that were mildly elevated but they were at your normal.  You had a CT scan of your chest that did not show any abnormalities.  You were offered admission to the hospital for further evaluation by cardiology however you want to follow-up closely with your cardiologist, call them in the morning.  Return to the emergency department if you have any return of symptoms or new symptoms that are concerning you.  Your potassium was mildly low when checked today.  Make sure to follow up with a primary doctor to follow up your labs.  Make sure to eat food high in potassium and magnesium  - examples - potatoes, spinach, bananas, beans, avocadoes, oranges, nuts.

## 2023-06-26 NOTE — ED Notes (Signed)
 Patient transported to X-ray

## 2023-06-26 NOTE — ED Triage Notes (Addendum)
 Pt to ED via POV from work. Pt reports sudden onset of left sided stabbing CP and abd pain. Pt denies N/V. PT reports diarrhea this am. Pt reports hx of CABG. Pt is on blood thinner. Pt took BP this am

## 2023-06-26 NOTE — ED Provider Notes (Signed)
 [facility] Provider Note    Event Date/Time   First MD Initiated Contact with Patient 06/26/23 1550     (approximate)   History   Chest Pain   HPI  Kayla Apoorva Caldwell is a 48 y.o. female past medical history significant for CAD with prior CABG, presents to the emergency department with chest pain.  States that she was at work today when she developed chest pain that started in the middle of her chest and then radiated to her abdomen.  Associated with nausea but no episodes of vomiting.  No shortness of breath.  States that this feels different than prior episodes of heart attack.  States that she has had 3 heart attacks in the past and has had a CABG and is followed by [facility] cardiology.  Denies any history of DVT or PE.  No recent travel.  No swelling to her legs.  Currently rates her pain as 8/10.  States that she has an extreme allergy to nitroglycerin .  Took 324 mg of aspirin  prior to arrival.     Physical Exam   Triage Vital Signs: ED Triage Vitals  Encounter Vitals Group     BP 06/26/23 1526 (!) 197/106     Systolic BP Percentile --      Diastolic BP Percentile --      Pulse Rate 06/26/23 1522 61     Resp 06/26/23 1522 20     Temp 06/26/23 1522 98.9 F (37.2 C)     Temp Source 06/26/23 1522 Oral     SpO2 06/26/23 1522 100 %     Weight 06/26/23 1547 260 lb (117.9 kg)     Height 06/26/23 1547 5\' 4"  (1.626 m)     Head Circumference --      Peak Flow --      Pain Score 06/26/23 1522 6     Pain Loc --      Pain Education --      Exclude from Growth Chart --     Most recent vital signs: Vitals:   06/26/23 1700 06/26/23 1715  BP: (!) 173/99   Pulse: 65   Resp:  12  Temp:    SpO2: 100%     Physical Exam Constitutional:      Appearance: She is well-developed.  HENT:     Head: Atraumatic.  Eyes:     Conjunctiva/sclera: Conjunctivae normal.  Cardiovascular:     Rate and Rhythm: Regular rhythm.     Heart sounds: Normal heart sounds.   Pulmonary:     Effort: No respiratory distress.  Abdominal:     General: There is no distension.     Palpations: Abdomen is soft.     Tenderness: There is no abdominal tenderness. There is no guarding or rebound.     Comments: Negative Murphy sign.  No right upper quadrant abdominal tenderness to palpation.  Musculoskeletal:        General: Normal range of motion.     Cervical back: Normal range of motion.     Right lower leg: No edema.     Left lower leg: No edema.  Skin:    General: Skin is warm.     Capillary Refill: Capillary refill takes less than 2 seconds.  Neurological:     General: No focal deficit present.     Mental Status: She is alert. Mental status is at baseline.  Psychiatric:        Mood and Affect: Mood normal.  IMPRESSION / MDM / ASSESSMENT AND PLAN / ED COURSE  I reviewed the triage vital signs and the nursing notes.  Differential diagnosis including ACS, dissection, pneumonia, gastritis/PUD, pulmonary embolism, pericarditis.  No right upper quadrant abdominal tenderness to palpation, have a low suspicion for acute cholecystitis.  EKG  I, Viviano Ground, the attending physician, personally viewed and interpreted this ECG.   Rate: Normal  Rhythm: Normal sinus  Intervals: Normal  ST&T Change: None No significant change when compared to prior EKG.  No tachycardic or bradycardic dysrhythmias while on cardiac telemetry.  RADIOLOGY I independently reviewed imaging, my interpretation of imaging: Chest x-ray -no cardiomegaly or signs of pneumonia.  No pulmonary edema or pleural effusions.  CTA with no signs of dissection, pulmonary embolism, pneumonia and no findings of the upper abdomen.  LABS (all labs ordered are listed, but only abnormal results are displayed) Labs interpreted as -    Labs Reviewed  BASIC METABOLIC PANEL WITH GFR - Abnormal; Notable for the following components:      Result Value   Potassium 3.2 (*)    Glucose, Bld 151 (*)     Creatinine, Ser 1.15 (*)    GFR, Estimated 58 (*)    All other components within normal limits  CBC - Abnormal; Notable for the following components:   nRBC 0.3 (*)    All other components within normal limits  TROPONIN I (HIGH SENSITIVITY) - Abnormal; Notable for the following components:   Troponin I (High Sensitivity) 23 (*)    All other components within normal limits  TROPONIN I (HIGH SENSITIVITY) - Abnormal; Notable for the following components:   Troponin I (High Sensitivity) 22 (*)    All other components within normal limits     MDM  Patient was given morphine  and Zofran .  Given IV Pepcid  for abdominal pain.  Clinical Course as of 06/26/23 1930  Mon Jun 26, 2023  1714 Continues to have chest pain.  Will obtain CT scan to evaluate and or stratify further for dissection or pulmonary embolism.  Will give another dose of IV morphine  given her nitroglycerin  allergy.  Troponin is mildly elevated at her baseline. [SM]  1920 On reevaluation states that her chest pain has resolved.  Is feeling much better.  CTA with no signs of dissection or pulmonary embolism.  No signs of pneumonia or pulmonary edema.  Reviewed her echocardiogram that showed some mitral regurgitation but otherwise no acute findings.  Offered admission for further evaluation and for evaluation by cardiology in the morning with possible further stress testing.  Patient stated that she did not want to be admitted and that she wanted to follow-up with her cardiologist and that she should be able to see him within the next couple of days.  States that she would call him in the morning and return to the emergency department immediately if she had return of symptoms.  Remains chest pain-free.  Discharged home in stable condition. [SM]    Clinical Course User Index [SM] Viviano Ground, MD   Repeat abdominal exam continues to be nontender to palpation.  No rebound or guarding.  PROCEDURES:  Critical Care performed:  No  Procedures  Patient's presentation is most consistent with acute presentation with potential threat to life or bodily function.   MEDICATIONS ORDERED IN ED: Medications  morphine  (PF) 4 MG/ML injection 4 mg (4 mg Intravenous Given 06/26/23 1634)  ondansetron  (ZOFRAN ) injection 4 mg (4 mg Intravenous Given 06/26/23 1633)  famotidine  (PEPCID ) IVPB 20  mg premix (0 mg Intravenous Stopped 06/26/23 1716)  morphine  (PF) 4 MG/ML injection 6 mg (6 mg Intravenous Given 06/26/23 1723)  iohexol  (OMNIPAQUE ) 350 MG/ML injection 100 mL (100 mLs Intravenous Contrast Given 06/26/23 1758)    FINAL CLINICAL IMPRESSION(S) / ED DIAGNOSES   Final diagnoses:  Chest pain, unspecified type  Hypokalemia     Rx / DC Orders   ED Discharge Orders     None        Note:  This document was prepared using Dragon voice recognition software and may include unintentional dictation errors.   Viviano Ground, MD 06/26/23 [phone]

## 2023-06-28 ENCOUNTER — Ambulatory Visit
Admit: 2023-06-28 | Payer: Medicare (Managed Care) | Attending: Student in an Organized Health Care Education/Training Program | Primary: Student in an Organized Health Care Education/Training Program

## 2023-07-03 ENCOUNTER — Ambulatory Visit
Admit: 2023-07-03 | Payer: Medicare (Managed Care) | Attending: Student in an Organized Health Care Education/Training Program | Primary: Student in an Organized Health Care Education/Training Program

## 2023-07-08 ENCOUNTER — Other Ambulatory Visit: Payer: Self-pay

## 2023-07-08 ENCOUNTER — Emergency Department
Admission: EM | Admit: 2023-07-08 | Discharge: 2023-07-09 | Disposition: A | Discharge status: Home / Self Care | Attending: Emergency Medicine | Admitting: Emergency Medicine

## 2023-07-08 ENCOUNTER — Encounter: Payer: Self-pay | Admitting: Emergency Medicine

## 2023-07-08 DIAGNOSIS — R1031 Right lower quadrant pain: Secondary | ICD-10-CM | POA: Insufficient documentation

## 2023-07-08 DIAGNOSIS — R109 Unspecified abdominal pain: Secondary | ICD-10-CM

## 2023-07-08 LAB — COMPREHENSIVE METABOLIC PANEL WITH GFR
ALT: 15 U/L (ref 0–44)
AST: 19 U/L (ref 15–41)
Albumin: 3.8 g/dL (ref 3.5–5.0)
Alkaline Phosphatase: 86 U/L (ref 38–126)
Anion gap: 9 (ref 5–15)
BUN: 23 mg/dL — ABNORMAL HIGH (ref 6–20)
CO2: 25 mmol/L (ref 22–32)
Calcium: 9 mg/dL (ref 8.9–10.3)
Chloride: 102 mmol/L (ref 98–111)
Creatinine, Ser: 1.18 mg/dL — ABNORMAL HIGH (ref 0.44–1.00)
GFR, Estimated: 57 mL/min — ABNORMAL LOW (ref >60)
Glucose, Bld: 320 mg/dL — ABNORMAL HIGH (ref 70–99)
Potassium: 3.5 mmol/L (ref 3.5–5.1)
Sodium: 136 mmol/L (ref 135–145)
Total Bilirubin: 0.9 mg/dL (ref 0.0–1.2)
Total Protein: 7.7 g/dL (ref 6.5–8.1)

## 2023-07-08 LAB — URINALYSIS, ROUTINE W REFLEX MICROSCOPIC
Bacteria, UA: NONE SEEN
Bilirubin Urine: NEGATIVE
Glucose, UA: >=500 mg/dL — AB
Hgb urine dipstick: NEGATIVE
Ketones, ur: NEGATIVE mg/dL
Leukocytes,Ua: NEGATIVE
Nitrite: NEGATIVE
Protein, ur: NEGATIVE mg/dL
Specific Gravity, Urine: 1.018 (ref 1.005–1.030)
pH: 6 (ref 5.0–8.0)

## 2023-07-08 LAB — CBC WITH DIFFERENTIAL/PLATELET
Abs Immature Granulocytes: 0.03 10*3/uL (ref 0.00–0.07)
Basophils Absolute: 0 10*3/uL (ref 0.0–0.1)
Basophils Relative: 0 %
Eosinophils Absolute: 0.2 10*3/uL (ref 0.0–0.5)
Eosinophils Relative: 3 %
HCT: 36.9 % (ref 36.0–46.0)
Hemoglobin: 12.2 g/dL (ref 12.0–15.0)
Immature Granulocytes: 0 %
Lymphocytes Relative: 36 %
Lymphs Abs: 2.9 10*3/uL (ref 0.7–4.0)
MCH: 29 pg (ref 26.0–34.0)
MCHC: 33.1 g/dL (ref 30.0–36.0)
MCV: 87.9 fL (ref 80.0–100.0)
Monocytes Absolute: 0.6 10*3/uL (ref 0.1–1.0)
Monocytes Relative: 7 %
Neutro Abs: 4.3 10*3/uL (ref 1.7–7.7)
Neutrophils Relative %: 54 %
Platelets: 231 10*3/uL (ref 150–400)
RBC: 4.2 MIL/uL (ref 3.87–5.11)
RDW: 13.2 % (ref 11.5–15.5)
WBC: 8 10*3/uL (ref 4.0–10.5)
nRBC: 0.5 % — ABNORMAL HIGH (ref 0.0–0.2)

## 2023-07-08 LAB — LIPASE, BLOOD: Lipase: 47 U/L (ref 11–51)

## 2023-07-08 MED ORDER — ONDANSETRON HCL 4 MG/2ML IJ SOLN
4.0000 mg | INTRAMUSCULAR | Status: AC
  Administered 2023-07-09: 4 mg via INTRAVENOUS
  Filled 2023-07-08: qty 2

## 2023-07-08 MED ORDER — MORPHINE SULFATE (PF) 4 MG/ML IV SOLN
4.0000 mg | Freq: Once | INTRAVENOUS | Status: AC
  Administered 2023-07-09: 4 mg via INTRAVENOUS
  Filled 2023-07-08: qty 1

## 2023-07-08 NOTE — ED Triage Notes (Signed)
  Patient comes in with RLQ abdominal pain that started around 1700 this evening.  Patient states she was doing stuff around the house and felt it get tender in RLQ and pain has progressively gotten worse.  Endorses nausea with no vomiting.  Denies any urinary symptoms but states she gets bladder infections frequently.  Took tylenol  around 1930.  Pain 9/10, aching/throbbing.

## 2023-07-08 NOTE — ED Provider Notes (Signed)
 [facility] Provider Note    Event Date/Time   First MD Initiated Contact with Patient 07/08/23 2317     (approximate)   History   Abdominal Pain   HPI Kayla Caldwell is a 48 y.o. female who presents for evaluation of right lateral abdominal pain.  She said that it started this evening around 5 PM.  She says she was not doing anything in particular and all of a sudden it started hurting.  It hurts when she moves or when she pushes on the area.  Nothing in particular makes it better.  She tried taking some medicine at home but it did not help.  She has not had kidney stones before and has no recent dysuria or hematuria.  No trauma that she is aware of.  She said that it is an aching and throbbing pain.  She has not been lifting anything heavy and cannot think of anything she would have done to pull a muscle.  No vomiting, no diarrhea, no constipation.  She denies fever, chest pain, and shortness of breath.     Physical Exam   Triage Vital Signs: ED Triage Vitals  Encounter Vitals Group     BP 07/08/23 2213 (!) 172/117     Systolic BP Percentile --      Diastolic BP Percentile --      Pulse Rate 07/08/23 2213 75     Resp 07/08/23 2213 18     Temp 07/08/23 2213 97.8 F (36.6 C)     Temp Source 07/08/23 2213 Oral     SpO2 07/08/23 2213 100 %     Weight 07/08/23 2214 117.9 kg (260 lb)     Height 07/08/23 2214 1.626 m (5\' 4" )     Head Circumference --      Peak Flow --      Pain Score 07/08/23 2214 9     Pain Loc --      Pain Education --      Exclude from Growth Chart --     Most recent vital signs: Vitals:   07/08/23 2321 07/09/23 0000  BP: (!) 174/97 (!) 160/69  Pulse: 79 70  Resp:    Temp:    SpO2: 100% 99%    General: Awake, no distress.  CV:  Good peripheral perfusion.  Resp:  Normal effort. Speaking easily and comfortably, no accessory muscle usage nor intercostal retractions.   Abd:  Obese.  Abdomen is soft.  Patient reports  pain to the palpation of the right lateral side of her abdomen.  It is easily reproducible when she moves or with palpation.  No guarding or rebound. Other:  Patient is ambulating without any apparent difficulty.   ED Results / Procedures / Treatments   Labs (all labs ordered are listed, but only abnormal results are displayed) Labs Reviewed  COMPREHENSIVE METABOLIC PANEL WITH GFR - Abnormal; Notable for the following components:      Result Value   Glucose, Bld 320 (*)    BUN 23 (*)    Creatinine, Ser 1.18 (*)    GFR, Estimated 57 (*)    All other components within normal limits  URINALYSIS, ROUTINE W REFLEX MICROSCOPIC - Abnormal; Notable for the following components:   Color, Urine YELLOW (*)    APPearance HAZY (*)    Glucose, UA >=500 (*)    All other components within normal limits  CBC WITH DIFFERENTIAL/PLATELET - Abnormal; Notable for the following components:  nRBC 0.5 (*)    All other components within normal limits  LIPASE, BLOOD     RADIOLOGY See ED course for details   PROCEDURES:  Critical Care performed: No  Procedures    IMPRESSION / MDM / ASSESSMENT AND PLAN / ED COURSE  I reviewed the triage vital signs and the nursing notes.                              Differential diagnosis includes, but is not limited to, musculoskeletal strain, renal/ureteral colic, UTI/pyelonephritis, less likely appendicitis or other acute intra-abdominal infection  Patient's presentation is most consistent with acute presentation with potential threat to life or bodily function.  Labs/studies ordered: CT renal stone study, CBC with differential, urinalysis, CMP, lipase  Interventions/Medications given:  Medications  morphine  (PF) 4 MG/ML injection 4 mg (4 mg Intravenous Given 07/09/23 0027)  ondansetron  (ZOFRAN ) injection 4 mg (4 mg Intravenous Given 07/09/23 0026)    (Note:  hospital course my include additional interventions and/or labs/studies not listed  above.)   Pain seems very skill skeletal on exam.  Vital signs are reassuring other than some hypertension.  No leukocytosis, normal CMP other than hyperglycemia and a chronically elevated creatinine.  Glucosuria but no evidence of infection or hematuria on urinalysis.  We will proceed with a CT of the abdomen pelvis renal stone protocol to evaluate for the possibility of ureteral colic, but I explained to the patient I thought it was unlikely we would find a specific reason for the pain.  I will treat her symptoms with morphine  4 g IV and Zofran  4 mg IV but anticipate if no specific findings are identified on the CT scan that she should be able to follow-up as an outpatient with OTC medications.     Clinical Course as of 07/09/23 0301  Sun Jul 09, 2023  0210 Patient ambulatory to and from the bathroom.  She said that her side still hurts.  Fortunately, I independently viewed and interpreted the patient's CT renal stone protocol, and I see no acute abnormalities to explain his symptoms.  Radiology confirmed no acute or abnormal findings.  Patient says she just wants to go and lie down.  I will discharge her for outpatient follow-up as needed and recommend continued use of over-the-counter medication.  I gave my usual and customary return precautions. [CF]    Clinical Course User Index [CF] Lynnda Sas, MD     FINAL CLINICAL IMPRESSION(S) / ED DIAGNOSES   Final diagnoses:  Right sided abdominal pain     Rx / DC Orders   ED Discharge Orders     None        Note:  This document was prepared using Dragon voice recognition software and may include unintentional dictation errors.   Lynnda Sas, MD 07/09/23 [phone]

## 2023-07-09 ENCOUNTER — Emergency Department

## 2023-07-09 DIAGNOSIS — R1031 Right lower quadrant pain: Secondary | ICD-10-CM | POA: Diagnosis not present

## 2023-07-09 NOTE — Discharge Instructions (Signed)

## 2023-07-11 ENCOUNTER — Ambulatory Visit
Admit: 2023-07-11 | Discharge: 2023-07-12 | Payer: Medicare (Managed Care) | Attending: Student in an Organized Health Care Education/Training Program | Primary: Student in an Organized Health Care Education/Training Program

## 2023-07-11 DIAGNOSIS — Z87891 Personal history of nicotine dependence: Principal | ICD-10-CM

## 2023-07-11 DIAGNOSIS — E1165 Type 2 diabetes mellitus with hyperglycemia: Principal | ICD-10-CM

## 2023-07-11 DIAGNOSIS — I5032 Chronic diastolic (congestive) heart failure: Principal | ICD-10-CM

## 2023-07-11 DIAGNOSIS — E66813 Class 3 severe obesity due to excess calories with serious comorbidity in adult, unspecified BMI: Principal | ICD-10-CM

## 2023-07-11 DIAGNOSIS — R9431 Abnormal electrocardiogram [ECG] [EKG]: Principal | ICD-10-CM

## 2023-07-11 DIAGNOSIS — Z951 Presence of aortocoronary bypass graft: Principal | ICD-10-CM

## 2023-07-11 DIAGNOSIS — Z9861 Coronary angioplasty status: Principal | ICD-10-CM

## 2023-07-11 DIAGNOSIS — I10 Essential (primary) hypertension: Principal | ICD-10-CM

## 2023-07-11 DIAGNOSIS — E7849 Other hyperlipidemia: Principal | ICD-10-CM

## 2023-07-11 DIAGNOSIS — Z09 Encounter for follow-up examination after completed treatment for conditions other than malignant neoplasm: Principal | ICD-10-CM

## 2023-07-11 MED ORDER — ASPIRIN 81 MG TABLET,DELAYED RELEASE
ORAL_TABLET | Freq: Every day | ORAL | 3 refills | 90.00000 days | Status: CP
Start: 2023-07-11 — End: 2024-07-05

## 2023-07-11 MED ORDER — CLOPIDOGREL 75 MG TABLET
ORAL_TABLET | Freq: Every day | ORAL | 3 refills | 90.00000 days | Status: CP
Start: 2023-07-11 — End: ?

## 2023-07-11 MED ORDER — LOSARTAN 50 MG TABLET
ORAL_TABLET | Freq: Every day | ORAL | 3 refills | 100.00000 days | Status: CP
Start: 2023-07-11 — End: 2024-08-14

## 2023-07-11 MED ORDER — AMLODIPINE 10 MG TABLET
ORAL_TABLET | Freq: Every day | ORAL | 3 refills | 90.00000 days | Status: CP
Start: 2023-07-11 — End: 2024-07-10

## 2023-07-11 MED ORDER — CARVEDILOL 25 MG TABLET
ORAL_TABLET | Freq: Two times a day (BID) | ORAL | 3 refills | 90.00000 days | Status: CP
Start: 2023-07-11 — End: 2024-07-05

## 2023-07-11 MED ORDER — BUMETANIDE 2 MG TABLET
ORAL_TABLET | ORAL | 3 refills | 90.00000 days | Status: CP
Start: 2023-07-11 — End: 2024-07-05

## 2023-07-11 MED ORDER — ATORVASTATIN 80 MG TABLET
ORAL_TABLET | Freq: Every day | ORAL | 3 refills | 90.00000 days | Status: CP
Start: 2023-07-11 — End: 2024-07-05

## 2023-07-11 MED ORDER — RANOLAZINE ER 500 MG TABLET,EXTENDED RELEASE,12 HR
ORAL_TABLET | Freq: Two times a day (BID) | ORAL | 3 refills | 90.00000 days | Status: CP
Start: 2023-07-11 — End: 2024-07-05

## 2023-07-12 ENCOUNTER — Encounter: Payer: Self-pay | Admitting: Emergency Medicine

## 2023-07-12 ENCOUNTER — Other Ambulatory Visit: Payer: Self-pay

## 2023-07-12 ENCOUNTER — Emergency Department
Admission: EM | Admit: 2023-07-12 | Discharge: 2023-07-12 | Disposition: A | Discharge status: Home / Self Care | Attending: Emergency Medicine | Admitting: Emergency Medicine

## 2023-07-12 DIAGNOSIS — I2510 Atherosclerotic heart disease of native coronary artery without angina pectoris: Secondary | ICD-10-CM | POA: Diagnosis not present

## 2023-07-12 DIAGNOSIS — Z951 Presence of aortocoronary bypass graft: Secondary | ICD-10-CM | POA: Diagnosis not present

## 2023-07-12 DIAGNOSIS — R55 Syncope and collapse: Secondary | ICD-10-CM | POA: Insufficient documentation

## 2023-07-12 DIAGNOSIS — I5030 Unspecified diastolic (congestive) heart failure: Secondary | ICD-10-CM | POA: Diagnosis not present

## 2023-07-12 DIAGNOSIS — E119 Type 2 diabetes mellitus without complications: Secondary | ICD-10-CM | POA: Insufficient documentation

## 2023-07-12 DIAGNOSIS — I110 Hypertensive heart disease with heart failure: Secondary | ICD-10-CM | POA: Insufficient documentation

## 2023-07-12 DIAGNOSIS — Z79899 Other long term (current) drug therapy: Secondary | ICD-10-CM | POA: Insufficient documentation

## 2023-07-12 DIAGNOSIS — Z8673 Personal history of transient ischemic attack (TIA), and cerebral infarction without residual deficits: Secondary | ICD-10-CM | POA: Insufficient documentation

## 2023-07-12 HISTORY — DX: Type 2 diabetes mellitus without complications: E11.9

## 2023-07-12 LAB — BASIC METABOLIC PANEL WITH GFR
Anion gap: 10 (ref 5–15)
BUN: 19 mg/dL (ref 6–20)
CO2: 26 mmol/L (ref 22–32)
Calcium: 9.3 mg/dL (ref 8.9–10.3)
Chloride: 104 mmol/L (ref 98–111)
Creatinine, Ser: 1.13 mg/dL — ABNORMAL HIGH (ref 0.44–1.00)
GFR, Estimated: 60 mL/min — ABNORMAL LOW (ref >60)
Glucose, Bld: 278 mg/dL — ABNORMAL HIGH (ref 70–99)
Potassium: 3.5 mmol/L (ref 3.5–5.1)
Sodium: 140 mmol/L (ref 135–145)

## 2023-07-12 LAB — CBC WITH DIFFERENTIAL/PLATELET
Abs Immature Granulocytes: 0.02 10*3/uL (ref 0.00–0.07)
Basophils Absolute: 0 10*3/uL (ref 0.0–0.1)
Basophils Relative: 1 %
Eosinophils Absolute: 0.2 10*3/uL (ref 0.0–0.5)
Eosinophils Relative: 2 %
HCT: 39.2 % (ref 36.0–46.0)
Hemoglobin: 13.3 g/dL (ref 12.0–15.0)
Immature Granulocytes: 0 %
Lymphocytes Relative: 26 %
Lymphs Abs: 1.6 10*3/uL (ref 0.7–4.0)
MCH: 29.1 pg (ref 26.0–34.0)
MCHC: 33.9 g/dL (ref 30.0–36.0)
MCV: 85.8 fL (ref 80.0–100.0)
Monocytes Absolute: 0.4 10*3/uL (ref 0.1–1.0)
Monocytes Relative: 7 %
Neutro Abs: 4 10*3/uL (ref 1.7–7.7)
Neutrophils Relative %: 64 %
Platelets: 258 10*3/uL (ref 150–400)
RBC: 4.57 MIL/uL (ref 3.87–5.11)
RDW: 13.1 % (ref 11.5–15.5)
WBC: 6.3 10*3/uL (ref 4.0–10.5)
nRBC: 0 % (ref 0.0–0.2)

## 2023-07-12 LAB — TROPONIN I (HIGH SENSITIVITY)
Troponin I (High Sensitivity): 16 ng/L (ref <18)
Troponin I (High Sensitivity): 18 ng/L — ABNORMAL HIGH (ref <18)

## 2023-07-12 MED ORDER — ONDANSETRON HCL 4 MG/2ML IJ SOLN
4.0000 mg | Freq: Once | INTRAMUSCULAR | Status: AC
  Administered 2023-07-12: 4 mg via INTRAVENOUS
  Filled 2023-07-12: qty 2

## 2023-07-12 MED ORDER — SODIUM CHLORIDE 0.9 % IV BOLUS
500.0000 mL | Freq: Once | INTRAVENOUS | Status: AC
  Administered 2023-07-12: 500 mL via INTRAVENOUS

## 2023-07-12 NOTE — ED Notes (Signed)
 CCMD monitoring pt

## 2023-07-12 NOTE — ED Provider Notes (Signed)
 [facility] Provider Note    Event Date/Time   First MD Initiated Contact with Patient 07/12/23 [id number]     (approximate)   History   Loss of Consciousness   HPI  Katharin Fulop is a 48 y.o. 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  The patient states that she felt fine when she got up this morning although was in a rush and did not eat breakfast like she normally would.  She states that she started to feel weak, lightheaded, and then got very sweaty.  She sat down and then passed out.  She did not fall or hit her head.  She did not become incontinent.  She states that she feels tired but otherwise better now.  She reports mild chest discomfort but no shortness of breath.  She denies any vomiting or diarrhea.  I reviewed the past medical records.  The patient was seen by cardiology [facility] yesterday.  She had been on amlodipine  and carvedilol  for blood pressure, and a losartan was added.  Previously, she was seen in the ED on 5/10 for abdominal pain with a negative workup.  Physical Exam   Triage Vital Signs: ED Triage Vitals  Encounter Vitals Group     BP 07/12/23 0734 137/83     Systolic BP Percentile --      Diastolic BP Percentile --      Pulse Rate 07/12/23 0734 62     Resp 07/12/23 0734 16     Temp 07/12/23 0734 98.3 F (36.8 C)     Temp Source 07/12/23 0734 Oral     SpO2 07/12/23 0734 100 %     Weight --      Height --      Head Circumference --      Peak Flow --      Pain Score 07/12/23 0733 5     Pain Loc --      Pain Education --      Exclude from Growth Chart --     Most recent vital signs: Vitals:   07/12/23 0734  BP: 137/83  Pulse: 62  Resp: 16  Temp: 98.3 F (36.8 C)  SpO2: 100%     General: Awake, no distress.  CV:  Good peripheral perfusion.  Resp:  Normal  effort.  Lungs CTAB. Abd:  No distention.  Other:  EOMI.  PERRLA.  No photophobia.  Normal speech.  No facial droop.  Motor intact in all extremities.  No ataxia on finger-to-nose.  Moist mucous membranes.  No peripheral edema.   ED Results / Procedures / Treatments   Labs (all labs ordered are listed, but only abnormal results are displayed) Labs Reviewed  BASIC METABOLIC PANEL WITH GFR - Abnormal; Notable for the following components:      Result Value   Glucose, Bld 278 (*)    Creatinine, Ser 1.13 (*)    GFR, Estimated 60 (*)    All other components within normal limits  TROPONIN I (HIGH SENSITIVITY) - Abnormal; Notable for the following components:   Troponin I (High Sensitivity) 18 (*)    All other components within normal limits  CBC WITH DIFFERENTIAL/PLATELET  TROPONIN I (HIGH SENSITIVITY)     EKG  ED ECG REPORT I, Lind Repine, the attending physician, personally viewed and interpreted this ECG.  Date: 07/12/2023 EKG Time: 0735 Rate: 66 Rhythm: normal sinus rhythm QRS Axis: normal Intervals: normal ST/T Wave abnormalities: Nonspecific ST abnormalities Narrative Interpretation: no evidence of acute ischemia    RADIOLOGY    PROCEDURES:  Critical Care performed: No  Procedures   MEDICATIONS ORDERED IN ED: Medications  sodium chloride  0.9 % bolus 500 mL (0 mLs Intravenous Stopped 07/12/23 1003)  ondansetron  (ZOFRAN ) injection 4 mg (4 mg Intravenous Given 07/12/23 0825)     IMPRESSION / MDM / ASSESSMENT AND PLAN / ED COURSE  I reviewed the triage vital signs and the nursing notes.  48 year old female with PMH as noted above presents with a syncopal episode while at work, with a prodrome of lightheadedness and diaphoresis.  On exam the patient is overall well-appearing.  She was hypertensive in the field but is normotensive here.  Differential diagnosis includes, but is not limited to, dehydration/hypovolemia, vasovagal syncope, hypotension related  to the new antihypertensive, less likely cardiac etiology.  We will obtain basic and cardiac labs, give a small fluid bolus, and reassess.  Patient's presentation is most consistent with acute presentation with potential threat to life or bodily function.  The patient is on the cardiac monitor to evaluate for evidence of arrhythmia and/or significant heart rate changes.  ----------------------------------------- 11:02 AM on 07/12/2023 -----------------------------------------  BMP shows normal creatinine and BUN.  Troponins are negative x 2.  The change from 16-18 is not clinically significant.  The patient states she is feeling better.  I did consider whether she may benefit from inpatient admission given her comorbidities for syncope workup, however given the resolved symptoms, reassuring workup, and stable vitals, I feel that she is appropriate for discharge.  The patient feels well to go home.  I counseled her on the results of the workup and plan of care.  I gave strict return precautions and she expressed understanding.   FINAL CLINICAL IMPRESSION(S) / ED DIAGNOSES   Final diagnoses:  Syncope, unspecified syncope type     Rx / DC Orders   ED Discharge Orders     None        Note:  This document was prepared using Dragon voice recognition software and may include unintentional dictation errors.    Lind Repine, MD 07/12/23 1102

## 2023-07-12 NOTE — Discharge Instructions (Signed)
 Do not take the losartan today; start taking it tomorrow morning as we discussed.  Follow-up with your regular doctor.  Return to the ER for new, worsening, or persistent severe dizziness, lightheadedness, recurrent episodes of passing out, chest pain, difficulty breathing, or any other new or worsening symptoms that concern you.

## 2023-07-12 NOTE — ED Triage Notes (Signed)
 Pt to ED via [facility] from [employer] for c/o syncopal episode. Pt sat down prior to episode. Hx DM, HTN, began losartan last night.  [facility] reports initial BP 210/153, currently 137/83

## 2023-07-13 ENCOUNTER — Ambulatory Visit
Admit: 2023-07-13 | Discharge: 2023-07-14 | Payer: Medicare (Managed Care) | Attending: Registered" | Primary: Registered"

## 2023-07-18 ENCOUNTER — Ambulatory Visit
Admit: 2023-07-18 | Discharge: 2023-07-19 | Payer: Medicare (Managed Care) | Attending: Student in an Organized Health Care Education/Training Program | Primary: Student in an Organized Health Care Education/Training Program

## 2023-07-18 DIAGNOSIS — E669 Obesity, unspecified: Principal | ICD-10-CM

## 2023-07-18 DIAGNOSIS — R079 Chest pain, unspecified: Principal | ICD-10-CM

## 2023-07-18 DIAGNOSIS — R55 Syncope and collapse: Principal | ICD-10-CM

## 2023-07-18 DIAGNOSIS — N1831 CKD stage 3a, GFR 45-59 ml/min (CMS-HCC): Principal | ICD-10-CM

## 2023-07-18 DIAGNOSIS — E119 Type 2 diabetes mellitus without complications: Principal | ICD-10-CM

## 2023-07-19 ENCOUNTER — Ambulatory Visit: Admitting: Podiatry

## 2023-07-20 ENCOUNTER — Ambulatory Visit: Admitting: Podiatry

## 2023-07-21 ENCOUNTER — Other Ambulatory Visit: Payer: Self-pay | Admitting: Podiatry

## 2023-07-21 ENCOUNTER — Telehealth: Payer: Self-pay | Admitting: Podiatry

## 2023-07-21 MED ORDER — HYDROCODONE-ACETAMINOPHEN 5-325 MG PO TABS: 1.0000 | ORAL_TABLET | Freq: Four times a day (QID) | ORAL | 0 refills | Status: AC | PRN

## 2023-07-21 NOTE — Telephone Encounter (Signed)
 Pt was No Show 5/21 [place], cancelled 5/22 [facility], resch'ed for 6/12 [facility].  She is have really bad R foot pain. Rapid release Tylenol  is not helping and she is taking alot of them. Will like to know if you can her in something for pain to:   [id number] [place], [place] - [address] AT [facility] OF [address] Phone: [phone]  Fax: [phone]

## 2023-07-21 NOTE — Telephone Encounter (Signed)
 Patient called the answering service. She states her foot is hurting to the point where she had to leave work and it hurts even in the boot when putting weight on the foot. Her BP is high. I have advised her to go to the ER due to the BP. She states it is high because of the pain. She saw her PCP and cardiologist this week. I sent her a one time dose of vicodin to the pharmacy (she is allergic to tramadol and cannot do NSAIDs/steroids). We will get her in to see another provider next week.   Shelly, can we get her in to see another provider while Dr. Lydia Sams is out? Thanks!

## 2023-07-21 NOTE — Telephone Encounter (Signed)
No answer. Left voicemail for patient to call back.

## 2023-07-24 MED ORDER — CHLORTHALIDONE 25 MG TABLET
ORAL_TABLET | ORAL | 1 refills | 0.00000 days | Status: CP
Start: 2023-07-24 — End: ?

## 2023-07-25 ENCOUNTER — Inpatient Hospital Stay: Admit: 2023-07-25 | Discharge: 2023-07-26 | Payer: Medicare (Managed Care)

## 2023-07-25 NOTE — Telephone Encounter (Signed)
Thank you     Kayla Caldwell

## 2023-07-26 MED ORDER — FLASH GLUCOSE SENSOR KIT
0 refills | 0.00000 days
Start: 2023-07-26 — End: ?

## 2023-07-27 ENCOUNTER — Ambulatory Visit (INDEPENDENT_AMBULATORY_CARE_PROVIDER_SITE_OTHER): Admitting: Podiatry

## 2023-07-27 ENCOUNTER — Encounter: Payer: Self-pay | Admitting: Podiatry

## 2023-07-27 DIAGNOSIS — M722 Plantar fascial fibromatosis: Secondary | ICD-10-CM | POA: Diagnosis not present

## 2023-07-27 DIAGNOSIS — B353 Tinea pedis: Secondary | ICD-10-CM | POA: Diagnosis not present

## 2023-07-27 MED ORDER — KETOCONAZOLE 2 % EX CREA: 1.0000 | TOPICAL_CREAM | Freq: Every day | CUTANEOUS | 0 refills | Status: AC

## 2023-07-27 MED ORDER — DEXCOM G7 SENSOR DEVICE
1 refills | 0.00000 days | Status: CP
Start: 2023-07-27 — End: ?

## 2023-07-27 MED ORDER — BLOOD-GLUCOSE METER,RECEIVER,CONTINUOUS
Freq: Two times a day (BID) | 1 refills | 0.00000 days | Status: CP
Start: 2023-07-27 — End: ?

## 2023-07-27 NOTE — Progress Notes (Signed)
 MyChart patient presents with the follow-up for plantar fasciitis.  She has been seeing Dr. Lydia Sams.  Wanted to be seen sooner.  She continues at the same level of pain.  She been wearing pneumatic cast and doing at home exercises.  New complaint today of itchy rash on the plantar aspect of the foot right.  Has been bothering her for 2 or 3 weeks.   Physical exam:  General appearance: Pleasant, and in no acute distress. AOx3.  Vascular: Pedal pulses: DP 2/4, PT 2/4.  Mild edema lower legs bilaterally. Capillary fill time immediate.  Neurological: Light touch intact feet bilaterally.  Normal Achilles reflex bilaterally.   Negative Tinel's sign at tarsal tunnel, porta pedis, sural nerve right.  This  Dermatologic:   Skin normal temperature bilaterally.  Scaly erythematous rash in a moccasin distribution plantar right foot.  Musculoskeletal: Tenderness plantar heel at plantar Medial calcaneal tubercle and lateral calcaneal tubercle.  No tenderness with lateral compression of calcaneus.  No tenderness at the insertion of the Achilles or along the Achilles tendon right.  Radiographs: None  Diagnosis: 1.  Plantar fasciitis right. 2.  Tinea pedis right foot.  Plan: -Established office visit for evaluation and management level 3. - Discussed with her continued pain physical plantar fasciitis on the right.  Based on Dr. Basilio Both last notes she is already exhausted her injections with no improvement.  She has been wearing a pneumatic cast.  She has been doing the home exercises.  Her options at this point are probably physical therapy or surgical intervention.  Patient would like to pursue physical therapy at this time. -Ordered physical therapy evaluate and treat.  Diagnosis plantar fasciitis. -Rx ketoconazole cream apply to affected area foot twice daily refills x 9  Return return 4 weeks for follow-up plantar fasciitis with Dr. Lydia Sams

## 2023-07-27 NOTE — Addendum Note (Signed)
 Addended by: Reche Canales on: 07/27/2023 05:04 PM   Modules accepted: Orders

## 2023-07-28 ENCOUNTER — Encounter: Payer: Self-pay | Admitting: Podiatry

## 2023-07-28 ENCOUNTER — Telehealth: Payer: Self-pay | Admitting: Podiatry

## 2023-07-28 NOTE — Telephone Encounter (Signed)
 Patient is requesting to pick up note for work, she is on her way to pick it up at front desk.

## 2023-07-31 ENCOUNTER — Ambulatory Visit
Admit: 2023-07-31 | Discharge: 2023-08-01 | Payer: Medicare (Managed Care) | Attending: Student in an Organized Health Care Education/Training Program | Primary: Student in an Organized Health Care Education/Training Program

## 2023-07-31 DIAGNOSIS — Z951 Presence of aortocoronary bypass graft: Principal | ICD-10-CM

## 2023-07-31 DIAGNOSIS — E7849 Other hyperlipidemia: Principal | ICD-10-CM

## 2023-07-31 DIAGNOSIS — E1159 Type 2 diabetes mellitus with other circulatory complications: Principal | ICD-10-CM

## 2023-07-31 DIAGNOSIS — Z87891 Personal history of nicotine dependence: Principal | ICD-10-CM

## 2023-07-31 DIAGNOSIS — I5032 Chronic diastolic (congestive) heart failure: Principal | ICD-10-CM

## 2023-07-31 DIAGNOSIS — I10 Essential (primary) hypertension: Principal | ICD-10-CM

## 2023-07-31 DIAGNOSIS — R9431 Abnormal electrocardiogram [ECG] [EKG]: Principal | ICD-10-CM

## 2023-07-31 DIAGNOSIS — Z09 Encounter for follow-up examination after completed treatment for conditions other than malignant neoplasm: Principal | ICD-10-CM

## 2023-07-31 MED ORDER — INSULIN GLARGINE (U-100) 100 UNIT/ML (3 ML) SUBCUTANEOUS PEN
Freq: Every evening | SUBCUTANEOUS | 11 refills | 200.00000 days | Status: CP
Start: 2023-07-31 — End: 2024-01-27

## 2023-07-31 MED ORDER — EMPAGLIFLOZIN 10 MG TABLET
ORAL_TABLET | Freq: Every day | ORAL | 3 refills | 30.00000 days | Status: CP
Start: 2023-07-31 — End: 2024-07-30

## 2023-07-31 MED ORDER — FREESTYLE LIBRE 3 PLUS SENSOR DEVICE
ORAL | 11 refills | 0.00000 days | Status: CP
Start: 2023-07-31 — End: ?

## 2023-08-10 ENCOUNTER — Ambulatory Visit: Admitting: Podiatry

## 2023-08-15 ENCOUNTER — Ambulatory Visit: Admit: 2023-08-15 | Payer: Medicare (Managed Care)

## 2023-08-21 DIAGNOSIS — Z951 Presence of aortocoronary bypass graft: Principal | ICD-10-CM

## 2023-08-21 DIAGNOSIS — I5032 Chronic diastolic (congestive) heart failure: Principal | ICD-10-CM

## 2023-08-21 DIAGNOSIS — Z9861 Coronary angioplasty status: Principal | ICD-10-CM

## 2023-08-21 DIAGNOSIS — E7849 Other hyperlipidemia: Principal | ICD-10-CM

## 2023-08-21 DIAGNOSIS — R9431 Abnormal electrocardiogram [ECG] [EKG]: Principal | ICD-10-CM

## 2023-08-21 DIAGNOSIS — I10 Essential (primary) hypertension: Principal | ICD-10-CM

## 2023-08-21 DIAGNOSIS — E1165 Type 2 diabetes mellitus with hyperglycemia: Principal | ICD-10-CM

## 2023-08-21 DIAGNOSIS — E66813 Class 3 severe obesity due to excess calories with serious comorbidity in adult, unspecified BMI: Principal | ICD-10-CM

## 2023-08-23 MED ORDER — OZEMPIC 0.25 MG OR 0.5 MG (2 MG/3 ML) SUBCUTANEOUS PEN INJECTOR
0.00000 days
Start: 2023-08-23 — End: ?

## 2023-08-24 MED ORDER — OZEMPIC 0.25 MG OR 0.5 MG (2 MG/3 ML) SUBCUTANEOUS PEN INJECTOR
SUBCUTANEOUS | 11 refills | 0.00000 days | Status: CP
Start: 2023-08-24 — End: ?

## 2023-08-28 DIAGNOSIS — B379 Candidiasis, unspecified: Principal | ICD-10-CM

## 2023-08-28 MED ORDER — FLUCONAZOLE 150 MG TABLET
ORAL_TABLET | ORAL | 0 refills | 0.00000 days | Status: CP
Start: 2023-08-28 — End: ?

## 2023-08-29 ENCOUNTER — Ambulatory Visit: Admit: 2023-08-29 | Payer: Medicare (Managed Care)

## 2023-09-06 ENCOUNTER — Emergency Department
Admission: EM | Admit: 2023-09-06 | Discharge: 2023-09-06 | Discharge status: Against Medical Advice | Attending: Emergency Medicine | Admitting: Emergency Medicine

## 2023-09-06 ENCOUNTER — Emergency Department

## 2023-09-06 ENCOUNTER — Other Ambulatory Visit: Payer: Self-pay

## 2023-09-06 DIAGNOSIS — R0789 Other chest pain: Secondary | ICD-10-CM | POA: Diagnosis present

## 2023-09-06 DIAGNOSIS — R110 Nausea: Secondary | ICD-10-CM | POA: Insufficient documentation

## 2023-09-06 DIAGNOSIS — Z5321 Procedure and treatment not carried out due to patient leaving prior to being seen by health care provider: Secondary | ICD-10-CM | POA: Insufficient documentation

## 2023-09-06 LAB — COMPREHENSIVE METABOLIC PANEL WITH GFR
ALT: 12 U/L (ref 0–44)
AST: 18 U/L (ref 15–41)
Albumin: 4.1 g/dL (ref 3.5–5.0)
Alkaline Phosphatase: 84 U/L (ref 38–126)
Anion gap: 10 (ref 5–15)
BUN: 19 mg/dL (ref 6–20)
CO2: 22 mmol/L (ref 22–32)
Calcium: 9 mg/dL (ref 8.9–10.3)
Chloride: 108 mmol/L (ref 98–111)
Creatinine, Ser: 0.89 mg/dL (ref 0.44–1.00)
GFR, Estimated: >60 mL/min (ref >60)
Glucose, Bld: 110 mg/dL — ABNORMAL HIGH (ref 70–99)
Potassium: 3.4 mmol/L — ABNORMAL LOW (ref 3.5–5.1)
Sodium: 140 mmol/L (ref 135–145)
Total Bilirubin: 0.8 mg/dL (ref 0.0–1.2)
Total Protein: 7.7 g/dL (ref 6.5–8.1)

## 2023-09-06 LAB — CBC
HCT: 33.6 % — ABNORMAL LOW (ref 36.0–46.0)
Hemoglobin: 11.4 g/dL — ABNORMAL LOW (ref 12.0–15.0)
MCH: 29.5 pg (ref 26.0–34.0)
MCHC: 33.9 g/dL (ref 30.0–36.0)
MCV: 86.8 fL (ref 80.0–100.0)
Platelets: 231 K/uL (ref 150–400)
RBC: 3.87 MIL/uL (ref 3.87–5.11)
RDW: 13.8 % (ref 11.5–15.5)
WBC: 7.6 K/uL (ref 4.0–10.5)
nRBC: 0 % (ref 0.0–0.2)

## 2023-09-06 LAB — LIPASE, BLOOD: Lipase: 29 U/L (ref 11–51)

## 2023-09-06 LAB — TROPONIN I (HIGH SENSITIVITY): Troponin I (High Sensitivity): 16 ng/L (ref <18)

## 2023-09-06 NOTE — ED Triage Notes (Signed)
 Pt presents via POV c/o left sided chest pain. Reports pain initially started in her abd but has now moved into the left side of her chest. Pt also endorses nausea.   Pt reports on Oxempic

## 2023-09-06 NOTE — ED Notes (Signed)
 Pt not seen when called for lab redraw

## 2023-09-20 ENCOUNTER — Ambulatory Visit
Admission: EM | Admit: 2023-09-20 | Discharge: 2023-09-20 | Disposition: A | Discharge status: Home / Self Care | Attending: Physician Assistant | Admitting: Physician Assistant

## 2023-09-20 ENCOUNTER — Encounter: Payer: Self-pay | Admitting: Emergency Medicine

## 2023-09-20 DIAGNOSIS — L299 Pruritus, unspecified: Secondary | ICD-10-CM

## 2023-09-20 MED ORDER — HYDROXYZINE HCL 25 MG PO TABS: 25.0000 mg | ORAL_TABLET | Freq: Four times a day (QID) | ORAL | 0 refills | Status: AC | PRN

## 2023-09-20 MED ORDER — BUMETANIDE 2 MG TABLET
ORAL_TABLET | ORAL | 3 refills | 90.00000 days | Status: CP
Start: 2023-09-20 — End: 2024-09-14

## 2023-09-20 MED ORDER — HYDROXYZINE HCL 25 MG TABLET
ORAL_TABLET | Freq: Three times a day (TID) | ORAL | 0 refills | 90.00000 days | Status: CP | PRN
Start: 2023-09-20 — End: 2023-12-19

## 2023-09-20 NOTE — Discharge Instructions (Signed)
-  Take hydroxyzine  as needed for body itching -Continue to follow up with PCP  -Go to ER for swelling, difficulty breathing, weakness

## 2023-09-20 NOTE — ED Provider Notes (Signed)
 [facility]    CSN: [id number] Arrival date & time: 09/20/23  1931      History   Chief Complaint Chief Complaint  Patient presents with   Urticaria    HPI Kayla Caldwell is a 48 y.o. female presenting for 4-day history of diffuse full body itching including itching of her scalp.  Patient denies rash but says it feels like there are hives all over her body.  No changes to medications, body washes, lotions, detergents.  Patient reports a lot of different allergies and says sometimes she will just get random itching and not know the cause.  She says if she is bitten or stung by an insect she will often develop full body itching.  She has tried Benadryl at home and it has not helped.  She reached out to her PCP for refill of hydroxyzine  5 days ago but she has not heard back from meds.  She says hydroxyzine  is really the only thing helps when her symptoms are this bad.  No associated facial swelling, difficulty swallowing or shortness of breath.  HPI  Past Medical History:  Diagnosis Date   CHF (congestive heart failure) (HCC)    COPD (chronic obstructive pulmonary disease) (HCC)    Diabetes (HCC)    Diabetes mellitus without complication (HCC)    Heart disease    Heart failure (HCC)    Hyperlipidemia    Hypertension    MI (myocardial infarction) [facility])     Patient Active Problem List   Diagnosis Date Noted   Left shoulder pain 06/12/2021   History of CVA (cerebrovascular accident) 06/11/2021   Hypertensive urgency 06/11/2021   AKI (acute kidney injury) (HCC) 06/11/2021   Unstable angina (HCC) 06/11/2021   Chest pain 04/11/2021   HLD (hyperlipidemia) 04/11/2021   Diabetes mellitus without complication (HCC) 04/11/2021   COPD (chronic obstructive pulmonary disease) (HCC) 04/11/2021   CAD (coronary artery disease) 04/11/2021   Hypokalemia 04/11/2021   Obesity, Class III, BMI 40-49.9 (morbid obesity) 04/11/2021   Chronic diastolic CHF (congestive heart  failure) (HCC) 04/11/2021   Positive D-dimer    Class 3 severe obesity due to excess calories without serious comorbidity with body mass index (BMI) of 40.0 to 44.9 in adult 10/27/2020   Atelectasis, left    Lobar pneumonia (HCC) 10/11/2020   CAD (coronary artery disease), native coronary artery 10/11/2020   CAD s/p  CABG x4 07/2020 10/11/2020   Pneumonia 10/11/2020   Diabetes (HCC)    Hypertension     Past Surgical History:  Procedure Laterality Date   ABDOMINAL HYSTERECTOMY     CARDIAC SURGERY     CORONARY ARTERY BYPASS GRAFT      OB History   No obstetric history on file.      Home Medications    Prior to Admission medications   Medication Sig Start Date End Date Taking? Authorizing Provider  hydrOXYzine  (ATARAX ) 25 MG tablet Take 1 tablet (25 mg total) by mouth every 6 (six) hours as needed for itching. 09/20/23  Yes Arvis Jolan NOVAK, PA-C  albuterol  (VENTOLIN  HFA) 108 (90 Base) MCG/ACT inhaler Inhale 2 puffs into the lungs every 6 (six) hours as needed for wheezing or shortness of breath. 03/19/21   Ernest Ronal BRAVO, MD  amLODipine  (NORVASC ) 10 MG tablet Take 1 tablet (10 mg total) by mouth daily. 10/27/20   Britta King, MD  aspirin  EC 81 MG EC tablet Take 1 tablet (81 mg total) by mouth daily. Swallow whole. 04/13/21  Patel, Sona, MD  atorvastatin  (LIPITOR ) 80 MG tablet Take 1 tablet (80 mg total) by mouth daily. 10/27/20   Masoud, Javed, MD  Bacillus Coagulans-Inulin (PROBIOTIC) 1-250 BILLION-MG CAPS Take 1 capsule by mouth daily. 09/28/21   Viviann Pastor, MD  bumetanide  (BUMEX ) 2 MG tablet Take 2 mg by mouth daily.    [provider]  carvedilol  (COREG ) 25 MG tablet Take 1 tablet (25 mg total) by mouth 2 (two) times daily with a meal. 10/27/20   Britta King, MD  chlorthalidone (HYGROTON) 25 MG tablet Take 25 mg by mouth daily.    [provider]  clopidogrel  (PLAVIX ) 75 MG tablet Take 75 mg by mouth daily.    [provider]   clotrimazole -betamethasone  (LOTRISONE ) cream Apply 1 Application topically daily. 05/02/23   Tobie Franky SQUIBB, DPM  Continuous Blood Gluc Sensor (FREESTYLE LIBRE 14 DAY SENSOR) MISC 1 each by Does not apply route every 14 (fourteen) days. 11/30/20   Britta King, MD  cyclobenzaprine  (FLEXERIL ) 5 MG tablet Take 1-2 tablets (5-10 mg total) by mouth 3 (three) times daily as needed for muscle spasms. 12/23/22   Charlene Debby BROCKS, PA-C  ezetimibe  (ZETIA ) 10 MG tablet Take 1 tablet (10 mg total) by mouth daily. 10/27/20   Britta King, MD  HYDROcodone -acetaminophen  (NORCO/VICODIN) 5-325 MG tablet Take 1 tablet by mouth every 6 (six) hours as needed. 07/21/23   Gershon Donnice SAUNDERS, DPM  insulin  glargine (LANTUS ) 100 UNIT/ML injection Inject 5 Units into the skin at bedtime.    [provider]  insulin  lispro (HUMALOG) 100 UNIT/ML injection Inject 2 Units into the skin 3 (three) times daily before meals.    [provider]  ketoconazole  (NIZORAL ) 2 % cream Apply 1 Application topically daily. 07/27/23   Christine Rush, DPM  lidocaine  (LIDODERM ) 5 % Place 1 patch onto the skin daily. Remove & Discard patch within 12 hours or as directed by MD 06/12/21   Awanda City, MD  magnesium  oxide (MAG-OX) 400 MG tablet Take 400 mg by mouth daily.    [provider]  methocarbamol  (ROBAXIN ) 500 MG tablet Take 1 tablet (500 mg total) by mouth every 8 (eight) hours as needed for muscle spasms. 10/19/22   Arlander Charleston, MD  ondansetron  (ZOFRAN -ODT) 4 MG disintegrating tablet Take 1 tablet (4 mg total) by mouth every 8 (eight) hours as needed for nausea or vomiting. 11/03/22   Paduchowski, Kevin, MD  pantoprazole  (PROTONIX ) 40 MG tablet Take 1 tablet (40 mg total) by mouth 2 (two) times daily as needed. Home med. 06/12/21   Awanda City, MD  potassium chloride  SA (KLOR-CON  M) 20 MEQ tablet TAKE 1 TABLET BY MOUTH DAILY 03/12/21   Britta King, MD  ranolazine  (RANEXA ) 1000 MG SR tablet Take 1 tablet (1,000 mg total) by  mouth 2 (two) times daily. 10/27/20   Masoud, Javed, MD  senna (SENOKOT) 8.6 MG TABS tablet Take 1 tablet (8.6 mg total) by mouth at bedtime as needed. 06/12/21   Awanda City, MD    Family History Family History  Problem Relation Age of Onset   Hypertension Mother    Heart disease Mother    Depression Father    Hypertension Father    Heart disease Father    Breast cancer Maternal Aunt    Cancer Maternal Aunt     Social History Social History   Tobacco Use   Smoking status: Never    Passive exposure: Never   Smokeless tobacco: Never  Vaping Use  Vaping status: Never Used  Substance Use Topics   Alcohol use: Never   Drug use: Never     Allergies   Losartan, Pantoprazole , Empagliflozin, Compazine [prochlorperazine], Hydralazine , Lisinopril, Metoclopramide, Naproxen, Nitroglycerin , Nsaids, Tape, Toradol [ketorolac tromethamine], Tramadol, Duloxetine, Wound dressing adhesive, and Wound dressings   Review of Systems Review of Systems  Constitutional:  Negative for fatigue.  HENT:  Negative for facial swelling.   Respiratory:  Negative for chest tightness and shortness of breath.   Skin:  Negative for rash.       Intense full-body itching.  Neurological:  Negative for weakness.  Psychiatric/Behavioral:  Positive for sleep disturbance.      Physical Exam Triage Vital Signs ED Triage Vitals  Encounter Vitals Group     BP 09/20/23 1944 (!) 162/96     Girls Systolic BP Percentile --      Girls Diastolic BP Percentile --      Boys Systolic BP Percentile --      Boys Diastolic BP Percentile --      Pulse Rate 09/20/23 1944 80     Resp 09/20/23 1944 18     Temp 09/20/23 1944 97.6 F (36.4 C)     Temp Source 09/20/23 1944 Oral     SpO2 09/20/23 1944 100 %     Weight 09/20/23 1943 240 lb 1.3 oz (108.9 kg)     Height --      Head Circumference --      Peak Flow --      Pain Score 09/20/23 1941 8     Pain Loc --      Pain Education --      Exclude from Growth Chart --     No data found.  Updated Vital Signs BP (!) 162/96 (BP Location: Left Arm)   Pulse 80   Temp 97.6 F (36.4 C) (Oral)   Resp 18   Wt 240 lb 1.3 oz (108.9 kg)   SpO2 100%   BMI 41.21 kg/m    Physical Exam Vitals and nursing note reviewed.  Constitutional:      General: She is not in acute distress.    Appearance: Normal appearance. She is not ill-appearing or toxic-appearing.     Comments: Patient is vigorously scratching all over her body.  HENT:     Head: Normocephalic and atraumatic.     Nose: Nose normal.     Mouth/Throat:     Mouth: Mucous membranes are moist.     Pharynx: Oropharynx is clear.  Eyes:     General: No scleral icterus.       Right eye: No discharge.        Left eye: No discharge.     Conjunctiva/sclera: Conjunctivae normal.  Cardiovascular:     Rate and Rhythm: Normal rate and regular rhythm.     Heart sounds: Normal heart sounds.  Pulmonary:     Effort: Pulmonary effort is normal. No respiratory distress.     Breath sounds: Normal breath sounds.  Musculoskeletal:     Cervical back: Neck supple.  Skin:    General: Skin is dry.     Findings: No rash.  Neurological:     General: No focal deficit present.     Mental Status: She is alert. Mental status is at baseline.     Motor: No weakness.     Gait: Gait normal.  Psychiatric:        Mood and Affect: Mood normal.  Behavior: Behavior normal.      UC Treatments / Results  Labs (all labs ordered are listed, but only abnormal results are displayed) Labs Reviewed - No data to display  EKG   Radiology No results found.  Procedures Procedures (including critical care time)  Medications Ordered in UC Medications - No data to display  Initial Impression / Assessment and Plan / UC Course  I have reviewed the triage vital signs and the nursing notes.  Pertinent labs & imaging results that were available during my care of the patient were reviewed by me and considered in my medical  decision making (see chart for details).   48 year old female presents for diffuse bodily itching for the past several days.  Unknown trigger.  This is not the first time she has had full body itching.  She denies associated rash but feels like she.  No swelling or shortness of breath.  The patient is vigorously scratching her body and scalp.  No appreciable swelling or rashes.  No respiratory distress.  Pruritus with unknown trigger.  Refilled her hydroxyzine  and encouraged to follow-up with PCP.  Advised ER for swelling, difficulty breathing, weakness or actual hives.   Final Clinical Impressions(s) / UC Diagnoses   Final diagnoses:  Pruritic condition     Discharge Instructions      -Take hydroxyzine  as needed for body itching -Continue to follow up with PCP  -Go to ER for swelling, difficulty breathing, weakness     ED Prescriptions     Medication Sig Dispense Auth. Provider   hydrOXYzine  (ATARAX ) 25 MG tablet Take 1 tablet (25 mg total) by mouth every 6 (six) hours as needed for itching. 45 tablet Arvis Jolan KATHEE DEVONNA      PDMP not reviewed this encounter.   Arvis Jolan KATHEE, PA-C 09/20/23 2002

## 2023-09-20 NOTE — ED Triage Notes (Addendum)
 Pt presents c/o hives all over her body including her scalp x 4 days. Pt denies any contact with known allergens or any new products. Pt took 2 benadryl around 2 pm but the sxs have not improved.

## 2023-10-02 DIAGNOSIS — E119 Type 2 diabetes mellitus without complications: Principal | ICD-10-CM

## 2023-10-02 MED ORDER — INSULIN GLARGINE (U-100) 100 UNIT/ML (3 ML) SUBCUTANEOUS PEN
Freq: Every evening | SUBCUTANEOUS | 11 refills | 0.00000 days
Start: 2023-10-02 — End: 2024-03-30

## 2023-10-02 MED ORDER — EZETIMIBE 10 MG TABLET
ORAL_TABLET | Freq: Every day | ORAL | 3 refills | 90.00000 days | Status: CP
Start: 2023-10-02 — End: 2024-09-26

## 2023-10-09 ENCOUNTER — Encounter: Payer: Self-pay | Admitting: *Deleted

## 2023-10-09 ENCOUNTER — Emergency Department

## 2023-10-09 ENCOUNTER — Other Ambulatory Visit: Payer: Self-pay

## 2023-10-09 ENCOUNTER — Observation Stay
Admission: EM | Admit: 2023-10-09 | Discharge: 2023-10-10 | Disposition: A | Discharge status: Home / Self Care | Attending: Family Medicine | Admitting: Family Medicine

## 2023-10-09 DIAGNOSIS — R55 Syncope and collapse: Secondary | ICD-10-CM | POA: Diagnosis present

## 2023-10-09 DIAGNOSIS — Z951 Presence of aortocoronary bypass graft: Secondary | ICD-10-CM

## 2023-10-09 DIAGNOSIS — R109 Unspecified abdominal pain: Secondary | ICD-10-CM | POA: Diagnosis present

## 2023-10-09 DIAGNOSIS — Z955 Presence of coronary angioplasty implant and graft: Secondary | ICD-10-CM | POA: Diagnosis not present

## 2023-10-09 DIAGNOSIS — J449 Chronic obstructive pulmonary disease, unspecified: Secondary | ICD-10-CM | POA: Diagnosis not present

## 2023-10-09 DIAGNOSIS — I4581 Long QT syndrome: Secondary | ICD-10-CM | POA: Diagnosis not present

## 2023-10-09 DIAGNOSIS — E66813 Obesity, class 3: Secondary | ICD-10-CM | POA: Diagnosis present

## 2023-10-09 DIAGNOSIS — Z7982 Long term (current) use of aspirin: Secondary | ICD-10-CM | POA: Diagnosis not present

## 2023-10-09 DIAGNOSIS — I10 Essential (primary) hypertension: Secondary | ICD-10-CM | POA: Diagnosis present

## 2023-10-09 DIAGNOSIS — E876 Hypokalemia: Secondary | ICD-10-CM | POA: Diagnosis not present

## 2023-10-09 DIAGNOSIS — E785 Hyperlipidemia, unspecified: Secondary | ICD-10-CM | POA: Diagnosis present

## 2023-10-09 DIAGNOSIS — Z6841 Body Mass Index (BMI) 40.0 and over, adult: Secondary | ICD-10-CM | POA: Diagnosis not present

## 2023-10-09 DIAGNOSIS — I5032 Chronic diastolic (congestive) heart failure: Secondary | ICD-10-CM | POA: Diagnosis not present

## 2023-10-09 DIAGNOSIS — I110 Hypertensive heart disease with heart failure: Secondary | ICD-10-CM | POA: Insufficient documentation

## 2023-10-09 DIAGNOSIS — R9431 Abnormal electrocardiogram [ECG] [EKG]: Secondary | ICD-10-CM | POA: Diagnosis present

## 2023-10-09 DIAGNOSIS — N179 Acute kidney failure, unspecified: Secondary | ICD-10-CM | POA: Diagnosis not present

## 2023-10-09 DIAGNOSIS — R079 Chest pain, unspecified: Secondary | ICD-10-CM | POA: Diagnosis present

## 2023-10-09 DIAGNOSIS — I2510 Atherosclerotic heart disease of native coronary artery without angina pectoris: Secondary | ICD-10-CM | POA: Diagnosis not present

## 2023-10-09 DIAGNOSIS — Z8673 Personal history of transient ischemic attack (TIA), and cerebral infarction without residual deficits: Secondary | ICD-10-CM

## 2023-10-09 LAB — CBC
HCT: 37.1 % (ref 36.0–46.0)
Hemoglobin: 12.8 g/dL (ref 12.0–15.0)
MCH: 29.2 pg (ref 26.0–34.0)
MCHC: 34.5 g/dL (ref 30.0–36.0)
MCV: 84.5 fL (ref 80.0–100.0)
Platelets: 287 K/uL (ref 150–400)
RBC: 4.39 MIL/uL (ref 3.87–5.11)
RDW: 13.7 % (ref 11.5–15.5)
WBC: 7.5 K/uL (ref 4.0–10.5)
nRBC: 0 % (ref 0.0–0.2)

## 2023-10-09 LAB — BASIC METABOLIC PANEL WITH GFR
Anion gap: 11 (ref 5–15)
BUN: 23 mg/dL — ABNORMAL HIGH (ref 6–20)
CO2: 26 mmol/L (ref 22–32)
Calcium: 9.7 mg/dL (ref 8.9–10.3)
Chloride: 100 mmol/L (ref 98–111)
Creatinine, Ser: 1.92 mg/dL — ABNORMAL HIGH (ref 0.44–1.00)
GFR, Estimated: 31 mL/min — ABNORMAL LOW (ref >60)
Glucose, Bld: 179 mg/dL — ABNORMAL HIGH (ref 70–99)
Potassium: 3.1 mmol/L — ABNORMAL LOW (ref 3.5–5.1)
Sodium: 137 mmol/L (ref 135–145)

## 2023-10-09 LAB — HEPATIC FUNCTION PANEL
ALT: 12 U/L (ref 0–44)
AST: 19 U/L (ref 15–41)
Albumin: 4.1 g/dL (ref 3.5–5.0)
Alkaline Phosphatase: 81 U/L (ref 38–126)
Bilirubin, Direct: <0.1 mg/dL (ref 0.0–0.2)
Total Bilirubin: 0.9 mg/dL (ref 0.0–1.2)
Total Protein: 8.1 g/dL (ref 6.5–8.1)

## 2023-10-09 LAB — LIPASE, BLOOD: Lipase: 30 U/L (ref 11–51)

## 2023-10-09 LAB — TROPONIN I (HIGH SENSITIVITY)
Troponin I (High Sensitivity): 29 ng/L — ABNORMAL HIGH (ref <18)
Troponin I (High Sensitivity): 23 ng/L — ABNORMAL HIGH (ref <18)

## 2023-10-09 LAB — CBG MONITORING, ED: Glucose-Capillary: 150 mg/dL — ABNORMAL HIGH (ref 70–99)

## 2023-10-09 LAB — PROTIME-INR
INR: 1 (ref 0.8–1.2)
Prothrombin Time: 13.8 s (ref 11.4–15.2)

## 2023-10-09 MED ORDER — IOHEXOL 350 MG/ML SOLN
80.0000 mL | Freq: Once | INTRAVENOUS | Status: AC | PRN
  Administered 2023-10-09 (×2): 80 mL via INTRAVENOUS

## 2023-10-09 MED ORDER — SODIUM CHLORIDE 0.9 % IV BOLUS
500.0000 mL | Freq: Once | INTRAVENOUS | Status: AC
  Administered 2023-10-09 (×2): 500 mL via INTRAVENOUS

## 2023-10-09 MED ORDER — ONDANSETRON HCL 4 MG/2ML IJ SOLN
4.0000 mg | Freq: Once | INTRAMUSCULAR | Status: AC
  Administered 2023-10-09 (×2): 4 mg via INTRAVENOUS
  Filled 2023-10-09: qty 2

## 2023-10-09 MED ORDER — SODIUM CHLORIDE 0.9 % IV SOLN: 12.5000 mg | Freq: Four times a day (QID) | INTRAVENOUS | Status: DC | PRN

## 2023-10-09 MED ORDER — HYDROMORPHONE HCL 1 MG/ML IJ SOLN
0.5000 mg | Freq: Once | INTRAMUSCULAR | Status: AC
  Administered 2023-10-09 (×2): 0.5 mg via INTRAVENOUS
  Filled 2023-10-09: qty 0.5

## 2023-10-09 MED ORDER — MIDAZOLAM HCL 2 MG/2ML IJ SOLN
0.5000 mg | Freq: Once | INTRAMUSCULAR | Status: AC
  Administered 2023-10-09 (×2): 0.5 mg via INTRAVENOUS
  Filled 2023-10-09: qty 2

## 2023-10-09 NOTE — ED Triage Notes (Signed)
 Pt ambulatory to triage.  Pt has chest pain since 1100am today.  Pt reports nausea.  Pt also has sob.  Pt reports pain in center of chest.  Cabg 3 years ago.  Pt alert  speech clear.

## 2023-10-09 NOTE — ED Notes (Signed)
 Patient in wheelchair on way to radiology from ED waiting room and was talking with me when she suddenly dropped her phone and head tilted to the side. Rapid response called by radiology staff. Patient was unresponsive to chest rub and seemed to be having a syncopal episode for about 30 seconds. Pulse and respirations still present. Patient vomited several times immediately after syncopal episode. Patient taken to room 2 in the ED by radiology staff and charge ED RN.

## 2023-10-09 NOTE — ED Provider Notes (Addendum)
 [facility] Provider Note    Event Date/Time   First MD Initiated Contact with Patient 10/09/23 2126     (approximate)   History   Chest Pain   HPI  Kayla Caldwell is a 48 y.o. female with history of diabetes who comes in with chest pain.  Patient reportedly had chest pain that started around 11 AM today.  Did report some nausea.  Also some shortness of breath.  Patient does have a history of a CABG 3 years ago.  She reports that she remembers being wheelchair due to the chest x-ray when she felt lightheaded and then had a witnessed syncopal episode.  She did not fall the chair did not hit her head.  She does report being on Plavix  but denies any headaches.  Shoulder reports some chest pain as well as some abdominal pain.  She does report that she is on an SGL 2 and that she has had some abdominal pains when taking this but she has not taken it for a week so she is not sure if it could be related to that.  She denies any history of blood clots, shortness of breath.   Physical Exam   Triage Vital Signs: ED Triage Vitals  Encounter Vitals Group     BP 10/09/23 2047 101/60     Girls Systolic BP Percentile --      Girls Diastolic BP Percentile --      Boys Systolic BP Percentile --      Boys Diastolic BP Percentile --      Pulse Rate 10/09/23 2047 70     Resp 10/09/23 2047 18     Temp 10/09/23 2047 98.1 F (36.7 C)     Temp Source 10/09/23 2047 Oral     SpO2 10/09/23 2047 99 %     Weight 10/09/23 2043 240 lb (108.9 kg)     Height 10/09/23 2043 5' 3 (1.6 m)     Head Circumference --      Peak Flow --      Pain Score 10/09/23 2043 8     Pain Loc --      Pain Education --      Exclude from Growth Chart --     Most recent vital signs: Vitals:   10/09/23 2047  BP: 101/60  Pulse: 70  Resp: 18  Temp: 98.1 F (36.7 C)  SpO2: 99%     General: Awake, no distress.  CV:  Good peripheral perfusion.  Resp:  Normal effort.  Abd:  No  distention.  Slight tenderness Other:  No swelling in legs.  No calf tenderness   ED Results / Procedures / Treatments   Labs (all labs ordered are listed, but only abnormal results are displayed) Labs Reviewed  CBG MONITORING, ED - Abnormal; Notable for the following components:      Result Value   Glucose-Capillary 150 (*)    All other components within normal limits  CBC  PROTIME-INR  BASIC METABOLIC PANEL WITH GFR  TROPONIN I (HIGH SENSITIVITY)     EKG  My interpretation of EKG:  Normal sinus rate of 66 without any ST elevation, T wave version aVL, normal intervals.  She does have a little bit of ST sloping in the inferior leads but I reviewed back from 09/07/2023 and she had that similar appearance before.  At that time her troponins were negative  RADIOLOGY I have reviewed the xray personally and interpreted status post CABG.  PROCEDURES:  Critical Care performed: No  .1-3 Lead EKG Interpretation  Performed by: Ernest Ronal BRAVO, MD Authorized by: Ernest Ronal BRAVO, MD     Interpretation: normal     ECG rate:  70   ECG rate assessment: normal     Rhythm: sinus rhythm     Ectopy: none     Conduction: normal      MEDICATIONS ORDERED IN ED: Medications  sodium chloride  0.9 % bolus 500 mL (0 mLs Intravenous Stopped 10/09/23 2306)  ondansetron  (ZOFRAN ) injection 4 mg (4 mg Intravenous Given 10/09/23 2153)  HYDROmorphone  (DILAUDID ) injection 0.5 mg (0.5 mg Intravenous Given 10/09/23 2153)  iohexol  (OMNIPAQUE ) 350 MG/ML injection 80 mL (80 mLs Intravenous Contrast Given 10/09/23 2206)     IMPRESSION / MDM / ASSESSMENT AND PLAN / ED COURSE  I reviewed the triage vital signs and the nursing notes.   Patient's presentation is most consistent with acute presentation with potential threat to life or bodily function.   Patient comes in with syncopal episode with report of chest pain prior to coming in does report some abdominal pain as well but sounds like that could be  related to some of her medications.  Given the migratory sense of pain we will get CT imaging to ensure no dissection she denies any shortness of breath and there is no signs of DVT on exam to suggest PE  Glucose normal CBC reassuring coags normal Patient does have creatinine of 1.9 will give patient some gentle fluids given does not have a history of CHF And a troponin slightly elevated but he said she has had similar elevations before could be related to AKI will need a repeat  Patient we had off to oncoming team pending CT imaging, repeat troponin and final disposition Patient did report some increasing nausea not better with the Zofran .  Reports taking Phenergan  previously with some help.  Reports allergy to Reglan.  Will prescribe 12.5 of Phenergan  while waiting the rest of the results.  Sinus rate of 64 without any ST elevation or T wave inversions but does have a prolonged QTc at 517.  Given this I will hold off on the Phenergan  for now and I will give some Versed  due to Ativan  shortage.  Suspect patient will require admission given patient has AKI, chest pain in the setting of prior CABG with syncopal episode and does need to have echocardiogram, continued nausea and vomiting with prolonged QTc making it difficult to send home on oral medications.  The patient is on the cardiac monitor to evaluate for evidence of arrhythmia and/or significant heart rate changes.      FINAL CLINICAL IMPRESSION(S) / ED DIAGNOSES   Final diagnoses:  Syncope, unspecified syncope type  Nonspecific chest pain     Rx / DC Orders   ED Discharge Orders     None        Note:  This document was prepared using Dragon voice recognition software and may include unintentional dictation errors.   Ernest Ronal BRAVO, MD 10/09/23 2318    Ernest Ronal BRAVO, MD 10/09/23 7672    Ernest Ronal BRAVO, MD 10/09/23 [id number]

## 2023-10-09 NOTE — ED Notes (Addendum)
 RAPID RESPONSE CALLED OVER HEAD IN ED.  Per radiology, pt was being wheeled down ED hallway toward radiology department when pt went unresponsive for 5-10 seconds, than came to and projectile vomited. Pt was diaphoretic on ED arrival with dry heaves. Pt was A&O x4 and stated her chest hurt. Pt taken to ED room 2, placed on monitor, cleaned and placed in hospital gown.   CBG and EKG obtained.   EDP made aware

## 2023-10-10 DIAGNOSIS — N179 Acute kidney failure, unspecified: Secondary | ICD-10-CM

## 2023-10-10 DIAGNOSIS — R079 Chest pain, unspecified: Secondary | ICD-10-CM | POA: Diagnosis not present

## 2023-10-10 DIAGNOSIS — Z951 Presence of aortocoronary bypass graft: Secondary | ICD-10-CM | POA: Diagnosis not present

## 2023-10-10 DIAGNOSIS — R55 Syncope and collapse: Secondary | ICD-10-CM | POA: Diagnosis not present

## 2023-10-10 DIAGNOSIS — Z8673 Personal history of transient ischemic attack (TIA), and cerebral infarction without residual deficits: Secondary | ICD-10-CM

## 2023-10-10 DIAGNOSIS — I10 Essential (primary) hypertension: Secondary | ICD-10-CM

## 2023-10-10 DIAGNOSIS — R109 Unspecified abdominal pain: Secondary | ICD-10-CM | POA: Diagnosis present

## 2023-10-10 DIAGNOSIS — I5032 Chronic diastolic (congestive) heart failure: Secondary | ICD-10-CM

## 2023-10-10 DIAGNOSIS — E66813 Obesity, class 3: Secondary | ICD-10-CM

## 2023-10-10 DIAGNOSIS — E785 Hyperlipidemia, unspecified: Secondary | ICD-10-CM | POA: Diagnosis not present

## 2023-10-10 DIAGNOSIS — J449 Chronic obstructive pulmonary disease, unspecified: Secondary | ICD-10-CM

## 2023-10-10 DIAGNOSIS — R9431 Abnormal electrocardiogram [ECG] [EKG]: Secondary | ICD-10-CM | POA: Diagnosis present

## 2023-10-10 LAB — CBC
HCT: 37.6 % (ref 36.0–46.0)
Hemoglobin: 12.6 g/dL (ref 12.0–15.0)
MCH: 29.4 pg (ref 26.0–34.0)
MCHC: 33.5 g/dL (ref 30.0–36.0)
MCV: 87.6 fL (ref 80.0–100.0)
Platelets: 242 K/uL (ref 150–400)
RBC: 4.29 MIL/uL (ref 3.87–5.11)
RDW: 13.9 % (ref 11.5–15.5)
WBC: 7.7 K/uL (ref 4.0–10.5)
nRBC: 0 % (ref 0.0–0.2)

## 2023-10-10 LAB — CBG MONITORING, ED
Glucose-Capillary: 118 mg/dL — ABNORMAL HIGH (ref 70–99)
Glucose-Capillary: 124 mg/dL — ABNORMAL HIGH (ref 70–99)

## 2023-10-10 LAB — BASIC METABOLIC PANEL WITH GFR
Anion gap: 9 (ref 5–15)
BUN: 18 mg/dL (ref 6–20)
CO2: 28 mmol/L (ref 22–32)
Calcium: 9.2 mg/dL (ref 8.9–10.3)
Chloride: 103 mmol/L (ref 98–111)
Creatinine, Ser: 1.18 mg/dL — ABNORMAL HIGH (ref 0.44–1.00)
GFR, Estimated: 56 mL/min — ABNORMAL LOW (ref >60)
Glucose, Bld: 134 mg/dL — ABNORMAL HIGH (ref 70–99)
Potassium: 3.9 mmol/L (ref 3.5–5.1)
Sodium: 140 mmol/L (ref 135–145)

## 2023-10-10 LAB — MAGNESIUM: Magnesium: 2.1 mg/dL (ref 1.7–2.4)

## 2023-10-10 LAB — TROPONIN I (HIGH SENSITIVITY)
Troponin I (High Sensitivity): 18 ng/L — ABNORMAL HIGH (ref <18)
Troponin I (High Sensitivity): 21 ng/L — ABNORMAL HIGH (ref <18)
Troponin I (High Sensitivity): 19 ng/L — ABNORMAL HIGH (ref <18)

## 2023-10-10 LAB — APTT: aPTT: 34 s (ref 24–36)

## 2023-10-10 MED ORDER — ATORVASTATIN CALCIUM 20 MG PO TABS
80.0000 mg | ORAL_TABLET | Freq: Every day | ORAL | Status: DC
Start: 2023-10-10 — End: 2023-10-10
  Administered 2023-10-10 (×2): 80 mg via ORAL
  Filled 2023-10-10: qty 4

## 2023-10-10 MED ORDER — ASPIRIN 81 MG PO TBEC
81.0000 mg | DELAYED_RELEASE_TABLET | Freq: Every day | ORAL | Status: DC
Start: 2023-10-10 — End: 2023-10-10
  Administered 2023-10-10 (×2): 81 mg via ORAL
  Filled 2023-10-10: qty 1

## 2023-10-10 MED ORDER — CARVEDILOL 6.25 MG PO TABS: 6.1250 mg | ORAL_TABLET | Freq: Two times a day (BID) | ORAL | 0 refills | Status: AC

## 2023-10-10 MED ORDER — SODIUM CHLORIDE 0.9 % IV SOLN: INTRAVENOUS | Status: DC

## 2023-10-10 MED ORDER — INSULIN GLARGINE-YFGN 100 UNIT/ML ~~LOC~~ SOLN
4.0000 [IU] | Freq: Every day | SUBCUTANEOUS | Status: DC
  Administered 2023-10-10 (×2): 4 [IU] via SUBCUTANEOUS
  Filled 2023-10-10 (×2): qty 0.04

## 2023-10-10 MED ORDER — CLOPIDOGREL BISULFATE 75 MG PO TABS
75.0000 mg | ORAL_TABLET | Freq: Every day | ORAL | Status: DC
Start: 2023-10-10 — End: 2023-10-10
  Administered 2023-10-10 (×2): 75 mg via ORAL
  Filled 2023-10-10: qty 1

## 2023-10-10 MED ORDER — DIPHENHYDRAMINE HCL 50 MG/ML IJ SOLN: 12.5000 mg | Freq: Three times a day (TID) | INTRAMUSCULAR | Status: DC | PRN

## 2023-10-10 MED ORDER — INSULIN ASPART 100 UNIT/ML IJ SOLN: 0.0000 [IU] | Freq: Every day | INTRAMUSCULAR | Status: DC

## 2023-10-10 MED ORDER — HYDROCODONE-ACETAMINOPHEN 5-325 MG PO TABS: 1.0000 | ORAL_TABLET | ORAL | Status: DC | PRN

## 2023-10-10 MED ORDER — PANTOPRAZOLE SODIUM 40 MG PO TBEC: 40.0000 mg | DELAYED_RELEASE_TABLET | Freq: Two times a day (BID) | ORAL | Status: DC | PRN

## 2023-10-10 MED ORDER — AMLODIPINE BESYLATE 5 MG PO TABS
10.0000 mg | ORAL_TABLET | Freq: Every day | ORAL | Status: DC
Start: 2023-10-10 — End: 2023-10-10
  Administered 2023-10-10 (×2): 10 mg via ORAL
  Filled 2023-10-10: qty 2

## 2023-10-10 MED ORDER — ALBUTEROL SULFATE (2.5 MG/3ML) 0.083% IN NEBU: 2.5000 mg | INHALATION_SOLUTION | RESPIRATORY_TRACT | Status: DC | PRN

## 2023-10-10 MED ORDER — RANOLAZINE ER 500 MG PO TB12
1000.0000 mg | ORAL_TABLET | Freq: Two times a day (BID) | ORAL | Status: DC
  Administered 2023-10-10 (×2): 1000 mg via ORAL
  Filled 2023-10-10: qty 2

## 2023-10-10 MED ORDER — CARVEDILOL 6.25 MG PO TABS
25.0000 mg | ORAL_TABLET | Freq: Two times a day (BID) | ORAL | Status: DC
  Filled 2023-10-10: qty 4

## 2023-10-10 MED ORDER — INSULIN ASPART 100 UNIT/ML IJ SOLN: 0.0000 [IU] | Freq: Three times a day (TID) | INTRAMUSCULAR | Status: DC

## 2023-10-10 MED ORDER — LABETALOL HCL 5 MG/ML IV SOLN: 10.0000 mg | INTRAVENOUS | Status: DC | PRN

## 2023-10-10 MED ORDER — HYDROMORPHONE HCL 1 MG/ML IJ SOLN
1.0000 mg | INTRAMUSCULAR | Status: DC | PRN
  Administered 2023-10-10 (×2): 1 mg via INTRAVENOUS
  Filled 2023-10-10: qty 1

## 2023-10-10 MED ORDER — DM-GUAIFENESIN ER 30-600 MG PO TB12: 1.0000 | ORAL_TABLET | Freq: Two times a day (BID) | ORAL | Status: DC | PRN

## 2023-10-10 MED ORDER — ACETAMINOPHEN 325 MG PO TABS: 650.0000 mg | ORAL_TABLET | Freq: Four times a day (QID) | ORAL | Status: DC | PRN

## 2023-10-10 MED ORDER — POTASSIUM CHLORIDE CRYS ER 20 MEQ PO TBCR
40.0000 meq | EXTENDED_RELEASE_TABLET | Freq: Once | ORAL | Status: AC
  Administered 2023-10-10 (×2): 40 meq via ORAL
  Filled 2023-10-10: qty 2

## 2023-10-10 MED ORDER — HEPARIN SODIUM (PORCINE) 5000 UNIT/ML IJ SOLN
5000.0000 [IU] | Freq: Three times a day (TID) | INTRAMUSCULAR | Status: DC
  Filled 2023-10-10: qty 1

## 2023-10-10 MED ORDER — METHOCARBAMOL 500 MG PO TABS
500.0000 mg | ORAL_TABLET | Freq: Three times a day (TID) | ORAL | Status: DC | PRN
  Administered 2023-10-10 (×2): 500 mg via ORAL
  Filled 2023-10-10: qty 1

## 2023-10-10 MED ORDER — EZETIMIBE 10 MG PO TABS
10.0000 mg | ORAL_TABLET | Freq: Every day | ORAL | Status: DC
Start: 2023-10-10 — End: 2023-10-10
  Administered 2023-10-10 (×2): 10 mg via ORAL
  Filled 2023-10-10: qty 1

## 2023-10-10 NOTE — Consult Note (Signed)
 [facility] CLINIC CARDIOLOGY CONSULT NOTE       Patient ID: Kayla Caldwell MRN: [id number] DOB/AGE: Sep 15, 1975 48 y.o.  Admit date: 10/09/2023 Referring Physician Dr. Hilma Primary Physician Mangel, Benison Pap, MD Primary Cardiologist Dr. [place] Lee [facility]) Reason for Consultation syncope  HPI: Kayla Caldwell is a 48 y.o. female  with a past medical history of coronary artery disease s/p multiple stents s/p CABG x4 (07/2020), hypertension, hyperlipidemia, chronic HFpEF, type 2 diabetes mellitus, obesity, history of syncopal events, hx CVA who presented to the ED on 10/09/2023 for pleuritic chest pain that only occurred with deep breathing for 1 day and abdominal pain with a/w nausea.  Patient had a syncopal event while sitting in a wheelchair in radiology yesterday. Patient denies chest pain, SOB or palpitations before or after the syncopal event.  Cardiology was consulted for further evaluation.   Work up in the ED notable for sodium 137, potassium 3.1, Mg 2.1, creatinine 1.92, hemoglobin 12.8, platelets 287.  LFTs within normal limits.  Troponins minimally elevated and flat 29 > 23> 19. EKG without acute ischemic changes.  CTA without evidence of aortic injury or evidence of pulmonary embolism.  CXR without acute cardiopulmonary disease.  Patient received 2 doses of IV Dilaudid  0.5 mg in the ED.  At the time of my evaluation this AM, patient was resting comfortably in ED stretcher.  We discussed patient's symptoms in further detail.  Patient states she came to the ED because she did not feel well overall.  Patient states she had sharp pleuritic chest pain that only occurs with deep breathing that started yesterday.  Patient states that she arrived to the ED and received Dilaudid  her chest pain resolved and no recurrence.  Patient states she is active and was cleaning at work and cleaning to her house yesterday and denies any exertional symptoms.  Patient denies any SOB, palpitations,  lightheadedness or chest pain currently.  Patient states prior to her syncopal event and radiology she felt nauseous and lightheaded.  Patient states she has a long history of syncopal events and has been worked up for this before.  Patient believes her syncopal events are related to medication changes.  Patient states she was started on chlorthalidone yesterday and believes that she was dehydrated and that is why she had this syncopal event yesterday.  Patient states before her syncopal event her chest pain resolved earlier after she arrived to the ED and received Dilaudid .  Currently patient states she feels really good without any cardiac symptoms and is very eager to go home today.  Patient states prior to her multiple stents and CABG she used to have severe exertional dyspnea however states she has had no issues or concerns with any exertional symptoms s/p CABG in 2022.  Patient also states she had a recent workup with echocardiogram and PET stress that were normal with no concerning abnormalities. Patient states she does not want to stay another night in the hospital.  Pertinent Cardiac History (Most recent) PET CT (07/25/2023) - There is a very small, mild in severity, fixed defect involving the apical segment. This is consistent with probable artifact or less likely possible subtle scar.  - Left ventricular systolic function is normal. Post stress the ejection fraction is > 60%.  - Diffuse coronary calcifications are noted  - Attenuation CT scan shows post CABG findings   Echo (04/2023)   1. The left ventricle is normal in size with normal wall thickness.    2. The  left ventricular systolic function is normal, LVEF is visually  estimated at > 55%.    3. The right ventricle is normal in size, with normal systolic function.    4. There is moderate pulmonic regurgitation.   Review of systems complete and found to be negative unless listed above    Past Medical History:  Diagnosis Date   CHF  (congestive heart failure) (HCC)    COPD (chronic obstructive pulmonary disease) (HCC)    Diabetes (HCC)    Diabetes mellitus without complication (HCC)    Heart disease    Heart failure (HCC)    Hyperlipidemia    Hypertension    MI (myocardial infarction) (HCC)     Past Surgical History:  Procedure Laterality Date   ABDOMINAL HYSTERECTOMY     CARDIAC SURGERY     CORONARY ARTERY BYPASS GRAFT      (Not in a hospital admission)  Social History   Socioeconomic History   Marital status: Single    Spouse name: Not on file   Number of children: Not on file   Years of education: Not on file   Highest education level: Not on file  Occupational History   Not on file  Tobacco Use   Smoking status: Never    Passive exposure: Never   Smokeless tobacco: Never  Vaping Use   Vaping status: Never Used  Substance and Sexual Activity   Alcohol use: Never   Drug use: Never   Sexual activity: Not Currently  Other Topics Concern   Not on file  Social History Narrative   ** Merged History Encounter **       Social Drivers of Health   Financial Resource Strain: Not on file  Food Insecurity: Food Insecurity Present (07/18/2023)   Received from [facility]   Hunger Vital Sign    Within the past 12 months, you worried that your food would run out before you got the money to buy more.: Sometimes true    Within the past 12 months, the food you bought just didn't last and you didn't have money to get more.: Sometimes true  Transportation Needs: No Transportation Needs (07/18/2023)   Received from [facility] - Transportation    Lack of Transportation (Medical): No    Lack of Transportation (Non-Medical): No  Physical Activity: Not on file  Stress: Not on file  Social Connections: Not on file  Intimate Partner Violence: Not on file    Family History  Problem Relation Age of Onset   Hypertension Mother    Heart disease Mother    Depression Father    Hypertension  Father    Heart disease Father    Breast cancer Maternal Aunt    Cancer Maternal Aunt      Vitals:   10/10/23 0600 10/10/23 0636 10/10/23 0646 10/10/23 0817  BP:  (!) 140/79  123/64  Pulse: 66  65 (!) 58  Resp: 13   14  Temp:    97.9 F (36.6 C)  TempSrc:    Oral  SpO2: 99%  100% 98%  Weight:      Height:        PHYSICAL EXAM General: Well-appearing female, well nourished, in no acute distress. HEENT: Normocephalic and atraumatic. Neck: No JVD.   Lungs: Normal respiratory effort on room air. Clear bilaterally to auscultation. No wheezes, crackles, rhonchi.  Heart: HRRR. Normal S1 and S2 without gallops or murmurs.  Abdomen: Non-distended appearing.  Msk: Normal  strength and tone for age. Extremities: Warm and well perfused. No clubbing, cyanosis, edema.  Neuro: Alert and oriented X 3. Psych: Answers questions appropriately.   Labs: Basic Metabolic Panel: Recent Labs    10/09/23 2046 10/10/23 0038 10/10/23 0701  NA 137  --  140  K 3.1*  --  3.9  CL 100  --  103  CO2 26  --  28  GLUCOSE 179*  --  134*  BUN 23*  --  18  CREATININE 1.92*  --  1.18*  CALCIUM  9.7  --  9.2  MG  --  2.1  --    Liver Function Tests: Recent Labs    10/09/23 2045  AST 19  ALT 12  ALKPHOS 81  BILITOT 0.9  PROT 8.1  ALBUMIN 4.1   Recent Labs    10/09/23 2045  LIPASE 30   CBC: Recent Labs    10/09/23 2046 10/10/23 0426  WBC 7.5 7.7  HGB 12.8 12.6  HCT 37.1 37.6  MCV 84.5 87.6  PLT 287 242   Cardiac Enzymes: Recent Labs    10/09/23 2310 10/10/23 0426 10/10/23 0701  TROPONINIHS 23* 18* 21*   BNP: No results for input(s): BNP in the last 72 hours. D-Dimer: No results for input(s): DDIMER in the last 72 hours. Hemoglobin A1C: No results for input(s): HGBA1C in the last 72 hours. Fasting Lipid Panel: No results for input(s): CHOL, HDL, LDLCALC, TRIG, CHOLHDL, LDLDIRECT in the last 72 hours. Thyroid Function Tests: No results for input(s):  TSH, T4TOTAL, T3FREE, THYROIDAB in the last 72 hours.  Invalid input(s): FREET3 Anemia Panel: No results for input(s): VITAMINB12, FOLATE, FERRITIN, TIBC, IRON, RETICCTPCT in the last 72 hours.   Radiology: CT Angio Chest/Abd/Pel for Dissection W and/or Wo Contrast Result Date: 10/09/2023 CLINICAL DATA:  Chest pain for 1 day, initial encounter EXAM: CT ANGIOGRAPHY CHEST, ABDOMEN AND PELVIS TECHNIQUE: Non-contrast CT of the chest was initially obtained. Multidetector CT imaging through the chest, abdomen and pelvis was performed using the standard protocol during bolus administration of intravenous contrast. Multiplanar reconstructed images and MIPs were obtained and reviewed to evaluate the vascular anatomy. RADIATION DOSE REDUCTION: This exam was performed according to the departmental dose-optimization program which includes automated exposure control, adjustment of the mA and/or kV according to patient size and/or use of iterative reconstruction technique. CONTRAST:  80mL OMNIPAQUE  IOHEXOL  350 MG/ML SOLN COMPARISON:  Chest x-ray from earlier in the same day. FINDINGS: CTA CHEST FINDINGS Cardiovascular: Initial precontrast images demonstrate evidence of prior coronary bypass grafting. No hyperdense crescent is noted in the aorta to suggest acute aortic injury. Post-contrast images demonstrate no evidence of dissection. No aneurysmal dilatation is seen. The heart is not significantly enlarged. The pulmonary artery shows no evidence of pulmonary embolism although not timed for embolus evaluation. Changes of coronary bypass grafting are noted. Heavy coronary calcifications are seen. Mediastinum/Nodes: Thoracic inlet is within normal limits. No hilar or mediastinal adenopathy is noted. The esophagus as visualized is within normal limits. Lungs/Pleura: The lungs are well aerated bilaterally. No focal infiltrate or sizable effusion is noted. No parenchymal nodule is seen. Musculoskeletal:  Degenerative changes of the thoracic spine are noted. No acute rib abnormality is seen. Review of the MIP images confirms the above findings. CTA ABDOMEN AND PELVIS FINDINGS VASCULAR Aorta: Abdominal aorta demonstrates scattered atherosclerotic calcifications without aneurysmal dilatation or dissection. Celiac: Patent without evidence of aneurysm, dissection, vasculitis or significant stenosis. SMA: Patent without evidence of aneurysm, dissection, vasculitis or significant  stenosis. Renals: Mild calcifications are noted within the renal arteries. 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 IMA: Diminutive but patent. Inflow: Iliacs demonstrate atherosclerotic calcifications without aneurysmal dilatation or dissection. Veins: No specific venous abnormality is noted. Review of the MIP images confirms the above findings. NON-VASCULAR Hepatobiliary: Gallbladder is within normal limits. Liver shows no acute abnormality. Pancreas: Unremarkable. No pancreatic ductal dilatation or surrounding inflammatory changes. Spleen: Normal in size without focal abnormality. Adrenals/Urinary Tract: Adrenal glands are within normal limits. Kidneys are well visualized bilaterally. No renal calculi or obstructive changes are noted. The bladder is decompressed. Stomach/Bowel: No obstructive or inflammatory changes of the colon are noted. Scattered fecal material is seen throughout the colon. No appendiceal abnormality is noted. Small bowel and stomach are within normal limits. Lymphatic: No lymphadenopathy is seen. Reproductive: Status post hysterectomy. No adnexal masses. Other: No abdominal wall hernia or abnormality. No abdominopelvic ascites. Musculoskeletal: Degenerative changes of lumbar spine are seen. Review of the MIP images confirms the above findings. IMPRESSION: CTA of the chest: No aortic injury is identified. No evidence of pulmonary emboli. No acute abnormality is seen. CTA of the abdomen and  pelvis: No acute arterial abnormality is noted. No acute abnormality to correspond with the given clinical history. Electronically Signed   By: Oneil Devonshire M.D.   On: 10/09/2023 23:39   DG Chest Port 1 View Result Date: 10/09/2023 CLINICAL DATA:  Chest pain EXAM: PORTABLE CHEST 1 VIEW COMPARISON:  Chest x-ray 09/06/2023. FINDINGS: The heart is borderline enlarged. Patient is status post cardiac surgery. There is no pleural effusion or pneumothorax. Both lungs are clear. The visualized skeletal structures are unremarkable. IMPRESSION: No active disease. Borderline cardiomegaly. Electronically Signed   By: Greig Pique M.D.   On: 10/09/2023 22:49    ECHO 04/2023 1. The left ventricle is normal in size with normal wall thickness.   2. The left ventricular systolic function is normal, LVEF is visually  estimated at > 55%.   3. The right ventricle is normal in size, with normal systolic function.   4. There is moderate pulmonic regurgitation.    TELEMETRY reviewed by me 10/10/2023: Sinus rhythm, rate 50s  EKG reviewed by me: Sinus rhythm, rate 64 bpm without acute ischemic changes.  Data reviewed by me 10/10/2023: last 24h vitals tele labs imaging I/O ED provider note, admission H&P.  Principal Problem:   Chest pain Active Problems:   CAD s/p  CABG x4 07/2020   Hypertension   HLD (hyperlipidemia)   COPD (chronic obstructive pulmonary disease) (HCC)   Obesity, Class III, BMI 40-49.9 (morbid obesity)   Chronic diastolic CHF (congestive heart failure) (HCC)   History of CVA (cerebrovascular accident)   AKI (acute kidney injury) (HCC)   Prolonged QT interval   Syncope   Abdominal pain    ASSESSMENT AND PLAN:  Kayla Caldwell is a 48 y.o. female  with a past medical history of coronary artery disease s/p multiple stents s/p CABG x4 (07/2020), hypertension, hyperlipidemia, chronic HFpEF, type 2 diabetes mellitus, obesity, history of syncopal events, hx CVA who presented to the ED on  10/09/2023 for pleuritic chest pain that only occurred with deep breathing for 1 day and abdominal pain with a/w nausea.  Patient had a syncopal event while sitting in a wheelchair in radiology yesterday. Patient denies chest pain, SOB or palpitations before or after the syncopal event.  Cardiology was consulted for further evaluation.   # Syncope Patient with  hx syncope. No chest pain, SOB, palpitations before/after syncopal event. Occurred at rest.  Per telemetry no arrhythmias reported overnight.  Electrolytes are stable. Cr elevated upon admission, Cr 1.92 > 1.18 s/p IV fluids. Likely dehydrated. Patient likely presented clinically dry as IV fluids improved renal function and patient reports feeling overall better. -Monitor and replenish electrolytes for a goal K >4, Mag >2  -Patient eager to go home and does not want to stay another night in the hospital.  Patient states she will call her primary cardiologist Dr. Jama to have cardiac monitor placed for further evaluation of syncopal events.  # CAD s/p multiple stents, CABG x 4 (2022) # Hypertension # Hyperlipidemia Patient without chest pain, no concern with exertional sxs.Troponins minimally elevated and flat 29 > 23> 19. EKG without acute ischemic changes. Recent PET stress study performed on 06/2023 that revealed normal perfusion and overall low risk study.  Of note patient has documented allergy to nitroglycerin . - Continue home aspirin  81 mg, atorvastatin  80 mg, Zetia  10 mg, Plavix  75 mg daily. - Continue home amlodipine  10 mg daily. - Continue home Ranexa  1000 mg twice daily.  # Chronic HFpEF Patient appear euvolemic on exam.  Patient likely presented clinically dry as IV fluids improved renal function and patient reports feeling overall better.  Of note patient has documented history of recurrent yeast infections with SGLT2 inhibitors. - Recommend holding home Bumex  2 mg daily for another day as patient came and dry. Renal function has  improved s/p IV fluids. - Recommend resuming home Coreg  at outpatient follow-up due to normotensive BP and HR in 50s.  Patient very eager to go home and doesn't want to stay another night in hospital. Patient states s/p IVF she feels a lot better. Denies any chest pain, palpitations, SOB or lightheadedness. Recommend patient ambulates. Ok for discharge today from a cardiac perspective after ambulating. Recommend close follow up with primary cardiologist Dr. [place] Lee to have cardiac monitor placed.    This patient's plan of care was discussed and created with Dr. Ammon and he is in agreement.  Signed: Dorene Comfort, PA-C  10/10/2023, 9:21 AM [facility] Cardiology

## 2023-10-10 NOTE — ED Notes (Signed)
 Pt States chest pain left last night and has not come back. Pt states syncopal episodes are regular and follows up with cardiology for it. Pt also states concern for paying bills and says she needs to go back to work

## 2023-10-10 NOTE — ED Notes (Signed)
 CCMD notified of pt transfer to room 36.

## 2023-10-10 NOTE — ED Notes (Signed)
 Pt reports taking 8 units of lantus  at night. [place], MD messaged about this, awaiting reply. Pt c/o of back pain, denies CP, see MAR

## 2023-10-10 NOTE — H&P (Signed)
 History and Physical    Kayla Caldwell FMW:[id number] DOB: [birth year] DOA: 10/09/2023  Referring MD/NP/PA:   PCP: Keven Crumbly Pap, MD   Patient coming from:  The patient is coming from home.     Chief Complaint: chest pain, syncope  HPI: Kayla Caldwell is a 48 y.o. female with medical history significant of CAD, s/p of CABG 07/2020, HTN, HLD,  DM, dCHF, obesity, who present with chest pain and syncope.  Pt states that her chest pain started around 11:00 AM, which is located in the middle and left side of her chest, constant, sharp, 7 out of 10 in severity, pleuritic, aggravated by deep breath.  No cough, SOB, fever or chills.  She also reports nausea with few episodes of nonbilious nonbloody vomiting, no diarrhea.  She states that she has abdominal pain which is located epigastric area, aching, mild to moderate, nonradiating, not aggravated or alleviated by any known factors.  She states that it may be related to her Ozempic use.  No symptoms of UTI.  Per RN report, when pt was transferred to the radiology from ED, patient suddenly became unresponsive, event lasted for about 30 seconds.  Patient still had pulse and respiration.  No seizure activity.  Patient had vomited several times after the event.  Patient states that she felt dizzy, no unilateral numbness or tingling in extremities.  No facial droop or slurred speech.  Data reviewed independently and ED Course: pt was found to have  trop 29 --> 23, BNP 84.3, WBC 7.5, INR 1.0, potassium 3.1, AKI, temperature normal, blood pressure 157/86, heart rate 70, RR 14, oxygen saturation 100% on room air.  Patient is placed in telemetry bed for observation.  Message sent to Dr. Ammon of cardiology for consult..  CTA of chest/abdomen/pelvis: CTA of the chest: No aortic injury is identified. No evidence of pulmonary emboli.   No acute abnormality is seen.   CTA of the abdomen and pelvis: No acute arterial abnormality is noted.  No acute abnormality to correspond with the given clinical history.   EKG: I have personally reviewed.  Sinus rhythm, QTc 492, ST elevation in V1-V3, ST depression in lead I/aVL which is similar to previous EKG on 09/06/2023.  The repeated EKG after syncope event showed QTc 517.   Review of Systems:   General: no fevers, chills, no body weight gain, has fatigue HEENT: no blurry vision, hearing changes or sore throat Respiratory: no dyspnea, coughing, wheezing CV: has chest pain, no palpitations GI: has nausea, vomiting, abdominal pain, no diarrhea, constipation GU: no dysuria, burning on urination, increased urinary frequency, hematuria  Ext: no leg edema Neuro: no unilateral weakness, numbness, or tingling, no vision change or hearing loss.  Has dizziness and syncope. Skin: no rash, no skin tear. MSK: No muscle spasm, no deformity, no limitation of range of movement in spin Heme: No easy bruising.  Travel history: No recent long distant travel.   Allergy:  Allergies  Allergen Reactions   Losartan Other (See Comments)    Caused BP to drop and fainted   Caused BP to drop and fainted   Pantoprazole  Other (See Comments) and Swelling   Empagliflozin     Recurrent yeast infections   Compazine [Prochlorperazine]    Hydralazine  Other (See Comments)    Reaction Type: Side Effect; Reaction(s): racing thoughts Reaction Type: Side Effect; Reaction(s): racing thoughts Anxiety, racing thoughts Anxiety, racing thoughts    Lisinopril    Metoclopramide Other (See Comments)  Other reaction(s): Hallucinations   Naproxen    Nitroglycerin     Nsaids Hives    Reaction Type: Allergy; Severity: Mild   Tape Hives   Toradol [Ketorolac Tromethamine]    Tramadol    Duloxetine Hives   Wound Dressing Adhesive Other (See Comments)   Wound Dressings Other (See Comments)    Past Medical History:  Diagnosis Date   CHF (congestive heart failure) (HCC)    COPD (chronic obstructive pulmonary  disease) (HCC)    Diabetes (HCC)    Diabetes mellitus without complication (HCC)    Heart disease    Heart failure (HCC)    Hyperlipidemia    Hypertension    MI (myocardial infarction) (HCC)     Past Surgical History:  Procedure Laterality Date   ABDOMINAL HYSTERECTOMY     CARDIAC SURGERY     CORONARY ARTERY BYPASS GRAFT      Social History:  reports that she has never smoked. She has never been exposed to tobacco smoke. She has never used smokeless tobacco. She reports that she does not drink alcohol and does not use drugs.  Family History:  Family History  Problem Relation Age of Onset   Hypertension Mother    Heart disease Mother    Depression Father    Hypertension Father    Heart disease Father    Breast cancer Maternal Aunt    Cancer Maternal Aunt      Prior to Admission medications   Medication Sig Start Date End Date Taking? Authorizing Provider  albuterol  (VENTOLIN  HFA) 108 (90 Base) MCG/ACT inhaler Inhale 2 puffs into the lungs every 6 (six) hours as needed for wheezing or shortness of breath. 03/19/21   Ernest Ronal BRAVO, MD  amLODipine  (NORVASC ) 10 MG tablet Take 1 tablet (10 mg total) by mouth daily. 10/27/20   Britta King, MD  aspirin  EC 81 MG EC tablet Take 1 tablet (81 mg total) by mouth daily. Swallow whole. 04/13/21   Patel, Sona, MD  atorvastatin  (LIPITOR ) 80 MG tablet Take 1 tablet (80 mg total) by mouth daily. 10/27/20   Masoud, Javed, MD  Bacillus Coagulans-Inulin (PROBIOTIC) 1-250 BILLION-MG CAPS Take 1 capsule by mouth daily. 09/28/21   Viviann Pastor, MD  bumetanide  (BUMEX ) 2 MG tablet Take 2 mg by mouth daily.    [provider]  carvedilol  (COREG ) 25 MG tablet Take 1 tablet (25 mg total) by mouth 2 (two) times daily with a meal. 10/27/20   Britta King, MD  chlorthalidone (HYGROTON) 25 MG tablet Take 25 mg by mouth daily.    [provider]  clopidogrel  (PLAVIX ) 75 MG tablet Take 75 mg by mouth daily.    [provider]   clotrimazole -betamethasone  (LOTRISONE ) cream Apply 1 Application topically daily. 05/02/23   Tobie Franky SQUIBB, DPM  Continuous Blood Gluc Sensor (FREESTYLE LIBRE 14 DAY SENSOR) MISC 1 each by Does not apply route every 14 (fourteen) days. 11/30/20   Britta King, MD  cyclobenzaprine  (FLEXERIL ) 5 MG tablet Take 1-2 tablets (5-10 mg total) by mouth 3 (three) times daily as needed for muscle spasms. 12/23/22   Charlene Debby BROCKS, PA-C  ezetimibe  (ZETIA ) 10 MG tablet Take 1 tablet (10 mg total) by mouth daily. 10/27/20   Britta King, MD  HYDROcodone -acetaminophen  (NORCO/VICODIN) 5-325 MG tablet Take 1 tablet by mouth every 6 (six) hours as needed. 07/21/23   Gershon Donnice SAUNDERS, DPM  hydrOXYzine  (ATARAX ) 25 MG tablet Take 1 tablet (25 mg total) by mouth every 6 (six) hours  as needed for itching. 09/20/23   Arvis Huxley B, PA-C  insulin  glargine (LANTUS ) 100 UNIT/ML injection Inject 5 Units into the skin at bedtime.    [provider]  insulin  lispro (HUMALOG) 100 UNIT/ML injection Inject 2 Units into the skin 3 (three) times daily before meals.    [provider]  ketoconazole  (NIZORAL ) 2 % cream Apply 1 Application topically daily. 07/27/23   Christine Rush, DPM  lidocaine  (LIDODERM ) 5 % Place 1 patch onto the skin daily. Remove & Discard patch within 12 hours or as directed by MD 06/12/21   Awanda City, MD  magnesium  oxide (MAG-OX) 400 MG tablet Take 400 mg by mouth daily.    [provider]  methocarbamol  (ROBAXIN ) 500 MG tablet Take 1 tablet (500 mg total) by mouth every 8 (eight) hours as needed for muscle spasms. 10/19/22   Arlander Charleston, MD  ondansetron  (ZOFRAN -ODT) 4 MG disintegrating tablet Take 1 tablet (4 mg total) by mouth every 8 (eight) hours as needed for nausea or vomiting. 11/03/22   Paduchowski, Kevin, MD  pantoprazole  (PROTONIX ) 40 MG tablet Take 1 tablet (40 mg total) by mouth 2 (two) times daily as needed. Home med. 06/12/21   Awanda City, MD  potassium chloride  SA (KLOR-CON   M) 20 MEQ tablet TAKE 1 TABLET BY MOUTH DAILY 03/12/21   Britta King, MD  ranolazine  (RANEXA ) 1000 MG SR tablet Take 1 tablet (1,000 mg total) by mouth 2 (two) times daily. 10/27/20   Masoud, Javed, MD  senna (SENOKOT) 8.6 MG TABS tablet Take 1 tablet (8.6 mg total) by mouth at bedtime as needed. 06/12/21   Awanda City, MD    Physical Exam: Vitals:   10/09/23 2047 10/09/23 2305 10/10/23 0000 10/10/23 0053  BP: 101/60 (!) 157/86 (!) 144/87   Pulse: 70 68 79   Resp: 18 14 10    Temp: 98.1 F (36.7 C)   97.7 F (36.5 C)  TempSrc: Oral   Oral  SpO2: 99% 100% 99%   Weight:      Height:       General: Not in acute distress.  Dry mucous membrane HEENT:       Eyes: PERRL, EOMI, no jaundice       ENT: No discharge from the ears and nose, no pharynx injection, no tonsillar enlargement.        Neck: No JVD, no bruit, no mass felt. Heme: No neck lymph node enlargement. Cardiac: S1/S2, RRR, No murmurs, No gallops or rubs. Respiratory: No rales, wheezing, rhonchi or rubs. GI: Soft, nondistended, has mild tenderness in epigastric area, no rebound pain, no organomegaly, BS present. GU: No hematuria Ext: No pitting leg edema bilaterally. 1+DP/PT pulse bilaterally. Musculoskeletal: No joint deformities, No joint redness or warmth, no limitation of ROM in spin. Skin: No rashes.  Neuro: Alert, oriented X3, cranial nerves II-XII grossly intact, moves all extremities normally. Muscle strength 5/5 in all extremities, sensation to light touch intact Psych: Patient is not psychotic, no suicidal or hemocidal ideation.  Labs on Admission: I have personally reviewed following labs and imaging studies  CBC: Recent Labs  Lab 10/09/23 2046  WBC 7.5  HGB 12.8  HCT 37.1  MCV 84.5  PLT 287   Basic Metabolic Panel: Recent Labs  Lab 10/09/23 2046 10/10/23 0038  NA 137  --   K 3.1*  --   CL 100  --   CO2 26  --   GLUCOSE 179*  --   BUN 23*  --  CREATININE 1.92*  --   CALCIUM  9.7  --   MG  --  2.1    GFR: Estimated Creatinine Clearance: 41.5 mL/min (A) (by C-G formula based on SCr of 1.92 mg/dL (H)). Liver Function Tests: Recent Labs  Lab 10/09/23 2045  AST 19  ALT 12  ALKPHOS 81  BILITOT 0.9  PROT 8.1  ALBUMIN 4.1   Recent Labs  Lab 10/09/23 2045  LIPASE 30   No results for input(s): AMMONIA in the last 168 hours. Coagulation Profile: Recent Labs  Lab 10/09/23 2046  INR 1.0   Cardiac Enzymes: No results for input(s): CKTOTAL, CKMB, CKMBINDEX, TROPONINI in the last 168 hours. BNP (last 3 results) No results for input(s): PROBNP in the last 8760 hours. HbA1C: No results for input(s): HGBA1C in the last 72 hours. CBG: Recent Labs  Lab 10/09/23 2113 10/10/23 0056  GLUCAP 150* 124*   Lipid Profile: No results for input(s): CHOL, HDL, LDLCALC, TRIG, CHOLHDL, LDLDIRECT in the last 72 hours. Thyroid Function Tests: No results for input(s): TSH, T4TOTAL, FREET4, T3FREE, THYROIDAB in the last 72 hours. Anemia Panel: No results for input(s): VITAMINB12, FOLATE, FERRITIN, TIBC, IRON, RETICCTPCT in the last 72 hours. Urine analysis:    Component Value Date/Time   COLORURINE YELLOW (A) 07/08/2023 2216   APPEARANCEUR HAZY (A) 07/08/2023 2216   LABSPEC 1.018 07/08/2023 2216   PHURINE 6.0 07/08/2023 2216   GLUCOSEU >=500 (A) 07/08/2023 2216   HGBUR NEGATIVE 07/08/2023 2216   BILIRUBINUR NEGATIVE 07/08/2023 2216   KETONESUR NEGATIVE 07/08/2023 2216   PROTEINUR NEGATIVE 07/08/2023 2216   NITRITE NEGATIVE 07/08/2023 2216   LEUKOCYTESUR NEGATIVE 07/08/2023 2216   Sepsis Labs: @LABRCNTIP (procalcitonin:4,lacticidven:4) )No results found for this or any previous visit (from the past 240 hours).   Radiological Exams on Admission:   Assessment/Plan Principal Problem:   Chest pain Active Problems:   CAD s/p  CABG x4 07/2020   Syncope   Hypertension   HLD (hyperlipidemia)   COPD (chronic obstructive pulmonary  disease) (HCC)   Chronic diastolic CHF (congestive heart failure) (HCC)   History of CVA (cerebrovascular accident)   AKI (acute kidney injury) (HCC)   Prolonged QT interval   Abdominal pain   Obesity, Class III, BMI 40-49.9 (morbid obesity)   Assessment and Plan:  Chest pain and hx of CAD s/p  CABG x4 07/2020: Troponin slightly elevated 29 --> 23.  Her chest pain has subsided currently.  CTA negative for aortic dissection or PE. Since troponin is trending down, chest pain has subsided, will hold off IV heparin .  - admit to  tele bed as inpatient - Trend Trop - prn Dilaudid  Norco, Tylenol  for pain - pt is allergic to nitroglycerin  - ASA, Plavix  - Ranexa  - Statin: Lipitor , Zetia  - Risk factor stratification: will check FLP and A1C  - check UDS - 2d echo - Message sent to Dr. Ammon of cardiology for consult..  Syncope: Etiology is not clear, CTA negative for aortic dissection or PE.  No focal neurodeficit on physical examination.  Possibly due to vasovagal syncope versus overdiuresis.  Patient is clinically dry.  EKG showed prolonged QTc 517, but no arrhythmia.  Since patient received contrast, will not do CT of her head. -Check orthostatic vital signs - Hold diuretics - IV fluid: 500 cc normal saline x 2, then 75 cc/h - Frequent neurocheck - Fall precaution - Follow-up 2D echo  Hypertension -IV hydralazine  as needed - Hold Bumex  and Hygroton due to AKI - Amlodipine , Coreg   HLD (hyperlipidemia) -Lipitor  and Zetia   COPD (chronic obstructive pulmonary disease) (HCC): Stable -Bronchodilators and as needed Mucinex   Chronic diastolic CHF (congestive heart failure) (HCC): 2D echo on 05/02/2023 showed EF> 55%: Patient does not have leg edema.  Clinically dry.  BNP normal 84.3.  CHF is compensated. - Hold Bumex  and Hygroton  History of CVA (cerebrovascular accident) -Aspirin , Plavix , Lipitor , Zetia   AKI (acute kidney injury) (HCC): Baseline creatinine 0.89 on 09/06/2023.  Her  creatinine is 1.92, BUN 23, GFR 31.  Likely due to dehydration and continuation of diuretics. -IV fluid as above - Hold diuretics  Hypokalemia: Potassium 3.1, magnesium  2.1. - Repleted potassium  Prolonged QT interval: May be due to potassium 3.1 - Repleted potassium as above - Avoid using QT prolonging medications, such as Zofran   Abdominal pain: Patient has nausea, vomiting, epigastric abdominal pain.  Etiology is not clear.  Lipase normal, liver function normal.  May be due to viral gastritis. - Supportive care - IV fluids above - As needed Benadryl  for nausea vomiting - Continue Protonix   Obesity, Class III, BMI 40-49.9 (morbid obesity): Patient has Obesity Class III, with body weight 108.9 Kg and BMI 42.51  kg/m2.  - Encourage losing weight - Exercise and healthy diet     DVT ppx: SQ Heparin        Code Status: Full code    Family Communication:     not done, no family member is at bed side.     Disposition Plan:  Anticipate discharge back to previous environment  Consults called:  Message sent to Dr. Ammon of cardiology for consult..  Admission status and Level of care: Telemetry Cardiac:    for obs     Dispo: The patient is from: Home              Anticipated d/c is to: Home              Anticipated d/c date is: 1 day              Patient currently is not medically stable to d/c.    Severity of Illness:  The appropriate patient status for this patient is OBSERVATION. Observation status is judged to be reasonable and necessary in order to provide the required intensity of service to ensure the patient's safety. The patient's presenting symptoms, physical exam findings, and initial radiographic and laboratory data in the context of their medical condition is felt to place them at decreased risk for further clinical deterioration. Furthermore, it is anticipated that the patient will be medically stable for discharge from the hospital within 2 midnights of  admission.        Date of Service 10/10/2023    Caleb Exon [facility]   If 7PM-7AM, please contact night-coverage [email or web address] 10/10/2023, 1:22 AM

## 2023-10-10 NOTE — Discharge Summary (Signed)
 DISCHARGE SUMMARY    Kayla Caldwell FMW:[id number] DOB: 1975/08/28 DOA: 10/09/2023  PCP: Keven Crumbly Pap, MD  Admit date: 10/09/2023 Discharge date: 10/10/2023   Recommendations for Outpatient Follow-up:  With your cardiologist within 1 week for medication management request Holter monitor Follow-up with your primary care physician for chronic medication management   Hospital Course: Kayla Caldwell is a 48 year old female with CAD status post CABG June 2022, hypertension, hyperlipidemia, diabetes, diastolic heart failure, obesity on Ozempic, who presents with chest pain and syncope.  Patient reports that her chest pain was located in the middle and left side of her chest and she was concerned for MI so she presented to the ED.  She also endorses that she has recently been on Ozempic which is causing some abdominal pain, and cramping.  On arrival to the ED troponins were found to be 29 -> 23 -> 19.  BNP 84, mild AKI.  Cardiology was consulted.   While in the ED patient experienced a syncopal episode.  She reports frequent syncopal episodes.  At the time of my evaluation later on 8/12 patient reported her chest pain had completely resolved and was requesting for discharge home.  We discussed the importance of continued workup but patient reports she has close outpatient follow-up with cardiology and would prefer to follow-up with her outpatient cardiologist.  We recommended a Holter monitor to better assess for arrhythmias given her syncopal episode.  She endorses understanding and reports that she will follow-up with her cardiologist to arrange.  On-call cardiology team evaluated the patient and recommended for discharge home. While admitted, patient demonstrated persistent bradycardia in the 50s on telemetry.  She is on high-dose Coreg  twice daily, we have decreased this to 6.125 at discharge.  Patient is following up with her cardiologist after discharge today to discuss these  medication changes.  Patient was given strict ER return precautions and endorses understanding.  She was discharged directly from the ED on same day she was admitted  Problems addressed during this admission: Syncope CAD status post multiple stents, CABG x 4 in 2022 Hypertension Hyperlipidemia Chronic heart failure preserved EF, on diuretic therapy COPD History of CVA AKI Hypokalemia Prolonged QT interval Abdominal pain Class III obesity BMI 42  Discharge Instructions  Discharge Instructions     Call MD for:  difficulty breathing, headache or visual disturbances   Complete by: As directed    Call MD for:  persistant dizziness or light-headedness   Complete by: As directed    Call MD for:  persistant nausea and vomiting   Complete by: As directed    Call MD for:  severe uncontrolled pain   Complete by: As directed    Call MD for:  temperature >100.4   Complete by: As directed    Diet general   Complete by: As directed    Discharge instructions   Complete by: As directed    While admitted we made some changes to your blood pressure medications. Please review your discharge medications closely to ensure you are taking the correct meds at home. Please take your blood pressure once a day and keep a log. See your Cardiologist to review this log and make further medication changes. Please be sure to discuss the heart monitor with them   Increase activity slowly   Complete by: As directed       Allergies as of 10/10/2023       Reactions   Losartan Other (See Comments)   Caused BP  to drop and fainted  Caused BP to drop and fainted   Pantoprazole  Other (See Comments), Swelling   Empagliflozin    Recurrent yeast infections   Compazine [prochlorperazine]    Hydralazine  Other (See Comments)   Reaction Type: Side Effect; Reaction(s): racing thoughts Reaction Type: Side Effect; Reaction(s): racing thoughts Anxiety, racing thoughts Anxiety, racing thoughts   Lisinopril     Metoclopramide Other (See Comments)   Other reaction(s): Hallucinations   Naproxen    Nitroglycerin     Nsaids Hives   Reaction Type: Allergy; Severity: Mild   Tape Hives   Toradol [ketorolac Tromethamine]    Tramadol    Duloxetine Hives   Wound Dressing Adhesive Other (See Comments)   Wound Dressings Other (See Comments)        Medication List     STOP taking these medications    chlorthalidone 25 MG tablet Commonly known as: HYGROTON   methocarbamol  500 MG tablet Commonly known as: ROBAXIN        TAKE these medications    albuterol  108 (90 Base) MCG/ACT inhaler Commonly known as: VENTOLIN  HFA Inhale 2 puffs into the lungs every 6 (six) hours as needed for wheezing or shortness of breath.   amLODipine  10 MG tablet Commonly known as: NORVASC  Take 1 tablet (10 mg total) by mouth daily.   aspirin  EC 81 MG tablet Take 1 tablet (81 mg total) by mouth daily. Swallow whole.   atorvastatin  80 MG tablet Commonly known as: LIPITOR  Take 1 tablet (80 mg total) by mouth daily.   bumetanide  2 MG tablet Commonly known as: BUMEX  Take 2 mg by mouth daily.   carvedilol  6.25 MG tablet Commonly known as: COREG  Take 1 tablet (6.25 mg total) by mouth 2 (two) times daily with a meal. What changed:  medication strength how much to take   clopidogrel  75 MG tablet Commonly known as: PLAVIX  Take 75 mg by mouth daily.   clotrimazole -betamethasone  cream Commonly known as: LOTRISONE  Apply 1 Application topically daily.   cyclobenzaprine  5 MG tablet Commonly known as: FLEXERIL  Take 1-2 tablets (5-10 mg total) by mouth 3 (three) times daily as needed for muscle spasms.   ezetimibe  10 MG tablet Commonly known as: ZETIA  Take 1 tablet (10 mg total) by mouth daily.   FreeStyle Libre 14 Day Sensor Misc 1 each by Does not apply route every 14 (fourteen) days.   HYDROcodone -acetaminophen  5-325 MG tablet Commonly known as: NORCO/VICODIN Take 1 tablet by mouth every 6 (six) hours  as needed.   hydrOXYzine  25 MG tablet Commonly known as: ATARAX  Take 1 tablet (25 mg total) by mouth every 6 (six) hours as needed for itching.   insulin  glargine 100 UNIT/ML injection Commonly known as: LANTUS  Inject 8 Units into the skin at bedtime.   insulin  lispro 100 UNIT/ML injection Commonly known as: HUMALOG Inject 2 Units into the skin 3 (three) times daily before meals.   ketoconazole  2 % cream Commonly known as: NIZORAL  Apply 1 Application topically daily.   lidocaine  5 % Commonly known as: LIDODERM  Place 1 patch onto the skin daily. Remove & Discard patch within 12 hours or as directed by MD   magnesium  oxide 400 MG tablet Commonly known as: MAG-OX Take 400 mg by mouth daily.   ondansetron  4 MG disintegrating tablet Commonly known as: ZOFRAN -ODT Take 1 tablet (4 mg total) by mouth every 8 (eight) hours as needed for nausea or vomiting.   pantoprazole  40 MG tablet Commonly known as: PROTONIX  Take 1 tablet (40  mg total) by mouth 2 (two) times daily as needed. Home med.   potassium chloride  SA 20 MEQ tablet Commonly known as: KLOR-CON  M TAKE 1 TABLET BY MOUTH DAILY   Probiotic 1-250 BILLION-MG Caps Take 1 capsule by mouth daily.   ranolazine  1000 MG SR tablet Commonly known as: RANEXA  Take 1 tablet (1,000 mg total) by mouth 2 (two) times daily.   senna 8.6 MG Tabs tablet Commonly known as: SENOKOT Take 1 tablet (8.6 mg total) by mouth at bedtime as needed.        Follow-up Information     Jama Margery ORN, MD. Go in 1 week(s).   Specialty: Cardiology Contact information: [address] [facility] [place] [address] [phone]                Allergies  Allergen Reactions   Losartan Other (See Comments)    Caused BP to drop and fainted   Caused BP to drop and fainted   Pantoprazole  Other (See Comments) and Swelling   Empagliflozin     Recurrent yeast infections   Compazine [Prochlorperazine]    Hydralazine  Other (See Comments)     Reaction Type: Side Effect; Reaction(s): racing thoughts Reaction Type: Side Effect; Reaction(s): racing thoughts Anxiety, racing thoughts Anxiety, racing thoughts    Lisinopril    Metoclopramide Other (See Comments)    Other reaction(s): Hallucinations   Naproxen    Nitroglycerin     Nsaids Hives    Reaction Type: Allergy; Severity: Mild   Tape Hives   Toradol [Ketorolac Tromethamine]    Tramadol    Duloxetine Hives   Wound Dressing Adhesive Other (See Comments)   Wound Dressings Other (See Comments)    Consultations:    Procedures/Studies: CT Angio Chest/Abd/Pel for Dissection W and/or Wo Contrast Result Date: 10/09/2023 CLINICAL DATA:  Chest pain for 1 day, initial encounter EXAM: CT ANGIOGRAPHY CHEST, ABDOMEN AND PELVIS TECHNIQUE: Non-contrast CT of the chest was initially obtained. Multidetector CT imaging through the chest, abdomen and pelvis was performed using the standard protocol during bolus administration of intravenous contrast. Multiplanar reconstructed images and MIPs were obtained and reviewed to evaluate the vascular anatomy. RADIATION DOSE REDUCTION: This exam was performed according to the departmental dose-optimization program which includes automated exposure control, adjustment of the mA and/or kV according to patient size and/or use of iterative reconstruction technique. CONTRAST:  80mL OMNIPAQUE  IOHEXOL  350 MG/ML SOLN COMPARISON:  Chest x-ray from earlier in the same day. FINDINGS: CTA CHEST FINDINGS Cardiovascular: Initial precontrast images demonstrate evidence of prior coronary bypass grafting. No hyperdense crescent is noted in the aorta to suggest acute aortic injury. Post-contrast images demonstrate no evidence of dissection. No aneurysmal dilatation is seen. The heart is not significantly enlarged. The pulmonary artery shows no evidence of pulmonary embolism although not timed for embolus evaluation. Changes of coronary bypass grafting are noted. Heavy  coronary calcifications are seen. Mediastinum/Nodes: Thoracic inlet is within normal limits. No hilar or mediastinal adenopathy is noted. The esophagus as visualized is within normal limits. Lungs/Pleura: The lungs are well aerated bilaterally. No focal infiltrate or sizable effusion is noted. No parenchymal nodule is seen. Musculoskeletal: Degenerative changes of the thoracic spine are noted. No acute rib abnormality is seen. Review of the MIP images confirms the above findings. CTA ABDOMEN AND PELVIS FINDINGS VASCULAR Aorta: Abdominal aorta demonstrates scattered atherosclerotic calcifications without aneurysmal dilatation or dissection. Celiac: Patent without evidence of aneurysm, dissection, vasculitis or significant stenosis. SMA: Patent without evidence of aneurysm,  dissection, vasculitis or significant stenosis. Renals: Mild calcifications are noted within the renal arteries. 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 IMA: Diminutive but patent. Inflow: Iliacs demonstrate atherosclerotic calcifications without aneurysmal dilatation or dissection. Veins: No specific venous abnormality is noted. Review of the MIP images confirms the above findings. NON-VASCULAR Hepatobiliary: Gallbladder is within normal limits. Liver shows no acute abnormality. Pancreas: Unremarkable. No pancreatic ductal dilatation or surrounding inflammatory changes. Spleen: Normal in size without focal abnormality. Adrenals/Urinary Tract: Adrenal glands are within normal limits. Kidneys are well visualized bilaterally. No renal calculi or obstructive changes are noted. The bladder is decompressed. Stomach/Bowel: No obstructive or inflammatory changes of the colon are noted. Scattered fecal material is seen throughout the colon. No appendiceal abnormality is noted. Small bowel and stomach are within normal limits. Lymphatic: No lymphadenopathy is seen. Reproductive: Status post hysterectomy. No adnexal masses.  Other: No abdominal wall hernia or abnormality. No abdominopelvic ascites. Musculoskeletal: Degenerative changes of lumbar spine are seen. Review of the MIP images confirms the above findings. IMPRESSION: CTA of the chest: No aortic injury is identified. No evidence of pulmonary emboli. No acute abnormality is seen. CTA of the abdomen and pelvis: No acute arterial abnormality is noted. No acute abnormality to correspond with the given clinical history. Electronically Signed   By: Oneil Devonshire M.D.   On: 10/09/2023 23:39   DG Chest Port 1 View Result Date: 10/09/2023 CLINICAL DATA:  Chest pain EXAM: PORTABLE CHEST 1 VIEW COMPARISON:  Chest x-ray 09/06/2023. FINDINGS: The heart is borderline enlarged. Patient is status post cardiac surgery. There is no pleural effusion or pneumothorax. Both lungs are clear. The visualized skeletal structures are unremarkable. IMPRESSION: No active disease. Borderline cardiomegaly. Electronically Signed   By: Greig Pique M.D.   On: 10/09/2023 22:49      Discharge Exam: Vitals:   10/10/23 0817 10/10/23 0950  BP: 123/64 123/64  Pulse: (!) 58   Resp: 14   Temp: 97.9 F (36.6 C)   SpO2: 98%    Vitals:   10/10/23 0636 10/10/23 0646 10/10/23 0817 10/10/23 0950  BP: (!) 140/79  123/64 123/64  Pulse:  65 (!) 58   Resp:   14   Temp:   97.9 F (36.6 C)   TempSrc:   Oral   SpO2:  100% 98%   Weight:      Height:        Constitutional:  Normal appearance. Non toxic-appearing.  HENT: Head Normocephalic and atraumatic.  Mucous membranes are moist.  Eyes:  Extraocular intact. Conjunctivae normal.  Cardiovascular: Rate and Rhythm: Normal rate and regular rhythm.  Pulmonary: Non labored, symmetric rise of chest wall.  Skin: warm and dry. not jaundiced.  Neurological: No focal deficit present. alert. Oriented.  Psychiatric: Mood and Affect congruent.    The results of significant diagnostics from this hospitalization (including imaging, microbiology, ancillary  and laboratory) are listed below for reference.     Microbiology: No results found for this or any previous visit (from the past 240 hours).   Labs: BNP (last 3 results) No results for input(s): BNP in the last 8760 hours. Basic Metabolic Panel: Recent Labs  Lab 10/09/23 2046 10/10/23 0038 10/10/23 0701  NA 137  --  140  K 3.1*  --  3.9  CL 100  --  103  CO2 26  --  28  GLUCOSE 179*  --  134*  BUN 23*  --  18  CREATININE  1.92*  --  1.18*  CALCIUM  9.7  --  9.2  MG  --  2.1  --    Liver Function Tests: Recent Labs  Lab 10/09/23 2045  AST 19  ALT 12  ALKPHOS 81  BILITOT 0.9  PROT 8.1  ALBUMIN 4.1   Recent Labs  Lab 10/09/23 2045  LIPASE 30   No results for input(s): AMMONIA in the last 168 hours. CBC: Recent Labs  Lab 10/09/23 2046 10/10/23 0426  WBC 7.5 7.7  HGB 12.8 12.6  HCT 37.1 37.6  MCV 84.5 87.6  PLT 287 242   Cardiac Enzymes: No results for input(s): CKTOTAL, CKMB, CKMBINDEX, TROPONINI in the last 168 hours. BNP: Invalid input(s): POCBNP CBG: Recent Labs  Lab 10/09/23 2113 10/10/23 0056 10/10/23 0758  GLUCAP 150* 124* 118*   D-Dimer No results for input(s): DDIMER in the last 72 hours. Hgb A1c No results for input(s): HGBA1C in the last 72 hours. Lipid Profile No results for input(s): CHOL, HDL, LDLCALC, TRIG, CHOLHDL, LDLDIRECT in the last 72 hours. Thyroid function studies No results for input(s): TSH, T4TOTAL, T3FREE, THYROIDAB in the last 72 hours.  Invalid input(s): FREET3 Anemia work up No results for input(s): VITAMINB12, FOLATE, FERRITIN, TIBC, IRON, RETICCTPCT in the last 72 hours. Urinalysis    Component Value Date/Time   COLORURINE YELLOW (A) 07/08/2023 2216   APPEARANCEUR HAZY (A) 07/08/2023 2216   LABSPEC 1.018 07/08/2023 2216   PHURINE 6.0 07/08/2023 2216   GLUCOSEU >=500 (A) 07/08/2023 2216   HGBUR NEGATIVE 07/08/2023 2216   BILIRUBINUR NEGATIVE 07/08/2023 2216    KETONESUR NEGATIVE 07/08/2023 2216   PROTEINUR NEGATIVE 07/08/2023 2216   NITRITE NEGATIVE 07/08/2023 2216   LEUKOCYTESUR NEGATIVE 07/08/2023 2216   Sepsis Labs Recent Labs  Lab 10/09/23 2046 10/10/23 0426  WBC 7.5 7.7   Microbiology No results found for this or any previous visit (from the past 240 hours).   Time coordinating discharge: 32 min  SIGNED: Zackaria Burkey, DO [facility] 10/10/2023, 2:35 PM Pager   If 7PM-7AM, please contact night-coverage

## 2023-10-10 NOTE — ED Notes (Signed)
 Youlanda Roys, MD at bedside

## 2023-10-10 NOTE — ED Notes (Signed)
   10/10/23 0952  Orthostatic Sitting  BP- Sitting (!) 151/93  Pulse- Sitting 68  Orthostatic Standing at 0 minutes  BP- Standing at 0 minutes (!) 189/106  Pulse- Standing at 0 minutes 66  Orthostatic Standing at 3 minutes  BP- Standing at 3 minutes (!) 205/97  Pulse- Standing at 3 minutes 59   Pt denied dizziness or lightheadness.

## 2023-10-20 ENCOUNTER — Encounter
Admit: 2023-10-20 | Discharge: 2023-10-20 | Payer: Medicare (Managed Care) | Attending: Student in an Organized Health Care Education/Training Program | Primary: Student in an Organized Health Care Education/Training Program

## 2023-10-20 DIAGNOSIS — N1831 CKD stage 3a, GFR 45-59 ml/min (CMS-HCC): Principal | ICD-10-CM

## 2023-10-20 DIAGNOSIS — Z23 Encounter for immunization: Principal | ICD-10-CM

## 2023-10-20 DIAGNOSIS — Z1239 Encounter for other screening for malignant neoplasm of breast: Principal | ICD-10-CM

## 2023-10-20 DIAGNOSIS — R001 Bradycardia, unspecified: Principal | ICD-10-CM

## 2023-10-20 DIAGNOSIS — I2510 Atherosclerotic heart disease of native coronary artery without angina pectoris: Principal | ICD-10-CM

## 2023-10-20 DIAGNOSIS — I10 Essential (primary) hypertension: Principal | ICD-10-CM

## 2023-10-20 DIAGNOSIS — E119 Type 2 diabetes mellitus without complications: Principal | ICD-10-CM

## 2023-10-20 DIAGNOSIS — Z1211 Encounter for screening for malignant neoplasm of colon: Principal | ICD-10-CM

## 2023-10-20 DIAGNOSIS — R079 Chest pain, unspecified: Principal | ICD-10-CM

## 2023-10-20 DIAGNOSIS — Z09 Encounter for follow-up examination after completed treatment for conditions other than malignant neoplasm: Principal | ICD-10-CM

## 2023-10-20 DIAGNOSIS — Z1231 Encounter for screening mammogram for malignant neoplasm of breast: Principal | ICD-10-CM

## 2023-10-20 DIAGNOSIS — E66813 Obesity, Class III, BMI 40-49.9 (morbid obesity): Principal | ICD-10-CM

## 2023-10-22 DIAGNOSIS — N1831 CKD stage 3a, GFR 45-59 ml/min (CMS-HCC): Principal | ICD-10-CM

## 2023-10-23 ENCOUNTER — Ambulatory Visit: Admit: 2023-10-23 | Payer: Medicare (Managed Care)

## 2023-10-24 ENCOUNTER — Ambulatory Visit
Admit: 2023-10-24 | Payer: Medicare (Managed Care) | Attending: Student in an Organized Health Care Education/Training Program | Primary: Student in an Organized Health Care Education/Training Program

## 2023-11-20 MED ORDER — SEMAGLUTIDE 1 MG/DOSE (4 MG/3 ML) SUBCUTANEOUS PEN INJECTOR
SUBCUTANEOUS | 3 refills | 28.00000 days | Status: CP
Start: 2023-11-20 — End: 2024-03-05

## 2023-11-27 ENCOUNTER — Encounter
Admit: 2023-11-27 | Discharge: 2023-11-27 | Payer: Medicare (Managed Care) | Attending: Student in an Organized Health Care Education/Training Program | Primary: Student in an Organized Health Care Education/Training Program

## 2023-11-27 DIAGNOSIS — E1159 Type 2 diabetes mellitus with other circulatory complications: Principal | ICD-10-CM

## 2023-11-27 MED ORDER — GABAPENTIN 300 MG CAPSULE
ORAL_CAPSULE | Freq: Three times a day (TID) | ORAL | 3 refills | 30.00000 days | Status: CP
Start: 2023-11-27 — End: 2024-11-26

## 2023-11-27 MED ORDER — SEMAGLUTIDE 1 MG/DOSE (4 MG/3 ML) SUBCUTANEOUS PEN INJECTOR
SUBCUTANEOUS | 3 refills | 28.00000 days | Status: CP
Start: 2023-11-27 — End: 2024-03-12

## 2023-11-29 DIAGNOSIS — R9431 Abnormal electrocardiogram [ECG] [EKG]: Principal | ICD-10-CM

## 2023-11-29 DIAGNOSIS — Z951 Presence of aortocoronary bypass graft: Principal | ICD-10-CM

## 2023-11-29 DIAGNOSIS — I5032 Chronic diastolic (congestive) heart failure: Principal | ICD-10-CM

## 2023-11-29 DIAGNOSIS — E7849 Other hyperlipidemia: Principal | ICD-10-CM

## 2023-11-29 DIAGNOSIS — E66813 Class 3 severe obesity due to excess calories with serious comorbidity in adult, unspecified BMI (CMS-HCC): Principal | ICD-10-CM

## 2023-11-29 DIAGNOSIS — E1165 Type 2 diabetes mellitus with hyperglycemia: Principal | ICD-10-CM

## 2023-11-29 DIAGNOSIS — Z9861 Coronary angioplasty status: Principal | ICD-10-CM

## 2023-11-29 DIAGNOSIS — I252 Old myocardial infarction: Principal | ICD-10-CM

## 2023-11-29 DIAGNOSIS — I10 Essential (primary) hypertension: Principal | ICD-10-CM

## 2023-11-29 MED ORDER — CARVEDILOL 6.25 MG TABLET
ORAL_TABLET | Freq: Two times a day (BID) | ORAL | 3 refills | 90.00000 days | Status: CP
Start: 2023-11-29 — End: 2024-11-23

## 2023-12-09 ENCOUNTER — Emergency Department
Admission: EM | Admit: 2023-12-09 | Discharge: 2023-12-09 | Disposition: A | Discharge status: Home / Self Care | Attending: Emergency Medicine | Admitting: Emergency Medicine

## 2023-12-09 ENCOUNTER — Other Ambulatory Visit: Payer: Self-pay

## 2023-12-09 ENCOUNTER — Encounter: Payer: Self-pay | Admitting: Emergency Medicine

## 2023-12-09 DIAGNOSIS — I509 Heart failure, unspecified: Secondary | ICD-10-CM | POA: Insufficient documentation

## 2023-12-09 DIAGNOSIS — R1013 Epigastric pain: Secondary | ICD-10-CM | POA: Insufficient documentation

## 2023-12-09 DIAGNOSIS — J449 Chronic obstructive pulmonary disease, unspecified: Secondary | ICD-10-CM | POA: Diagnosis not present

## 2023-12-09 DIAGNOSIS — I110 Hypertensive heart disease with heart failure: Secondary | ICD-10-CM | POA: Insufficient documentation

## 2023-12-09 DIAGNOSIS — I2510 Atherosclerotic heart disease of native coronary artery without angina pectoris: Secondary | ICD-10-CM | POA: Insufficient documentation

## 2023-12-09 DIAGNOSIS — T383X5A Adverse effect of insulin and oral hypoglycemic [antidiabetic] drugs, initial encounter: Secondary | ICD-10-CM | POA: Diagnosis not present

## 2023-12-09 DIAGNOSIS — R112 Nausea with vomiting, unspecified: Secondary | ICD-10-CM | POA: Insufficient documentation

## 2023-12-09 DIAGNOSIS — E119 Type 2 diabetes mellitus without complications: Secondary | ICD-10-CM | POA: Diagnosis not present

## 2023-12-09 DIAGNOSIS — T50905A Adverse effect of unspecified drugs, medicaments and biological substances, initial encounter: Secondary | ICD-10-CM

## 2023-12-09 LAB — CBC
HCT: 34.6 % — ABNORMAL LOW (ref 36.0–46.0)
Hemoglobin: 11.8 g/dL — ABNORMAL LOW (ref 12.0–15.0)
MCH: 29.8 pg (ref 26.0–34.0)
MCHC: 34.1 g/dL (ref 30.0–36.0)
MCV: 87.4 fL (ref 80.0–100.0)
Platelets: 234 K/uL (ref 150–400)
RBC: 3.96 MIL/uL (ref 3.87–5.11)
RDW: 13.4 % (ref 11.5–15.5)
WBC: 5.8 K/uL (ref 4.0–10.5)
nRBC: 0 % (ref 0.0–0.2)

## 2023-12-09 LAB — COMPREHENSIVE METABOLIC PANEL WITH GFR
ALT: 12 U/L (ref 0–44)
AST: 19 U/L (ref 15–41)
Albumin: 4.1 g/dL (ref 3.5–5.0)
Alkaline Phosphatase: 73 U/L (ref 38–126)
Anion gap: 13 (ref 5–15)
BUN: 19 mg/dL (ref 6–20)
CO2: 25 mmol/L (ref 22–32)
Calcium: 9.2 mg/dL (ref 8.9–10.3)
Chloride: 104 mmol/L (ref 98–111)
Creatinine, Ser: 1.04 mg/dL — ABNORMAL HIGH (ref 0.44–1.00)
GFR, Estimated: >60 mL/min (ref >60)
Glucose, Bld: 122 mg/dL — ABNORMAL HIGH (ref 70–99)
Potassium: 3.4 mmol/L — ABNORMAL LOW (ref 3.5–5.1)
Sodium: 142 mmol/L (ref 135–145)
Total Bilirubin: 0.6 mg/dL (ref 0.0–1.2)
Total Protein: 7.8 g/dL (ref 6.5–8.1)

## 2023-12-09 LAB — LIPASE, BLOOD: Lipase: 31 U/L (ref 11–51)

## 2023-12-09 MED ORDER — DIPHENHYDRAMINE HCL 50 MG/ML IJ SOLN
25.0000 mg | Freq: Once | INTRAMUSCULAR | Status: AC
  Administered 2023-12-09: 25 mg via INTRAVENOUS
  Filled 2023-12-09: qty 1

## 2023-12-09 MED ORDER — PROMETHAZINE HCL 25 MG PO TABS: 25.0000 mg | ORAL_TABLET | Freq: Three times a day (TID) | ORAL | 0 refills | Status: AC | PRN

## 2023-12-09 MED ORDER — DROPERIDOL 2.5 MG/ML IJ SOLN
1.2500 mg | Freq: Once | INTRAMUSCULAR | Status: AC
  Administered 2023-12-09: 1.25 mg via INTRAVENOUS
  Filled 2023-12-09: qty 2

## 2023-12-09 MED ORDER — PROMETHAZINE HCL 25 MG RE SUPP: 25.0000 mg | Freq: Four times a day (QID) | RECTAL | Status: DC | PRN

## 2023-12-09 MED ORDER — LACTATED RINGERS IV BOLUS
1000.0000 mL | Freq: Once | INTRAVENOUS | Status: AC
  Administered 2023-12-09: 1000 mL via INTRAVENOUS

## 2023-12-09 NOTE — ED Notes (Signed)
 Pt reports feeling much better and is ready to be discharged. MD notified

## 2023-12-09 NOTE — ED Triage Notes (Signed)
 Pt via POV from home. Pt c/o NV since Monday, reports she recently increased her dose of Ozempic. Denies any diarrhea. Denies fever. Pt is A&OX4 and NAD, ambulatory to triage.

## 2023-12-09 NOTE — ED Provider Notes (Signed)
 [facility] Provider Note   Event Date/Time   First MD Initiated Contact with Patient 12/09/23 1154     (approximate) History  Emesis  HPI Kayla Caldwell is a 48 y.o. female with a past medical history of heart failure, CAD, hypertension, diabetes, COPD, and hyperlipidemia who presents complaining of nausea/vomiting over the last 6 days after taking an increased dose of her Ozempic.  Patient states that since this last Monday, using increased dose of her Ozempic, she has had intractable nausea/vomiting to anything that she tries to take p.o.  Patient also endorses epigastric abdominal pain that began over these last 6 days. ROS: Patient currently denies any vision changes, tinnitus, difficulty speaking, facial droop, sore throat, chest pain, shortness of breath, diarrhea, dysuria, or weakness/numbness/paresthesias in any extremity   Physical Exam  Triage Vital Signs: ED Triage Vitals  Encounter Vitals Group     BP 12/09/23 1153 (!) 193/91     Girls Systolic BP Percentile --      Girls Diastolic BP Percentile --      Boys Systolic BP Percentile --      Boys Diastolic BP Percentile --      Pulse Rate 12/09/23 1153 66     Resp 12/09/23 1153 18     Temp 12/09/23 1153 98.4 F (36.9 C)     Temp Source 12/09/23 1153 Oral     SpO2 12/09/23 1153 100 %     Weight 12/09/23 1151 227 lb (103 kg)     Height 12/09/23 1151 5' 3 (1.6 m)     Head Circumference --      Peak Flow --      Pain Score 12/09/23 1151 10     Pain Loc --      Pain Education --      Exclude from Growth Chart --    Most recent vital signs: Vitals:   12/09/23 1218 12/09/23 1230  BP: (!) 156/73 (!) 149/74  Pulse: 66 78  Resp: 11 (!) 23  Temp:    SpO2: 100% 100%   General: Awake, oriented x4. CV:  Good peripheral perfusion. Resp:  Normal effort. Abd:  No distention.  Mild epigastric tenderness to palpation Other:  Middle-aged obese African-American female resting comfortably in no  acute distress ED Results / Procedures / Treatments  Labs (all labs ordered are listed, but only abnormal results are displayed) Labs Reviewed  COMPREHENSIVE METABOLIC PANEL WITH GFR - Abnormal; Notable for the following components:      Result Value   Potassium 3.4 (*)    Glucose, Bld 122 (*)    Creatinine, Ser 1.04 (*)    All other components within normal limits  CBC - Abnormal; Notable for the following components:   Hemoglobin 11.8 (*)    HCT 34.6 (*)    All other components within normal limits  LIPASE, BLOOD  URINALYSIS, ROUTINE W REFLEX MICROSCOPIC   PROCEDURES: Critical Care performed: No Procedures MEDICATIONS ORDERED IN ED: Medications  promethazine  (PHENERGAN ) suppository 25 mg (has no administration in time range)  lactated ringers bolus 1,000 mL (0 mLs Intravenous Stopped 12/09/23 1412)  droperidol  (INAPSINE ) 2.5 MG/ML injection 1.25 mg (1.25 mg Intravenous Given 12/09/23 1214)  diphenhydrAMINE  (BENADRYL ) injection 25 mg (25 mg Intravenous Given 12/09/23 1254)   IMPRESSION / MDM / ASSESSMENT AND PLAN / ED COURSE  I reviewed the triage vital signs and the nursing notes.  The patient is on the cardiac monitor to evaluate for evidence of arrhythmia and/or significant heart rate changes. Patient's presentation is most consistent with acute presentation with potential threat to life or bodily function. Patient 48 year old female with above-stated past medical history that presents complaining of nausea/vomiting over the last 6 days with associated epigastric abdominal pain.  The symptoms came after an increased dose of Ozempic, DDx: Gastroenteritis, biliary disease, medication side effect, small bowel obstruction Plan: CBC, CMP, lipase, UA, EKG  Tx: Droperidol , Benadryl , LR  Upon reassessment, patient is p.o. tolerant.  Patient's feels well and is requesting discharge at this time.  Patient given strict return precautions and all questions  answered prior to discharge.  Dispo: Discharge home with PCP follow-up   FINAL CLINICAL IMPRESSION(S) / ED DIAGNOSES   Final diagnoses:  Nausea and vomiting, unspecified vomiting type  Epigastric pain  Medication side effect, initial encounter   Rx / DC Orders   ED Discharge Orders          Ordered    promethazine  (PHENERGAN ) 25 MG tablet  Every 8 hours PRN        12/09/23 1405           Note:  This document was prepared using Dragon voice recognition software and may include unintentional dictation errors.   April Carlyon K, MD 12/09/23 [phone]

## 2024-01-01 DIAGNOSIS — J440 Chronic obstructive pulmonary disease with acute lower respiratory infection: Principal | ICD-10-CM

## 2024-01-01 DIAGNOSIS — R058 Nonproductive cough: Principal | ICD-10-CM

## 2024-01-01 DIAGNOSIS — R062 Wheezing: Principal | ICD-10-CM

## 2024-01-01 DIAGNOSIS — R52 Pain, unspecified: Principal | ICD-10-CM

## 2024-01-01 MED ORDER — ALBUTEROL SULFATE HFA 90 MCG/ACTUATION AEROSOL INHALER
RESPIRATORY_TRACT | 1 refills | 0.00000 days | Status: CP | PRN
Start: 2024-01-01 — End: 2024-12-31

## 2024-01-01 MED ORDER — BENZONATATE 200 MG CAPSULE
ORAL_CAPSULE | Freq: Three times a day (TID) | ORAL | 0 refills | 7.00000 days | Status: CP | PRN
Start: 2024-01-01 — End: 2024-01-08

## 2024-01-19 ENCOUNTER — Encounter
Admit: 2024-01-19 | Discharge: 2024-01-19 | Payer: Medicare (Managed Care) | Attending: Student in an Organized Health Care Education/Training Program | Primary: Student in an Organized Health Care Education/Training Program

## 2024-01-19 DIAGNOSIS — Z130 Encounter for screening for diseases of the blood and blood-forming organs and certain disorders involving the immune mechanism: Principal | ICD-10-CM

## 2024-01-19 DIAGNOSIS — E559 Vitamin D deficiency, unspecified: Principal | ICD-10-CM

## 2024-01-19 DIAGNOSIS — M5412 Radiculopathy, cervical region: Principal | ICD-10-CM

## 2024-01-19 DIAGNOSIS — Z1211 Encounter for screening for malignant neoplasm of colon: Principal | ICD-10-CM

## 2024-01-19 DIAGNOSIS — Z1231 Encounter for screening mammogram for malignant neoplasm of breast: Principal | ICD-10-CM

## 2024-01-19 DIAGNOSIS — J440 Chronic obstructive pulmonary disease with acute lower respiratory infection: Principal | ICD-10-CM

## 2024-01-19 DIAGNOSIS — I10 Essential (primary) hypertension: Principal | ICD-10-CM

## 2024-01-19 DIAGNOSIS — R799 Abnormal finding of blood chemistry, unspecified: Principal | ICD-10-CM

## 2024-01-19 DIAGNOSIS — D126 Benign neoplasm of colon, unspecified: Principal | ICD-10-CM

## 2024-01-19 DIAGNOSIS — Z1239 Encounter for other screening for malignant neoplasm of breast: Principal | ICD-10-CM

## 2024-01-19 DIAGNOSIS — N1831 CKD stage 3a, GFR 45-59 ml/min (CMS-HCC): Principal | ICD-10-CM

## 2024-01-19 DIAGNOSIS — R001 Bradycardia, unspecified: Principal | ICD-10-CM

## 2024-01-23 MED ORDER — CHLORTHALIDONE 25 MG TABLET
ORAL_TABLET | 1 refills | 0.00000 days
Start: 2024-01-23 — End: ?

## 2024-01-23 MED ORDER — ERGOCALCIFEROL (VITAMIN D2) 1,250 MCG (50,000 UNIT) CAPSULE
ORAL_CAPSULE | ORAL | 1 refills | 84.00000 days | Status: CP
Start: 2024-01-23 — End: 2024-07-21

## 2024-01-24 MED ORDER — CHLORTHALIDONE 25 MG TABLET
ORAL_TABLET | 1 refills | 0.00000 days
Start: 2024-01-24 — End: ?

## 2024-01-28 DIAGNOSIS — R062 Wheezing: Principal | ICD-10-CM

## 2024-01-28 DIAGNOSIS — J440 Chronic obstructive pulmonary disease with acute lower respiratory infection: Principal | ICD-10-CM

## 2024-01-28 DIAGNOSIS — R058 Nonproductive cough: Principal | ICD-10-CM

## 2024-01-28 MED ORDER — ALBUTEROL SULFATE HFA 90 MCG/ACTUATION AEROSOL INHALER
RESPIRATORY_TRACT | 0.00000 days | PRN
Start: 2024-01-28 — End: ?

## 2024-02-01 MED ORDER — ALBUTEROL SULFATE HFA 90 MCG/ACTUATION AEROSOL INHALER
RESPIRATORY_TRACT | 0 refills | 0.00000 days | Status: CP | PRN
Start: 2024-02-01 — End: ?

## 2024-02-12 ENCOUNTER — Inpatient Hospital Stay: Admit: 2024-02-12 | Discharge: 2024-02-12 | Payer: Medicare (Managed Care)

## 2024-02-14 ENCOUNTER — Inpatient Hospital Stay: Admit: 2024-02-14 | Discharge: 2024-02-15 | Payer: Medicare (Managed Care)

## 2024-02-20 ENCOUNTER — Encounter
Admit: 2024-02-20 | Discharge: 2024-02-20 | Payer: Medicare (Managed Care) | Attending: Student in an Organized Health Care Education/Training Program | Primary: Student in an Organized Health Care Education/Training Program

## 2024-02-20 DIAGNOSIS — G4730 Sleep apnea, unspecified: Principal | ICD-10-CM

## 2024-02-20 DIAGNOSIS — R399 Unspecified symptoms and signs involving the genitourinary system: Principal | ICD-10-CM

## 2024-02-20 DIAGNOSIS — F5101 Primary insomnia: Principal | ICD-10-CM

## 2024-02-20 DIAGNOSIS — E119 Type 2 diabetes mellitus without complications: Principal | ICD-10-CM

## 2024-02-20 DIAGNOSIS — N1831 CKD stage 3a, GFR 45-59 ml/min (CMS-HCC): Principal | ICD-10-CM

## 2024-02-20 DIAGNOSIS — I10 Essential (primary) hypertension: Principal | ICD-10-CM

## 2024-02-20 DIAGNOSIS — J440 Chronic obstructive pulmonary disease with acute lower respiratory infection: Principal | ICD-10-CM

## 2024-02-20 DIAGNOSIS — R001 Bradycardia, unspecified: Principal | ICD-10-CM

## 2024-02-20 DIAGNOSIS — N898 Other specified noninflammatory disorders of vagina: Principal | ICD-10-CM

## 2024-02-20 DIAGNOSIS — L299 Pruritus, unspecified: Principal | ICD-10-CM

## 2024-02-20 MED ORDER — PROMETHAZINE 25 MG TABLET
ORAL_TABLET | Freq: Two times a day (BID) | ORAL | 0 refills | 15.00000 days | Status: CP | PRN
Start: 2024-02-20 — End: ?

## 2024-02-20 MED ORDER — HYDROXYZINE HCL 25 MG TABLET
ORAL_TABLET | Freq: Every day | ORAL | 2 refills | 30.00000 days | Status: CP | PRN
Start: 2024-02-20 — End: 2024-05-20

## 2024-02-21 DIAGNOSIS — N3000 Acute cystitis without hematuria: Principal | ICD-10-CM

## 2024-02-21 DIAGNOSIS — A599 Trichomoniasis, unspecified: Principal | ICD-10-CM

## 2024-02-21 MED ORDER — SULFAMETHOXAZOLE 400 MG-TRIMETHOPRIM 80 MG TABLET
ORAL_TABLET | Freq: Two times a day (BID) | ORAL | 0 refills | 3.00000 days | Status: CP
Start: 2024-02-21 — End: 2024-02-24

## 2024-02-21 MED ORDER — METRONIDAZOLE 500 MG TABLET
ORAL_TABLET | Freq: Two times a day (BID) | ORAL | 0 refills | 7.00000 days | Status: CP
Start: 2024-02-21 — End: 2024-02-28

## 2024-02-26 DIAGNOSIS — E1159 Type 2 diabetes mellitus with other circulatory complications: Principal | ICD-10-CM

## 2024-02-26 DIAGNOSIS — E079 Disorder of thyroid, unspecified: Principal | ICD-10-CM

## 2024-02-26 MED ORDER — LANCETS
12 refills | 0.00000 days | Status: CP
Start: 2024-02-26 — End: ?

## 2024-02-26 MED ORDER — BLOOD SUGAR DIAGNOSTIC STRIPS
ORAL_STRIP | 12 refills | 0.00000 days | Status: CP
Start: 2024-02-26 — End: ?

## 2024-02-26 MED ORDER — OZEMPIC 0.25 MG OR 0.5 MG (2 MG/3 ML) SUBCUTANEOUS PEN INJECTOR
SUBCUTANEOUS | 3 refills | 0.00000 days | Status: CP
Start: 2024-02-26 — End: ?

## 2024-02-26 MED ORDER — BLOOD-GLUCOSE METER KIT WRAPPER
ORAL | 0 refills | 0.00000 days | Status: CP
Start: 2024-02-26 — End: ?

## 2024-03-06 ENCOUNTER — Ambulatory Visit: Admit: 2024-03-06 | Discharge: 2024-03-07 | Payer: Medicare (Managed Care)

## 2024-03-19 MED ORDER — FLUCONAZOLE 200 MG TABLET
ORAL_TABLET | Freq: Every day | ORAL | 0.00000 days
Start: 2024-03-19 — End: ?

## 2024-03-20 MED ORDER — FLUCONAZOLE 200 MG TABLET
ORAL_TABLET | Freq: Every day | ORAL | 5.00000 days
Start: 2024-03-20 — End: ?

## 2024-03-21 ENCOUNTER — Ambulatory Visit: Admit: 2024-03-21 | Payer: Medicare (Managed Care)

## 2024-04-01 DIAGNOSIS — E1165 Type 2 diabetes mellitus with hyperglycemia: Secondary | ICD-10-CM

## 2024-04-01 DIAGNOSIS — B379 Candidiasis, unspecified: Secondary | ICD-10-CM

## 2024-04-01 DIAGNOSIS — E66813 Class 3 severe obesity due to excess calories with serious comorbidity in adult, unspecified BMI (CMS-HCC): Secondary | ICD-10-CM

## 2024-04-01 DIAGNOSIS — R9431 Abnormal electrocardiogram [ECG] [EKG]: Secondary | ICD-10-CM

## 2024-04-01 DIAGNOSIS — I252 Old myocardial infarction: Secondary | ICD-10-CM

## 2024-04-01 DIAGNOSIS — Z9861 Coronary angioplasty status: Principal | ICD-10-CM

## 2024-04-01 DIAGNOSIS — Z951 Presence of aortocoronary bypass graft: Secondary | ICD-10-CM

## 2024-04-01 DIAGNOSIS — I10 Essential (primary) hypertension: Secondary | ICD-10-CM

## 2024-04-01 DIAGNOSIS — E7849 Other hyperlipidemia: Secondary | ICD-10-CM

## 2024-04-01 MED ORDER — FLUCONAZOLE 150 MG TABLET
ORAL_TABLET | Freq: Once | ORAL | 0 refills | 1.00000 days | Status: CP
Start: 2024-04-01 — End: 2024-04-01

## 2024-04-01 MED ORDER — ASCORBIC ACID (VITAMIN C) 250 MG CHEWABLE TABLET
ORAL_TABLET | Freq: Every day | ORAL | 1 refills | 90.00000 days | Status: CP
Start: 2024-04-01 — End: ?
